# Patient Record
Sex: Female | Born: 1990
Health system: Southern US, Community
[De-identification: ages and names within clinical notes are randomized; demographics above are authoritative.]

## PROBLEM LIST (undated history)

## (undated) ENCOUNTER — Emergency Department (HOSPITAL_COMMUNITY)
Admission: RE | Disposition: A | Discharge status: Home / Self Care | Payer: BC Managed Care – PPO | Source: Ambulatory Visit | Attending: Emergency Medicine | Admitting: Emergency Medicine

## (undated) ENCOUNTER — Emergency Department (HOSPITAL_COMMUNITY): Discharge status: Absent without leave | Payer: No Typology Code available for payment source

## (undated) DIAGNOSIS — F32A Depression, unspecified: Secondary | ICD-10-CM

## (undated) DIAGNOSIS — J301 Allergic rhinitis due to pollen: Secondary | ICD-10-CM

## (undated) DIAGNOSIS — N390 Urinary tract infection, site not specified: Secondary | ICD-10-CM

## (undated) DIAGNOSIS — F329 Major depressive disorder, single episode, unspecified: Secondary | ICD-10-CM

## (undated) DIAGNOSIS — J45909 Unspecified asthma, uncomplicated: Secondary | ICD-10-CM

## (undated) DIAGNOSIS — A749 Chlamydial infection, unspecified: Secondary | ICD-10-CM

## (undated) HISTORY — DX: Allergic rhinitis due to pollen: J30.1

## (undated) HISTORY — PX: TONSILLECTOMY: SUR1361

## (undated) HISTORY — DX: Major depressive disorder, single episode, unspecified: F32.9

## (undated) HISTORY — DX: Chlamydial infection, unspecified: A74.9

## (undated) HISTORY — DX: Depression, unspecified: F32.A

---
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
## 2011-01-26 LAB — URINALYSIS, ROUTINE W REFLEX MICROSCOPIC
Bilirubin Urine: NEGATIVE
Glucose, UA: NEGATIVE
Ketones, ur: NEGATIVE
Leukocytes, UA: NEGATIVE
Nitrite: NEGATIVE
Protein, ur: NEGATIVE
Specific Gravity, Urine: 1.007
Urobilinogen, UA: 0.2
pH: 7.5

## 2011-01-26 LAB — URINE MICROSCOPIC-ADD ON

## 2011-01-26 LAB — BASIC METABOLIC PANEL
BUN: 9
CO2: 29
Calcium: 9.7
Chloride: 107
Creatinine, Ser: 0.55
Glucose, Bld: 92
Potassium: 3.8
Sodium: 144

## 2011-01-26 LAB — PREGNANCY, URINE: Preg Test, Ur: NEGATIVE

## 2011-04-04 ENCOUNTER — Emergency Department (HOSPITAL_COMMUNITY)
Admission: EM | Admit: 2011-04-04 | Discharge: 2011-04-05 | Disposition: A | Discharge status: Home / Self Care | Payer: Self-pay | Attending: Emergency Medicine | Admitting: Emergency Medicine

## 2011-04-04 DIAGNOSIS — N390 Urinary tract infection, site not specified: Secondary | ICD-10-CM | POA: Insufficient documentation

## 2011-04-04 DIAGNOSIS — R10819 Abdominal tenderness, unspecified site: Secondary | ICD-10-CM | POA: Insufficient documentation

## 2011-04-04 HISTORY — DX: Urinary tract infection, site not specified: N39.0

## 2011-04-05 ENCOUNTER — Encounter: Payer: Self-pay | Admitting: *Deleted

## 2011-04-05 LAB — URINALYSIS, ROUTINE W REFLEX MICROSCOPIC
Glucose, UA: NEGATIVE mg/dL
Ketones, ur: NEGATIVE mg/dL
Nitrite: NEGATIVE
Protein, ur: 30 mg/dL — AB
Urobilinogen, UA: 1 mg/dL (ref 0.0–1.0)

## 2011-04-05 LAB — URINE MICROSCOPIC-ADD ON

## 2011-04-05 MED ORDER — SULFAMETHOXAZOLE-TRIMETHOPRIM 800-160 MG PO TABS: 1.0000 | ORAL_TABLET | Freq: Two times a day (BID) | ORAL | Status: AC

## 2011-04-05 NOTE — ED Provider Notes (Signed)
History     CSN: [id number] Arrival date & time: 04/04/2011 11:08 PM   First MD Initiated Contact with Patient 04/05/11 0204      Chief Complaint  Patient presents with  . Urinary Tract Infection    (Consider location/radiation/quality/duration/timing/severity/associated sxs/prior treatment) HPI Comments: 20 year old female with a history of 3 days of dysuria, suprapubic pain. Symptoms are intermittent, persistent over time, not associated with fevers, chills, nausea, vomiting. She has taken no medications prior to arrival. She cites a history of having urinary infections as a child but nothing recently. Symptoms are mild at this time  Patient is a 20 y.o. female presenting with urinary tract infection. The history is provided by the patient and a relative.  Urinary Tract Infection    Past Medical History  Diagnosis Date  . UTI (urinary tract infection)     History reviewed. No pertinent past surgical history.  History reviewed. No pertinent family history.  History  Substance Use Topics  . Smoking status: Not on file  . Smokeless tobacco: Not on file  . Alcohol Use:     OB History    Grav Para Term Preterm Abortions TAB SAB Ect Mult Living                  Review of Systems  All other systems reviewed and are negative.    Allergies  Penicillins  Home Medications   Current Outpatient Rx  Name Route Sig Dispense Refill  . DIPHENHYDRAMINE-APAP (SLEEP) 25-500 MG PO TABS Oral Take 1 tablet by mouth at bedtime as needed.      Marland Kitchen MEDROXYPROGESTERONE ACETATE 150 MG/ML IM SUSP Intramuscular Inject 150 mg into the muscle every 3 (three) months.      . SULFAMETHOXAZOLE-TRIMETHOPRIM 800-160 MG PO TABS Oral Take 1 tablet by mouth 2 (two) times daily. 14 tablet 0    BP 108/45  Pulse 69  Temp(Src) 98 F (36.7 C) (Oral)  Resp 18  Ht 5\' 5"  (1.651 m)  Wt 169 lb (76.658 kg)  BMI 28.12 kg/m2  SpO2 100%  Physical Exam  Nursing note and vitals  reviewed. Constitutional: She appears well-developed and well-nourished. No distress.  HENT:  Head: Normocephalic and atraumatic.  Mouth/Throat: Oropharynx is clear and moist. No oropharyngeal exudate.  Eyes: Conjunctivae and EOM are normal. Pupils are equal, round, and reactive to light. Right eye exhibits no discharge. Left eye exhibits no discharge. No scleral icterus.  Neck: Normal range of motion. Neck supple. No JVD present. No thyromegaly present.  Cardiovascular: Normal rate, regular rhythm, normal heart sounds and intact distal pulses.  Exam reveals no gallop and no friction rub.   No murmur heard. Pulmonary/Chest: Effort normal and breath sounds normal. No respiratory distress. She has no wheezes. She has no rales.  Abdominal: Soft. Bowel sounds are normal. She exhibits no distension and no mass. There is tenderness ( Mild suprapubic tenderness).  Musculoskeletal: Normal range of motion. She exhibits no edema and no tenderness.  Lymphadenopathy:    She has no cervical adenopathy.  Neurological: She is alert. Coordination normal.  Skin: Skin is warm and dry. No rash noted. No erythema.  Psychiatric: She has a normal mood and affect. Her behavior is normal.    ED Course  Procedures (including critical care time)  Labs Reviewed  URINALYSIS, ROUTINE W REFLEX MICROSCOPIC - Abnormal; Notable for the following:    APPearance TURBID (*)    Protein, ur 30 (*)    Leukocytes, UA MODERATE (*)  All other components within normal limits  URINE MICROSCOPIC-ADD ON - Abnormal; Notable for the following:    Bacteria, UA MANY (*)    All other components within normal limits  POCT PREGNANCY, URINE  POCT PREGNANCY, URINE   No results found.   1. UTI (lower urinary tract infection)       MDM  UTI - no CVA ttp, no abn VS, bactrim X 7 days.        Vida Roller, MD 04/05/11 [phone]

## 2011-04-05 NOTE — ED Notes (Signed)
Pt has been experiencing UTI like symptoms x3 days that include burning, urgency, and pain with standing.

## 2011-09-27 ENCOUNTER — Encounter (HOSPITAL_BASED_OUTPATIENT_CLINIC_OR_DEPARTMENT_OTHER): Payer: Self-pay | Admitting: Emergency Medicine

## 2011-09-27 ENCOUNTER — Emergency Department (HOSPITAL_BASED_OUTPATIENT_CLINIC_OR_DEPARTMENT_OTHER)
Admission: EM | Admit: 2011-09-27 | Discharge: 2011-09-27 | Disposition: A | Discharge status: Home / Self Care | Payer: Self-pay | Attending: Emergency Medicine | Admitting: Emergency Medicine

## 2011-09-27 DIAGNOSIS — J069 Acute upper respiratory infection, unspecified: Secondary | ICD-10-CM | POA: Insufficient documentation

## 2011-09-27 DIAGNOSIS — F17200 Nicotine dependence, unspecified, uncomplicated: Secondary | ICD-10-CM | POA: Insufficient documentation

## 2011-09-27 DIAGNOSIS — J45909 Unspecified asthma, uncomplicated: Secondary | ICD-10-CM | POA: Insufficient documentation

## 2011-09-27 HISTORY — DX: Unspecified asthma, uncomplicated: J45.909

## 2011-09-27 LAB — BASIC METABOLIC PANEL
CO2: 23 mEq/L (ref 19–32)
Calcium: 8.9 mg/dL (ref 8.4–10.5)
GFR calc non Af Amer: >90 mL/min (ref >90)
Glucose, Bld: 88 mg/dL (ref 70–99)
Potassium: 3.1 mEq/L — ABNORMAL LOW (ref 3.5–5.1)
Sodium: 137 mEq/L (ref 135–145)

## 2011-09-27 MED ORDER — POTASSIUM CHLORIDE CRYS ER 20 MEQ PO TBCR
40.0000 meq | EXTENDED_RELEASE_TABLET | Freq: Once | ORAL | Status: AC
  Administered 2011-09-27: 40 meq via ORAL
  Filled 2011-09-27: qty 2

## 2011-09-27 MED ORDER — SODIUM CHLORIDE 0.9 % IV BOLUS (SEPSIS)
1000.0000 mL | Freq: Once | INTRAVENOUS | Status: AC
  Administered 2011-09-27: 1000 mL via INTRAVENOUS

## 2011-09-27 MED ORDER — KETOROLAC TROMETHAMINE 30 MG/ML IJ SOLN
30.0000 mg | Freq: Once | INTRAMUSCULAR | Status: AC
  Administered 2011-09-27: 30 mg via INTRAVENOUS
  Filled 2011-09-27: qty 1

## 2011-09-27 NOTE — ED Notes (Signed)
Runny nose, nasal congestion, fever, sore throat, headache, since yesterday.  No coughing.  Generalized fatigue.

## 2011-09-27 NOTE — ED Provider Notes (Signed)
History     CSN: [id number]  Arrival date & time 09/27/11  1043   First MD Initiated Contact with Patient 09/27/11 1110      Chief Complaint  Patient presents with  . Nasal Congestion  . Headache  . Fever    (Consider location/radiation/quality/duration/timing/severity/associated sxs/prior treatment) HPI Patient is a 21 yo female who presents with 24 hours of congestion, sore throat, myalgias, and headache.  She has no known sick contacts and rates her discomfort as a 5/10. Patient took Tylenol last night without much relief. She has had no medications today. She denies fevers. She has had some nausea but denies any currently. She denies vomiting, diarrhea, or significant cough. Patient has history of strep throat and says this does not feel exactly the same. She's having no difficulty with swallowing or breathing though she does have pain with swallowing. There are no other associated or modifying factors. Past Medical History  Diagnosis Date  . UTI (urinary tract infection)   . Asthma     Past Surgical History  Procedure Date  . Tonsillectomy     History reviewed. No pertinent family history.  History  Substance Use Topics  . Smoking status: Current Everyday Smoker  . Smokeless tobacco: Not on file  . Alcohol Use: No    OB History    Grav Para Term Preterm Abortions TAB SAB Ect Mult Living                  Review of Systems  Constitutional: Positive for fatigue.  HENT: Positive for congestion and sore throat.   Eyes: Negative.   Respiratory: Negative.   Cardiovascular: Negative.   Gastrointestinal: Positive for nausea.  Genitourinary: Negative.   Musculoskeletal: Positive for myalgias.  Skin: Negative.   Neurological: Negative.   Hematological: Negative.   Psychiatric/Behavioral: Negative.   All other systems reviewed and are negative.    Allergies  Penicillins  Home Medications   Current Outpatient Rx  Name Route Sig Dispense Refill  .  ACETAMINOPHEN 325 MG PO TABS Oral Take 650 mg by mouth every 6 (six) hours as needed.    . ALBUTEROL SULFATE HFA 108 (90 BASE) MCG/ACT IN AERS Inhalation Inhale 2 puffs into the lungs every 6 (six) hours as needed.    Marland Kitchen DIPHENHYDRAMINE-APAP (SLEEP) 25-500 MG PO TABS Oral Take 1 tablet by mouth at bedtime as needed.      Marland Kitchen MEDROXYPROGESTERONE ACETATE 150 MG/ML IM SUSP Intramuscular Inject 150 mg into the muscle every 3 (three) months.        BP 110/58  Pulse 100  Temp(Src) 99.9 F (37.7 C) (Oral)  Resp 18  SpO2 100%  Physical Exam  Nursing note and vitals reviewed. GEN: Well-developed, well-nourished female in no distress, uncomfortable appearing HEENT: Atraumatic, normocephalic. Oropharynx clear with erythema but no obvious exudates. Nasal congestion noted bilaterally. EYES: PERRLA BL, no scleral icterus. NECK: Trachea midline, no meningismus CV: regular rate and rhythm. No murmurs, rubs, or gallops PULM: No respiratory distress.  No crackles, wheezes, or rales. GI: soft, non-tender. No guarding, rebound, or tenderness. + bowel sounds  GU: deferred Neuro: cranial nerves 2-12 intact, no abnormalities of strength or sensation, A and O x 3 MSK: Patient moves all 4 extremities symmetrically, no deformity, edema, or injury noted Skin: No rashes petechiae, purpura, or jaundice Psych: no abnormality of mood   ED Course  Procedures (including critical care time)  Labs Reviewed  BASIC METABOLIC PANEL - Abnormal; Notable for the following:  Potassium 3.1 (*)    BUN 5 (*)    All other components within normal limits  RAPID STREP SCREEN   No results found.   1. Acute URI       MDM  Patient was evaluated by myself. Based on her evaluation I did perform a strep test given how rapidly her symptoms came on and her sore throat. This was negative. Patient did receive IV fluid as well as Toradol for her symptoms. I confirmed her renal function was satisfactory. Patient had mild  hypokalemia and this was replaced orally. Patient was feeling much better after a liter of fluid. She was discharged in good condition. Patient was told symptoms may last 7-10 days and that she should take over-the-counter ibuprofen and Tylenol for this. She was also encouraged to drink plenty of fluids. She is welcome to return for any other emergent concerns.        Cyndra Numbers, MD 09/27/11 1251

## 2011-09-27 NOTE — Discharge Instructions (Signed)

## 2011-10-03 ENCOUNTER — Encounter (HOSPITAL_BASED_OUTPATIENT_CLINIC_OR_DEPARTMENT_OTHER): Payer: Self-pay

## 2011-10-03 ENCOUNTER — Emergency Department (HOSPITAL_BASED_OUTPATIENT_CLINIC_OR_DEPARTMENT_OTHER)
Admission: EM | Admit: 2011-10-03 | Discharge: 2011-10-03 | Disposition: A | Discharge status: Home / Self Care | Payer: Self-pay | Attending: Emergency Medicine | Admitting: Emergency Medicine

## 2011-10-03 DIAGNOSIS — J45909 Unspecified asthma, uncomplicated: Secondary | ICD-10-CM | POA: Insufficient documentation

## 2011-10-03 DIAGNOSIS — Z880 Allergy status to penicillin: Secondary | ICD-10-CM | POA: Insufficient documentation

## 2011-10-03 DIAGNOSIS — R040 Epistaxis: Secondary | ICD-10-CM | POA: Insufficient documentation

## 2011-10-03 DIAGNOSIS — E876 Hypokalemia: Secondary | ICD-10-CM

## 2011-10-03 DIAGNOSIS — J3489 Other specified disorders of nose and nasal sinuses: Secondary | ICD-10-CM | POA: Insufficient documentation

## 2011-10-03 DIAGNOSIS — F17200 Nicotine dependence, unspecified, uncomplicated: Secondary | ICD-10-CM | POA: Insufficient documentation

## 2011-10-03 LAB — BASIC METABOLIC PANEL
CO2: 26 mEq/L (ref 19–32)
Chloride: 104 mEq/L (ref 96–112)
Glucose, Bld: 73 mg/dL (ref 70–99)
Potassium: 3.1 mEq/L — ABNORMAL LOW (ref 3.5–5.1)
Sodium: 138 mEq/L (ref 135–145)

## 2011-10-03 LAB — RAPID STREP SCREEN (MED CTR MEBANE ONLY): Streptococcus, Group A Screen (Direct): NEGATIVE

## 2011-10-03 LAB — DIFFERENTIAL
Basophils Relative: 0 % (ref 0–1)
Eosinophils Relative: 3 % (ref 0–5)
Monocytes Relative: 10 % (ref 3–12)
Neutrophils Relative %: 49 % (ref 43–77)

## 2011-10-03 LAB — MONONUCLEOSIS SCREEN: Mono Screen: NEGATIVE

## 2011-10-03 LAB — CBC
Hemoglobin: 11.8 g/dL — ABNORMAL LOW (ref 12.0–15.0)
Platelets: 282 10*3/uL (ref 150–400)
RBC: 3.65 MIL/uL — ABNORMAL LOW (ref 3.87–5.11)
WBC: 9.5 10*3/uL (ref 4.0–10.5)

## 2011-10-03 LAB — PREGNANCY, URINE: Preg Test, Ur: NEGATIVE

## 2011-10-03 MED ORDER — POTASSIUM CHLORIDE CRYS ER 20 MEQ PO TBCR
40.0000 meq | EXTENDED_RELEASE_TABLET | Freq: Once | ORAL | Status: AC
  Administered 2011-10-03: 40 meq via ORAL
  Filled 2011-10-03: qty 2

## 2011-10-03 NOTE — Discharge Instructions (Signed)
Hypokalemia Hypokalemia means a low potassium level in the blood.Potassium is an electrolyte that helps regulate the amount of fluid in the body. It also stimulates muscle contraction and maintains a stable acid-base balance.Most of the body's potassium is inside of cells, and only a very small amount is in the blood. Because the amount in the blood is so small, minor changes can have big effects. PREPARATION FOR TEST Testing for potassium requires taking a blood sample taken by needle from a vein in the arm. The skin is cleaned thoroughly before the sample is drawn. There is no other special preparation needed. NORMAL VALUES Potassium levels below 3.5 mEq/L are abnormally low. Levels above 5.1 mEq/L are abnormally high. Ranges for normal findings may vary among different laboratories and hospitals. You should always check with your doctor after having lab work or other tests done to discuss the meaning of your test results and whether your values are considered within normal limits. MEANING OF TEST  Your caregiver will go over the test results with you and discuss the importance and meaning of your results, as well as treatment options and the need for additional tests, if necessary. A potassium level is frequently part of a routine medical exam. It is usually included as part of a whole "panel" of tests for several blood salts (such as Sodium and Chloride). It may be done as part of follow-up when a low potassium level was found in the past or other blood salts are suspected of being out of balance. A low potassium level might be suspected if you have one or more of the following:  Symptoms of weakness.   Abnormal heart rhythms.   High blood pressure and are taking medication to control this, especially water pills (diuretics).   Kidney disease that can affect your potassium level .   Diabetes requiring the use of insulin. The potassium may fall after taking insulin, especially if the  diabetes had been out of control for a while.   A condition requiring the use of cortisone-type medication or certain types of antibiotics.   Vomiting and/or diarrhea for more than a day or two.   A stomach or intestinal condition that may not permit appropriate absorption of potassium.   Fainting episodes.   Mental confusion.  OBTAINING TEST RESULTS It is your responsibility to obtain your test results. Ask the lab or department performing the test when and how you will get your results.  Please contact your caregiver directly if you have not received the results within one week. At that time, ask if there is anything different or new you should be doing in relation to the results. TREATMENT Hypokalemia can be treated with potassium supplements taken by mouth and/or adjustments in your current medications. A diet high in potassium is also helpful. Foods with high potassium content are:  Peas, lentils, lima beans, nuts, and dried fruit.   Whole grain and bran cereals and breads.   Fresh fruit, vegetables (bananas, cantaloupe, grapefruit, oranges, tomatoes, honeydew melons, potatoes).   Orange and tomato juices.   Meats. If potassium supplement has been prescribed for you today or your medications have been adjusted, see your personal caregiver in time02 for a re-check.  SEEK MEDICAL CARE IF:  There is a feeling of worsening weakness.   You experience repeated chest palpitations.   You are diabetic and having difficulty keeping your blood sugars in the normal range.   You are experiencing vomiting and/or diarrhea.   You are having  difficulty with any of your regular medications.  SEEK IMMEDIATE MEDICAL CARE IF:  You experience chest pain, shortness of breath, or episodes of dizziness.   You have been having vomiting or diarrhea for more than 2 days.   You have a fainting episode.  MAKE SURE YOU:   Understand these instructions.   Will watch your condition.   Will  get help right away if you are not doing well or get worse.  Document Released: 04/06/2005 Document Revised: 03/26/2011 Document Reviewed: 03/17/2008 [facility] Patient Information 2012 [place], [place].Nosebleed A nosebleed can be caused by many things, including:  Getting hit hard in the nose.   Infections.   Dry nose.   Colds.   Medicines.  Your doctor may do lab testing if you get nosebleeds a lot and the cause is not known. HOME CARE   If your nose was packed with material, keep it there until your doctor takes it out. Put the pack back in your nose if the pack falls out.   Do not blow your nose for 12 hours after the nosebleed.   Sit up and bend forward if your nose starts bleeding again. Pinch the front half of your nose nonstop for 20 minutes.   Put petroleum jelly inside your nose every morning if you have a dry nose.   Use a humidifier to make the air less dry.   Do not take aspirin.   Try not to strain, lift, or bend at the waist for many days after the nosebleed.  GET HELP RIGHT AWAY IF:   Nosebleeds keep happening and are hard to stop or control.   You have bleeding or bruises that are not normal on other parts of the body.   You have a fever.   The nosebleeds get worse.   You get lightheaded, feel faint, sweaty, or throw up (vomit) blood.  MAKE SURE YOU:   Understand these instructions.   Will watch your condition.   Will get help right away if you are not doing well or get worse.  Document Released: 01/14/2008 Document Revised: 03/26/2011 Document Reviewed: 01/14/2008 [facility] Patient Information 2012 [place], [place].

## 2011-10-03 NOTE — ED Notes (Signed)
Pt sts had loose stool for month was Hx viral infection times one week. Nose bleed for two days in AM

## 2011-10-03 NOTE — ED Provider Notes (Signed)
History     CSN: [id number]  Arrival date & time 10/03/11  0154   First MD Initiated Contact with Patient 10/03/11 0208      Chief Complaint  Patient presents with  . Weakness    (Consider location/radiation/quality/duration/timing/severity/associated sxs/prior treatment) Patient is a 21 y.o. female presenting with nosebleeds. The history is provided by the patient and a parent.  Epistaxis  This is a new problem. The current episode started 12 to 24 hours ago. The problem occurs rarely. The problem has been resolved. The problem is associated with an unknown factor. The bleeding has been from the left nare. She has tried nothing for the symptoms. The treatment provided significant relief. Her past medical history is significant for colds. Her past medical history does not include bleeding disorder, nose-picking or frequent nosebleeds.  Also complains of fatigue and loose stools for greater than one month.  No f/c/r.  No neck pain or stiffness.  No travel.  No cp , no sob no n/v.  No rashes.  No easy bruising.  No lymph gland swelling.  Was seen for similar symptoms a week ago and told she had a virus.  Has ongoing nasal congestion.   Past Medical History  Diagnosis Date  . UTI (urinary tract infection)   . Asthma     Past Surgical History  Procedure Date  . Tonsillectomy     History reviewed. No pertinent family history.  History  Substance Use Topics  . Smoking status: Current Everyday Smoker  . Smokeless tobacco: Not on file  . Alcohol Use: No    OB History    Grav Para Term Preterm Abortions TAB SAB Ect Mult Living                  Review of Systems  Constitutional: Positive for fatigue. Negative for chills, activity change, appetite change and unexpected weight change.  HENT: Positive for nosebleeds and congestion. Negative for mouth sores and neck pain.   Hematological: Negative for adenopathy. Does not bruise/bleed easily.  All other systems reviewed and are  negative.    Allergies  Penicillins  Home Medications   Current Outpatient Rx  Name Route Sig Dispense Refill  . ACETAMINOPHEN 325 MG PO TABS Oral Take 650 mg by mouth every 6 (six) hours as needed.     . ALBUTEROL SULFATE HFA 108 (90 BASE) MCG/ACT IN AERS Inhalation Inhale 2 puffs into the lungs every 6 (six) hours as needed.    Marland Kitchen DIPHENHYDRAMINE-APAP (SLEEP) 25-500 MG PO TABS Oral Take 1 tablet by mouth at bedtime as needed.      Marland Kitchen MEDROXYPROGESTERONE ACETATE 150 MG/ML IM SUSP Intramuscular Inject 150 mg into the muscle every 3 (three) months.        BP 116/67  Pulse 81  Temp 98.2 F (36.8 C) (Oral)  Resp 20  Ht 5\' 5"  (1.651 m)  Wt 167 lb (75.751 kg)  BMI 27.79 kg/m2  SpO2 100%  Physical Exam  Constitutional: She is oriented to person, place, and time. She appears well-developed and well-nourished. No distress.  HENT:  Head: Normocephalic and atraumatic.  Right Ear: No mastoid tenderness. Tympanic membrane is not injected.  Left Ear: No mastoid tenderness. Tympanic membrane is not injected.  Nose: No nose lacerations. No epistaxis.  Mouth/Throat: Oropharynx is clear and moist.       Boggy turbinates  Posterior oropharyngeal cobblestoning  Eyes: Conjunctivae are normal. Pupils are equal, round, and reactive to light.  Neck: Normal range  of motion. Neck supple.  Cardiovascular: Normal rate, regular rhythm and intact distal pulses.   Pulmonary/Chest: Effort normal. She has no wheezes. She has no rales.  Abdominal: Soft. Bowel sounds are normal. There is no tenderness. There is no rebound and no guarding.  Musculoskeletal: Normal range of motion. She exhibits no edema and no tenderness.  Lymphadenopathy:    She has no cervical adenopathy.  Neurological: She is alert and oriented to person, place, and time. She has normal reflexes.  Skin: Skin is warm and dry. No rash noted. No erythema. No pallor.       No bruising no petechia no puprura  Psychiatric: She has a normal  mood and affect.    ED Course  Procedures (including critical care time)   Labs Reviewed  CBC  DIFFERENTIAL  BASIC METABOLIC PANEL  MONONUCLEOSIS SCREEN  RAPID STREP SCREEN  PREGNANCY, URINE   No results found.   No diagnosis found.    MDM  Today's exam and labs are reassuring. No indication for imaging at this time. Espitaxis likely secondary to nasal congestion from viral infection. Today's exam is to rule out life threatening medical conditions.  Other chronic conditions may exist and require close care by a family physician or internist.  Patient and mother informed that due to the chronic nature of her concerns she needs to see a family physician for full physical exam and testing as they deem appropriate.  She had had multiple concerns for several months time all of which require ongoing care.   Add a multi vitamin to your diet.  Patient and mother verbalize understanding and agree to follow up.  Return for intractable nose bleeds       Kalaysia Demonbreun K Aalaiyah Yassin-Rasch, MD 10/03/11 [phone]

## 2011-11-04 ENCOUNTER — Encounter (HOSPITAL_BASED_OUTPATIENT_CLINIC_OR_DEPARTMENT_OTHER): Payer: Self-pay

## 2011-11-04 ENCOUNTER — Emergency Department (HOSPITAL_BASED_OUTPATIENT_CLINIC_OR_DEPARTMENT_OTHER)
Admission: EM | Admit: 2011-11-04 | Discharge: 2011-11-04 | Disposition: A | Discharge status: Home / Self Care | Payer: Self-pay | Attending: Emergency Medicine | Admitting: Emergency Medicine

## 2011-11-04 DIAGNOSIS — J45909 Unspecified asthma, uncomplicated: Secondary | ICD-10-CM | POA: Insufficient documentation

## 2011-11-04 DIAGNOSIS — A64 Unspecified sexually transmitted disease: Secondary | ICD-10-CM | POA: Insufficient documentation

## 2011-11-04 DIAGNOSIS — F17200 Nicotine dependence, unspecified, uncomplicated: Secondary | ICD-10-CM | POA: Insufficient documentation

## 2011-11-04 LAB — URINALYSIS, ROUTINE W REFLEX MICROSCOPIC
Hgb urine dipstick: NEGATIVE
Nitrite: NEGATIVE
Protein, ur: NEGATIVE mg/dL
Urobilinogen, UA: 0.2 mg/dL (ref 0.0–1.0)

## 2011-11-04 LAB — PREGNANCY, URINE: Preg Test, Ur: NEGATIVE

## 2011-11-04 LAB — WET PREP, GENITAL

## 2011-11-04 LAB — URINE MICROSCOPIC-ADD ON

## 2011-11-04 MED ORDER — FLUCONAZOLE 50 MG PO TABS
150.0000 mg | ORAL_TABLET | Freq: Once | ORAL | Status: AC
  Administered 2011-11-04: 150 mg via ORAL
  Filled 2011-11-04: qty 1

## 2011-11-04 MED ORDER — AZITHROMYCIN 250 MG PO TABS
1000.0000 mg | ORAL_TABLET | Freq: Once | ORAL | Status: AC
  Administered 2011-11-04: 1000 mg via ORAL
  Filled 2011-11-04: qty 4

## 2011-11-04 MED ORDER — LIDOCAINE HCL (PF) 1 % IJ SOLN
INTRAMUSCULAR | Status: AC
  Administered 2011-11-04: 2 mL
  Filled 2011-11-04: qty 5

## 2011-11-04 MED ORDER — CEFTRIAXONE SODIUM 250 MG IJ SOLR
250.0000 mg | Freq: Once | INTRAMUSCULAR | Status: AC
  Administered 2011-11-04: 250 mg via INTRAMUSCULAR
  Filled 2011-11-04: qty 250

## 2011-11-04 NOTE — ED Provider Notes (Addendum)
History     CSN: [id number]  Arrival date & time 11/04/11  1730   First MD Initiated Contact with Patient 11/04/11 1820      Chief Complaint  Patient presents with  . Dysuria  . Vaginal Discharge    (Consider location/radiation/quality/duration/timing/severity/associated sxs/prior treatment) Patient is a 21 y.o. female presenting with dysuria and vaginal discharge. The history is provided by the patient.  Dysuria  This is a new problem. Episode onset: about 1 month ago. The problem occurs intermittently. The problem has been gradually worsening. The quality of the pain is described as burning and stabbing. The pain is at a severity of 8/10. The pain is moderate. There has been no fever. She is sexually active (2 new partners.  one unprotected). There is no history of pyelonephritis. Associated symptoms include discharge and frequency. Pertinent negatives include no nausea, no vomiting, no hesitancy and no flank pain. Treatments tried: monistat. Her past medical history does not include recurrent UTIs.  Vaginal Discharge This is a recurrent problem. Episode onset: 68month. The problem occurs constantly. The problem has been gradually worsening. Pertinent negatives include no chest pain, no abdominal pain and no shortness of breath. Exacerbated by: urinating. She has tried nothing for the symptoms. The treatment provided no relief.    Past Medical History  Diagnosis Date  . UTI (urinary tract infection)   . Asthma     Past Surgical History  Procedure Date  . Tonsillectomy     No family history on file.  History  Substance Use Topics  . Smoking status: Current Everyday Smoker  . Smokeless tobacco: Not on file  . Alcohol Use: No    OB History    Grav Para Term Preterm Abortions TAB SAB Ect Mult Living                  Review of Systems  Respiratory: Negative for shortness of breath.   Cardiovascular: Negative for chest pain.  Gastrointestinal: Negative for nausea,  vomiting and abdominal pain.  Genitourinary: Positive for dysuria, frequency and vaginal discharge. Negative for hesitancy and flank pain.  All other systems reviewed and are negative.    Allergies  Penicillins  Home Medications   Current Outpatient Rx  Name Route Sig Dispense Refill  . MEDROXYPROGESTERONE ACETATE 150 MG/ML IM SUSP Intramuscular Inject 150 mg into the muscle every 3 (three) months.        BP 110/60  Pulse 80  Temp 98.3 F (36.8 C) (Oral)  Resp 16  Ht 5\' 5"  (1.651 m)  Wt 167 lb (75.751 kg)  BMI 27.79 kg/m2  SpO2 100%  Physical Exam  Nursing note and vitals reviewed. Constitutional: She is oriented to person, place, and time. She appears well-developed and well-nourished. No distress.  HENT:  Head: Normocephalic and atraumatic.  Mouth/Throat: Oropharynx is clear and moist.  Eyes: Conjunctivae and EOM are normal. Pupils are equal, round, and reactive to light.  Neck: Normal range of motion. Neck supple.  Cardiovascular: Normal rate, regular rhythm and intact distal pulses.   No murmur heard. Pulmonary/Chest: Effort normal and breath sounds normal. No respiratory distress. She has no wheezes. She has no rales.  Abdominal: Soft. Normal appearance. She exhibits no distension. There is no tenderness. There is no rebound, no guarding and no CVA tenderness.  Genitourinary: Vaginal discharge found.       Mild diffuse pelvic tenderness  Musculoskeletal: Normal range of motion. She exhibits no edema and no tenderness.  Neurological: She is alert  and oriented to person, place, and time.  Skin: Skin is warm and dry. No rash noted. No erythema.  Psychiatric: She has a normal mood and affect. Her behavior is normal.    ED Course  Procedures (including critical care time)  Labs Reviewed  URINALYSIS, ROUTINE W REFLEX MICROSCOPIC - Abnormal; Notable for the following:    Ketones, ur 15 (*)     Leukocytes, UA SMALL (*)     All other components within normal limits    WET PREP, GENITAL - Abnormal; Notable for the following:    Clue Cells Wet Prep HPF POC MODERATE (*)     WBC, Wet Prep HPF POC MODERATE (*)  MANY BACTERIA SEEN   All other components within normal limits  URINE MICROSCOPIC-ADD ON - Abnormal; Notable for the following:    Squamous Epithelial / LPF FEW (*)     Bacteria, UA MANY (*)     All other components within normal limits  PREGNANCY, URINE  GC/CHLAMYDIA PROBE AMP, GENITAL   No results found.   1. STD (female)       MDM   Patient with vaginal burning and itching as well as intermittent dysuria for the last 3-4 weeks. She denies any systemic symptoms concerning for pyelonephritis. She has had 2 new sexual partners 1 unprotected in the last month. Patient states UPT is negative. UA showed many bacteria and a small amount of leukocytes. On pelvic exam she has mild diffuse tenderness but no signs of PID.  Wet prep pending.  7:16 PM A large amount of white blood cells and many bacteria seen on wet prep. Patient was treated with Rocephin in his throat. She was also given Diflucan.       Gwyneth Sprout, MD 11/04/11 1916  Gwyneth Sprout, MD 11/04/11 [id number]

## 2011-11-04 NOTE — ED Notes (Signed)
No reaction to ABX. Pt dc'd home.

## 2011-11-04 NOTE — ED Notes (Signed)
Dysuria, vag d/c x 3-4 weeks

## 2012-02-21 ENCOUNTER — Encounter (HOSPITAL_BASED_OUTPATIENT_CLINIC_OR_DEPARTMENT_OTHER): Payer: Self-pay | Admitting: *Deleted

## 2012-02-21 ENCOUNTER — Emergency Department (HOSPITAL_BASED_OUTPATIENT_CLINIC_OR_DEPARTMENT_OTHER)
Admission: EM | Admit: 2012-02-21 | Discharge: 2012-02-21 | Disposition: A | Discharge status: Home / Self Care | Payer: Self-pay | Attending: Emergency Medicine | Admitting: Emergency Medicine

## 2012-02-21 ENCOUNTER — Emergency Department (HOSPITAL_BASED_OUTPATIENT_CLINIC_OR_DEPARTMENT_OTHER): Payer: Self-pay

## 2012-02-21 DIAGNOSIS — IMO0002 Reserved for concepts with insufficient information to code with codable children: Secondary | ICD-10-CM | POA: Insufficient documentation

## 2012-02-21 DIAGNOSIS — J45909 Unspecified asthma, uncomplicated: Secondary | ICD-10-CM | POA: Insufficient documentation

## 2012-02-21 DIAGNOSIS — J40 Bronchitis, not specified as acute or chronic: Secondary | ICD-10-CM | POA: Insufficient documentation

## 2012-02-21 DIAGNOSIS — Z87440 Personal history of urinary (tract) infections: Secondary | ICD-10-CM | POA: Insufficient documentation

## 2012-02-21 DIAGNOSIS — M545 Low back pain, unspecified: Secondary | ICD-10-CM | POA: Insufficient documentation

## 2012-02-21 DIAGNOSIS — R059 Cough, unspecified: Secondary | ICD-10-CM | POA: Insufficient documentation

## 2012-02-21 DIAGNOSIS — Z79899 Other long term (current) drug therapy: Secondary | ICD-10-CM | POA: Insufficient documentation

## 2012-02-21 DIAGNOSIS — R05 Cough: Secondary | ICD-10-CM | POA: Insufficient documentation

## 2012-02-21 DIAGNOSIS — H9209 Otalgia, unspecified ear: Secondary | ICD-10-CM | POA: Insufficient documentation

## 2012-02-21 DIAGNOSIS — F17200 Nicotine dependence, unspecified, uncomplicated: Secondary | ICD-10-CM | POA: Insufficient documentation

## 2012-02-21 MED ORDER — PREDNISONE 10 MG PO TABS: 40.0000 mg | ORAL_TABLET | Freq: Every day | ORAL | Status: DC

## 2012-02-21 MED ORDER — HYDROCODONE-HOMATROPINE 5-1.5 MG/5ML PO SYRP: 5.0000 mL | ORAL_SOLUTION | Freq: Four times a day (QID) | ORAL | Status: DC | PRN

## 2012-02-21 MED ORDER — ALBUTEROL SULFATE HFA 108 (90 BASE) MCG/ACT IN AERS: 1.0000 | INHALATION_SPRAY | Freq: Four times a day (QID) | RESPIRATORY_TRACT | Status: DC | PRN

## 2012-02-21 NOTE — ED Provider Notes (Signed)
History   This chart was scribed for Rolan Bucco, MD by Albertha Ghee Rifaie. This patient was seen in room MHOTF/OTF and the patient's care was started at 9:43 PM.    CSN: [id number]  Arrival date & time 02/21/12  2058   None     Chief Complaint  Patient presents with  . Sore Throat     The history is provided by the patient. No language interpreter was used.   Tammy Knight is a 21 y.o. female who presents to the Emergency Department complaining of gradually worsening, moderate, constant sore throat, and productive cough with yellow mucus that started yesterday. There is associated fever, loss appetite, ear pain, eyes are sensitive to light, chills and generalized weakness. Dry cough noted on exam. Patient also complains of lower back pain. Pt denies fever, nausea, emesis, vomiting, and diarrhea. She has a history of asthma and uses albuterol inhaler. Patient is a current everyday smoker but denies alcohol use.         Past Medical History  Diagnosis Date  . UTI (urinary tract infection)   . Asthma     Past Surgical History  Procedure Date  . Tonsillectomy     No family history on file.  History  Substance Use Topics  . Smoking status: Current Every Day Smoker  . Smokeless tobacco: Not on file  . Alcohol Use: No   No OB history provided  Review of Systems  Constitutional: Negative for fever, chills, diaphoresis and fatigue.  HENT: Positive for ear pain and sore throat. Negative for congestion, rhinorrhea and sneezing.   Eyes: Negative.        Sensitive to light.  Respiratory: Positive for cough (productive). Negative for chest tightness and shortness of breath.   Cardiovascular: Negative for chest pain and leg swelling.  Gastrointestinal: Negative for nausea, vomiting, abdominal pain, diarrhea and blood in stool.  Genitourinary: Negative for frequency, hematuria, flank pain and difficulty urinating.  Musculoskeletal: Negative for back pain and arthralgias.    Skin: Negative for rash.  Neurological: Positive for weakness. Negative for dizziness, speech difficulty, numbness and headaches.    Allergies  Penicillins  Home Medications   Current Outpatient Rx  Name  Route  Sig  Dispense  Refill  . ALBUTEROL SULFATE HFA 108 (90 BASE) MCG/ACT IN AERS   Inhalation   Inhale 1-2 puffs into the lungs every 6 (six) hours as needed for wheezing.   1 Inhaler   0   . HYDROCODONE-HOMATROPINE 5-1.5 MG/5ML PO SYRP   Oral   Take 5 mLs by mouth every 6 (six) hours as needed for cough.   120 mL   0   . MEDROXYPROGESTERONE ACETATE 150 MG/ML IM SUSP   Intramuscular   Inject 150 mg into the muscle every 3 (three) months.           Marland Kitchen PREDNISONE 10 MG PO TABS   Oral   Take 4 tablets (40 mg total) by mouth daily.   20 tablet   0     BP 117/61  Pulse 105  Temp 98.8 F (37.1 C) (Oral)  Resp 20  SpO2 100%  Physical Exam  Constitutional: She is oriented to person, place, and time. She appears well-developed and well-nourished.  HENT:  Head: Normocephalic and atraumatic.  Right Ear: External ear normal.  Left Ear: External ear normal.  Mouth/Throat: Oropharynx is clear and moist.  Eyes: Pupils are equal, round, and reactive to light.  Neck: Normal range of motion. Neck  supple.  Cardiovascular: Normal rate, regular rhythm and normal heart sounds.   Pulmonary/Chest: Effort normal and breath sounds normal. No respiratory distress. She has no wheezes. She has no rales. She exhibits no tenderness.       Mild expiratory wheezes.  Abdominal: Soft. Bowel sounds are normal. There is no tenderness. There is no rebound and no guarding.  Musculoskeletal: Normal range of motion. She exhibits no edema.  Lymphadenopathy:    She has no cervical adenopathy.  Neurological: She is alert and oriented to person, place, and time.  Skin: Skin is warm and dry. No rash noted.  Psychiatric: She has a normal mood and affect.    ED Course  Procedures (including  critical care time)  DIAGNOSTIC STUDIES: Oxygen Saturation is 100% on room air, normal by my interpretation.    COORDINATION OF CARE: 9:45 PM Discussed treatment plan with pt at bedside and pt agreed to plan.   Labs Reviewed - No data to display Dg Chest 2 View  02/21/2012  *RADIOLOGY REPORT*  Clinical Data: Sore throat  CHEST - 2 VIEW  Comparison: March 29, 2010.  Findings:  Lungs clear.  Heart size and pulmonary vascularity are normal.  No adenopathy.  No bone lesions.  IMPRESSION: Lungs clear.   Original Report Authenticated By: Bretta Bang, M.D.      1. Bronchitis       MDM  Pt started on prednisone, cough med.  Will continue her albuterol inhaler.  No evidence of pneumonia.  Pt well appearing with no increased WOB or hypoxia.      I personally performed the services described in this documentation, which was scribed in my presence.  The recorded information has been reviewed and considered.    Rolan Bucco, MD 02/21/12 [phone]

## 2012-02-21 NOTE — ED Notes (Signed)
Pt. States she has had cough (Yellow in color), fever, sorethroat, c/o general weakness. Dry cough noted on exam. Faint  insp wheezing noted to all left lung fields posterior. C/o pain to left lower back area. resp even and unlabored.

## 2012-04-20 HISTORY — PX: ABDOMINOPLASTY: SUR9

## 2012-05-01 ENCOUNTER — Emergency Department (HOSPITAL_COMMUNITY)
Admission: EM | Admit: 2012-05-01 | Discharge: 2012-05-01 | Disposition: A | Discharge status: Home / Self Care | Payer: Self-pay | Attending: Emergency Medicine | Admitting: Emergency Medicine

## 2012-05-01 ENCOUNTER — Emergency Department (HOSPITAL_COMMUNITY): Payer: Self-pay

## 2012-05-01 ENCOUNTER — Encounter (HOSPITAL_COMMUNITY): Payer: Self-pay | Admitting: Emergency Medicine

## 2012-05-01 DIAGNOSIS — J069 Acute upper respiratory infection, unspecified: Secondary | ICD-10-CM | POA: Insufficient documentation

## 2012-05-01 DIAGNOSIS — F17200 Nicotine dependence, unspecified, uncomplicated: Secondary | ICD-10-CM | POA: Insufficient documentation

## 2012-05-01 DIAGNOSIS — Z880 Allergy status to penicillin: Secondary | ICD-10-CM | POA: Insufficient documentation

## 2012-05-01 DIAGNOSIS — J45909 Unspecified asthma, uncomplicated: Secondary | ICD-10-CM | POA: Insufficient documentation

## 2012-05-01 DIAGNOSIS — J029 Acute pharyngitis, unspecified: Secondary | ICD-10-CM | POA: Insufficient documentation

## 2012-05-01 DIAGNOSIS — Z87440 Personal history of urinary (tract) infections: Secondary | ICD-10-CM | POA: Insufficient documentation

## 2012-05-01 NOTE — ED Notes (Signed)
Pt c/o headache, body aches, and sore throat since yesterday.

## 2012-05-01 NOTE — ED Provider Notes (Signed)
Medical screening examination/treatment/procedure(s) were performed by non-physician practitioner and as supervising physician I was immediately available for consultation/collaboration.   Gwyneth Sprout, MD 05/01/12 1521

## 2012-05-01 NOTE — ED Provider Notes (Signed)
History     CSN: [id number]  Arrival date & time 05/01/12  1320   First MD Initiated Contact with Patient 05/01/12 1338      Chief Complaint  Patient presents with  . Headache  . Sore Throat    (Consider location/radiation/quality/duration/timing/severity/associated sxs/prior treatment) Patient is a 22 y.o. female presenting with pharyngitis. The history is provided by the patient.  Sore Throat This is a new problem. The current episode started yesterday. The problem occurs constantly. Associated symptoms include chills, congestion, coughing, a fever, headaches, myalgias, neck pain and a sore throat. Pertinent negatives include no abdominal pain, nausea or rash. Associated symptoms comments: Mild sore throat, cough, night sweats and congestion for 24 hours. No one else at home is ill. She states her appetite is decreased but she continues to drink fluids without difficulty with swallowing. . Nothing aggravates the symptoms.    Past Medical History  Diagnosis Date  . UTI (urinary tract infection)   . Asthma     Past Surgical History  Procedure Date  . Tonsillectomy     History reviewed. No pertinent family history.  History  Substance Use Topics  . Smoking status: Current Every Day Smoker  . Smokeless tobacco: Not on file  . Alcohol Use: No    OB History    Grav Para Term Preterm Abortions TAB SAB Ect Mult Living                  Review of Systems  Constitutional: Positive for fever and chills.  HENT: Positive for congestion, sore throat and neck pain.   Respiratory: Positive for cough.   Cardiovascular: Negative.   Gastrointestinal: Negative.  Negative for nausea and abdominal pain.  Musculoskeletal: Positive for myalgias.  Skin: Negative.  Negative for rash.  Neurological: Positive for headaches.    Allergies  Penicillins  Home Medications   Current Outpatient Rx  Name  Route  Sig  Dispense  Refill  . ACETAMINOPHEN ER 650 MG PO TBCR   Oral   Take  650 mg by mouth every 8 (eight) hours as needed. pain           BP 109/56  Pulse 81  Temp 98.3 F (36.8 C) (Oral)  Resp 18  SpO2 99%  Physical Exam  Constitutional: She is oriented to person, place, and time. She appears well-developed and well-nourished.  HENT:  Head: Normocephalic.  Right Ear: External ear normal.  Left Ear: External ear normal.  Nose: Mucosal edema present. Right sinus exhibits frontal sinus tenderness. Left sinus exhibits frontal sinus tenderness.  Mouth/Throat: Posterior oropharyngeal erythema present. No posterior oropharyngeal edema.  Neck: Normal range of motion. Neck supple.  Cardiovascular: Normal rate and normal heart sounds.   No murmur heard. Pulmonary/Chest: Effort normal and breath sounds normal. She has no wheezes. She has no rales.  Abdominal: Soft. Bowel sounds are normal. She exhibits no distension. There is no tenderness.  Musculoskeletal: Normal range of motion.  Lymphadenopathy:    She has no cervical adenopathy.  Neurological: She is alert and oriented to person, place, and time.  Skin: Skin is warm and dry. No pallor.  Psychiatric: She has a normal mood and affect.    ED Course  Procedures (including critical care time)  Labs Reviewed - No data to display No results found.  Dg Chest 2 View  05/01/2012  *RADIOLOGY REPORT*  Clinical Data: Headache and sore throat.  Chills and fever  CHEST - 2 VIEW  Comparison: 03/19/2012  Findings: The heart size and mediastinal contours are within normal limits.  Both lungs are clear.  The visualized skeletal structures are unremarkable.  IMPRESSION: Negative examination.   Original Report Authenticated By: Signa Kell, M.D.    No diagnosis found. 1. URI    MDM  Symptoms of URI - negative chest x-ray, exam supports viral illness.         Arnoldo Hooker, PA-C 05/01/12 1516

## 2012-07-12 ENCOUNTER — Encounter (HOSPITAL_COMMUNITY): Payer: Self-pay | Admitting: Obstetrics and Gynecology

## 2012-07-12 ENCOUNTER — Inpatient Hospital Stay (HOSPITAL_COMMUNITY)
Admission: AD | Admit: 2012-07-12 | Discharge: 2012-07-12 | Disposition: A | Discharge status: Home / Self Care | Payer: Self-pay | Source: Ambulatory Visit | Attending: Family Medicine | Admitting: Family Medicine

## 2012-07-12 DIAGNOSIS — Z202 Contact with and (suspected) exposure to infections with a predominantly sexual mode of transmission: Secondary | ICD-10-CM | POA: Insufficient documentation

## 2012-07-12 DIAGNOSIS — Z113 Encounter for screening for infections with a predominantly sexual mode of transmission: Secondary | ICD-10-CM

## 2012-07-12 DIAGNOSIS — N909 Noninflammatory disorder of vulva and perineum, unspecified: Secondary | ICD-10-CM | POA: Insufficient documentation

## 2012-07-12 DIAGNOSIS — L293 Anogenital pruritus, unspecified: Secondary | ICD-10-CM | POA: Insufficient documentation

## 2012-07-12 LAB — URINALYSIS, ROUTINE W REFLEX MICROSCOPIC
Glucose, UA: NEGATIVE mg/dL
Ketones, ur: NEGATIVE mg/dL
Leukocytes, UA: NEGATIVE
Protein, ur: NEGATIVE mg/dL

## 2012-07-12 LAB — WET PREP, GENITAL: Yeast Wet Prep HPF POC: NONE SEEN

## 2012-07-12 NOTE — MAU Note (Signed)
Tammy Knight is here for an STI check. She slept with a new partner on Sunday and noticed a "Bump" on her labia following intercourse. The bump itches, but does not hurt. Its irritating.

## 2012-07-12 NOTE — MAU Provider Note (Signed)
  History     CSN: [id number]  Arrival date and time: 07/12/12 1625   First Provider Initiated Contact with Patient 07/12/12 1817      Chief Complaint  Patient presents with  . SEXUALLY TRANSMITTED DISEASE   Patient is a 22 y.o. female presenting with STD exposure. The history is provided by the patient.  Exposure to STD This is a new problem. The current episode started in the past 7 days. The problem has been unchanged. Pertinent negatives include no abdominal pain, fever, headaches or rash. Associated symptoms comments: Vaginal itching . Nothing aggravates the symptoms.   Lakshmi Sundeen is a 22 y.o. female who presents to MAU for STI testing. She states that she had unprotected sex with a new partner 2 days ago and is having vaginal itching and some discharge. She is afraid that she may have genital warts or another STI. She is on no birth control.   OB History   Grav Para Term Preterm Abortions TAB SAB Ect Mult Living   0 0 0 0 0 0 0 0 0 0       Past Medical History  Diagnosis Date  . UTI (urinary tract infection)   . Asthma     Past Surgical History  Procedure Laterality Date  . Tonsillectomy      No family history on file.  History  Substance Use Topics  . Smoking status: Current Every Day Smoker  . Smokeless tobacco: Not on file  . Alcohol Use: No    Allergies:  Allergies  Allergen Reactions  . Penicillins Anaphylaxis    Prescriptions prior to admission  Medication Sig Dispense Refill  . albuterol (PROVENTIL HFA;VENTOLIN HFA) 108 (90 BASE) MCG/ACT inhaler Inhale 2 puffs into the lungs every 6 (six) hours as needed for wheezing.        Review of Systems  Constitutional: Negative for fever.  Gastrointestinal: Negative for abdominal pain.  Genitourinary: Negative for dysuria and frequency.       Vaginal itching  Musculoskeletal: Negative for back pain.  Skin: Negative for rash.  Neurological: Negative for dizziness and headaches.   Psychiatric/Behavioral: Negative for depression. The patient is not nervous/anxious.    Physical Exam   Blood pressure 131/64, pulse 84, resp. rate 18.  Physical Exam  Nursing note and vitals reviewed. Constitutional: She is oriented to person, place, and time. She appears well-developed and well-nourished. No distress.  HENT:  Head: Normocephalic and atraumatic.  Eyes: EOM are normal.  Neck: Neck supple.  Cardiovascular: Normal rate.   Respiratory: Effort normal.  GI: Soft. There is no tenderness.  Genitourinary:  External genitalia without lesions. Tiny cystic like area right labia major that is non tender with palpation. No CMT, no adnexal tenderness, uterus without palpable enlargement.  Musculoskeletal: Normal range of motion.  Neurological: She is alert and oriented to person, place, and time.  Skin: Skin is warm and dry.  Psychiatric: She has a normal mood and affect. Her behavior is normal. Judgment and thought content normal.   Procedures  Assessment: 22 y.o. female with possible STI exposure  Plan:  GC, Chlamydia cultures sent    Wet prep  MDM Patient scheduled to follow up with the [facility] tomorrow she will request screening for HIV, Syphilis and Hepatitis at that time.  Discussed with the patient and all questioned fully answered. She will return if any problems arise.   Addaline Peplinski, RN, FNP, [facility] 07/12/2012, 6:24 PM

## 2012-07-12 NOTE — MAU Provider Note (Signed)
Chart reviewed and agree with management and plan.  

## 2012-07-14 ENCOUNTER — Encounter (HOSPITAL_COMMUNITY): Payer: Self-pay | Admitting: *Deleted

## 2012-07-14 ENCOUNTER — Encounter: Payer: Self-pay | Admitting: *Deleted

## 2012-07-14 LAB — GC/CHLAMYDIA PROBE AMP: CT Probe RNA: POSITIVE — AB

## 2012-11-03 ENCOUNTER — Encounter (HOSPITAL_COMMUNITY): Payer: Self-pay | Admitting: Nurse Practitioner

## 2012-11-03 ENCOUNTER — Emergency Department (HOSPITAL_COMMUNITY)
Admission: EM | Admit: 2012-11-03 | Discharge: 2012-11-03 | Disposition: A | Discharge status: Home / Self Care | Payer: No Typology Code available for payment source | Attending: Emergency Medicine | Admitting: Emergency Medicine

## 2012-11-03 DIAGNOSIS — Z9089 Acquired absence of other organs: Secondary | ICD-10-CM | POA: Insufficient documentation

## 2012-11-03 DIAGNOSIS — Z880 Allergy status to penicillin: Secondary | ICD-10-CM | POA: Insufficient documentation

## 2012-11-03 DIAGNOSIS — J45909 Unspecified asthma, uncomplicated: Secondary | ICD-10-CM | POA: Insufficient documentation

## 2012-11-03 DIAGNOSIS — F17200 Nicotine dependence, unspecified, uncomplicated: Secondary | ICD-10-CM | POA: Insufficient documentation

## 2012-11-03 DIAGNOSIS — R599 Enlarged lymph nodes, unspecified: Secondary | ICD-10-CM | POA: Insufficient documentation

## 2012-11-03 DIAGNOSIS — Z87440 Personal history of urinary (tract) infections: Secondary | ICD-10-CM | POA: Insufficient documentation

## 2012-11-03 DIAGNOSIS — J029 Acute pharyngitis, unspecified: Secondary | ICD-10-CM | POA: Insufficient documentation

## 2012-11-03 NOTE — ED Provider Notes (Signed)
   History    CSN: [id number] Arrival date & time 11/03/12  0709  First MD Initiated Contact with Patient 11/03/12 0719     Chief Complaint  Patient presents with  . Sore Throat   (Consider location/radiation/quality/duration/timing/severity/associated sxs/prior Treatment) HPI.... sore throat for 3-4 days.   Minimal cervical adenopathy.    No fever, sweats, chills, stiff neck. Patient normally healthy   severity is mild Past Medical History  Diagnosis Date  . UTI (urinary tract infection)   . Asthma    Past Surgical History  Procedure Laterality Date  . Tonsillectomy     Family History  Problem Relation Age of Onset  . Heart attack Father    History  Substance Use Topics  . Smoking status: Current Every Day Smoker  . Smokeless tobacco: Not on file  . Alcohol Use: No   OB History   Grav Para Term Preterm Abortions TAB SAB Ect Mult Living   0 0 0 0 0 0 0 0 0 0      Review of Systems  All other systems reviewed and are negative.    Allergies  Penicillins  Home Medications   Current Outpatient Rx  Name  Route  Sig  Dispense  Refill  . ibuprofen (ADVIL,MOTRIN) 200 MG tablet   Oral   Take 200 mg by mouth every 6 (six) hours as needed for pain (pain).          BP 118/68  Pulse 79  Temp(Src) 98 F (36.7 C) (Oral)  Resp 20  Ht 5\' 5"  (1.651 m)  Wt 176 lb (79.833 kg)  BMI 29.29 kg/m2  SpO2 99%  LMP 10/13/2012 Physical Exam  Nursing note and vitals reviewed. Constitutional: She is oriented to person, place, and time. She appears well-developed and well-nourished.  HENT:  Head: Normocephalic and atraumatic.  Oropharynx:  Slightly erythematous with 2 mm white patch on right tonsil  Eyes: Conjunctivae and EOM are normal. Pupils are equal, round, and reactive to light.  Neck: Normal range of motion. Neck supple.  Cardiovascular: Normal rate, regular rhythm and normal heart sounds.   Pulmonary/Chest: Effort normal and breath sounds normal.  Abdominal: Soft.  Bowel sounds are normal.  Musculoskeletal: Normal range of motion.  Neurological: She is alert and oriented to person, place, and time.  Skin: Skin is warm and dry.  Psychiatric: She has a normal mood and affect.    ED Course  Procedures (including critical care time) Labs Reviewed  RAPID STREP SCREEN  CULTURE, GROUP A STREP   No results found. 1. Pharyngitis     MDM  Patient is nontoxic. Strep test negative. No antibiotics. Supportive care only  Donnetta Hutching, MD 11/03/12 [phone]

## 2012-11-03 NOTE — ED Notes (Addendum)
Per pt:  Sore throat began on Monday, July 13th associated with headache, earache, and malaise.  Pt came in today due to sore throat which is affecting pt's ability to swallow fluids and solids.  Pt has been taking ibuprofen- last taken yesterday at 5pm.  Pt is not on a antihistamine due to no allergens.  Pt denies being around sick contacts. Pt denies hx of mononucleosis.  No fever at this time.

## 2012-11-03 NOTE — Progress Notes (Signed)
[facility] has seen patient and provided her with a list of primary care resources. °

## 2012-11-05 LAB — CULTURE, GROUP A STREP

## 2013-08-21 ENCOUNTER — Encounter: Payer: Self-pay | Admitting: Obstetrics and Gynecology

## 2013-08-21 ENCOUNTER — Ambulatory Visit (INDEPENDENT_AMBULATORY_CARE_PROVIDER_SITE_OTHER): Payer: No Typology Code available for payment source | Admitting: Obstetrics and Gynecology

## 2013-08-21 VITALS — BP 120/62 | HR 60 | Resp 16 | Ht 65.0 in | Wt 178.0 lb

## 2013-08-21 DIAGNOSIS — Z0000 Encounter for general adult medical examination without abnormal findings: Secondary | ICD-10-CM

## 2013-08-21 DIAGNOSIS — Z01419 Encounter for gynecological examination (general) (routine) without abnormal findings: Secondary | ICD-10-CM

## 2013-08-21 DIAGNOSIS — Z309 Encounter for contraceptive management, unspecified: Secondary | ICD-10-CM

## 2013-08-21 LAB — POCT URINALYSIS DIPSTICK
Bilirubin, UA: NEGATIVE
Glucose, UA: NEGATIVE
Ketones, UA: NEGATIVE
Leukocytes, UA: NEGATIVE
Nitrite, UA: NEGATIVE
PROTEIN UA: NEGATIVE
RBC UA: NEGATIVE
UROBILINOGEN UA: NEGATIVE
pH, UA: 7

## 2013-08-21 NOTE — Progress Notes (Signed)
GYNECOLOGY VISIT  PCP: N/A  Referring provider:   HPI: 23 y.o.   Single  Caucasian  female   G0P0000 with Patient's last menstrual period was 08/15/2013.   here for   NEW PATIENT AEX Needs birth control. Went to health Dept in the past.  Took Depo Provera.  Had weight gain and mom thought she became depressed.  Cannot remember a pill every day. Interested in [place].  Friend got pregnant using the NuvaRing.   Regular menses.  No painful or heavy flow.   Declines STD testing.  Worried about the cost of this.   Hgb: 13.1  Urine:  Clear  GYNECOLOGIC HISTORY: Patient's last menstrual period was 08/15/2013. Sexually active:  Yes Partner preference: Female Contraception: Condoms  Menopausal hormone therapy: No DES exposure: No Blood transfusions: No  Sexually transmitted diseases: Chlamydia - 2014 GYN procedures and prior surgeries:  No Last mammogram:   No              Last pap and high risk HPV testing:  2013 - WNL   History of abnormal pap smear:  No   OB History   Grav Para Term Preterm Abortions TAB SAB Ect Mult Living   0 0 0 0 0 0 0 0 0 0        LIFESTYLE: Exercise:  Yes, Cardio             Tobacco: Former Alcohol: No Drug use: No   OTHER HEALTH MAINTENANCE: Tetanus/TDap: 07/2013 Gardisil: Yes - completed all 3. Influenza:  No Zostavax: No  Bone density: No Colonoscopy: No  Cholesterol check: ?  Family History  Problem Relation Age of Onset  . Heart attack Father     2014  . COPD Mother     There are no active problems to display for this patient.  Past Medical History  Diagnosis Date  . UTI (urinary tract infection)   . Asthma   . Chlamydia     Past Surgical History  Procedure Laterality Date  . Tonsillectomy    . Abdominoplasty  2014    ALLERGIES: Penicillins and Bee venom  Current Outpatient Prescriptions  Medication Sig Dispense Refill  . Multiple Vitamin (MULTIVITAMIN) tablet Take 1 tablet by mouth daily.      Marland Kitchen ibuprofen  (ADVIL,MOTRIN) 200 MG tablet Take 200 mg by mouth every 6 (six) hours as needed for pain (pain).       No current facility-administered medications for this visit.     ROS:  Pertinent items are noted in HPI.  SOCIAL HISTORY:  Waitress.   PHYSICAL EXAMINATION:    BP 120/62  Pulse 60  Resp 16  Ht 5\' 5"  (1.651 m)  Wt 178 lb (80.74 kg)  BMI 29.62 kg/m2  LMP 08/15/2013   Wt Readings from Last 3 Encounters:  08/21/13 178 lb (80.74 kg)  11/03/12 176 lb (79.833 kg)  11/04/11 167 lb (75.751 kg)     Ht Readings from Last 3 Encounters:  08/21/13 5\' 5"  (1.651 m)  11/03/12 5\' 5"  (1.651 m)  11/04/11 5\' 5"  (1.651 m)    General appearance: alert, cooperative and appears stated age Head: Normocephalic, without obvious abnormality, atraumatic Neck: no adenopathy, supple, symmetrical, trachea midline and thyroid not enlarged, symmetric, no tenderness/mass/nodules Lungs: clear to auscultation bilaterally Breasts: Inspection negative, No nipple retraction or dimpling, No nipple discharge or bleeding, No axillary or supraclavicular adenopathy, Normal to palpation without dominant masses Heart: regular rate and rhythm Abdomen: abdominoplasty incisions, soft, non-tender; no  masses,  no organomegaly Extremities: extremities normal, atraumatic, no cyanosis or edema Skin: Skin color, texture, turgor normal. No rashes or lesions Lymph nodes: Cervical, supraclavicular, and axillary nodes normal. No abnormal inguinal nodes palpated Neurologic: Grossly normal  Pelvic: External genitalia:  no lesions              Urethra:  normal appearing urethra with no masses, tenderness or lesions              Bartholins and Skenes: normal                 Vagina: normal appearing vagina with normal color and discharge, no lesions              Cervix: normal appearance              Pap and reflex high risk HPV testing done: yes.            Bimanual Exam:  Uterus:  uterus is normal size, shape, consistency and  nontender                                      Adnexa: normal adnexa in size, nontender and no masses                                      Rectovaginal: Confirms                                      Anus:  normal sphincter tone, no lesions  ASSESSMENT  Normal gynecologic exam. Status post abdominoplasty.   PLAN  Will precert for Nexplanon.  Pap smear and reflex. high risk HPV testing Counseled on self breast exam, Calcium and vitamin D intake. Declines STD testing.  We will check her insurance to see if this is covered.  She can do the testing at the visit for the [place].  Return annually or prn   An After Visit Summary was printed and given to the patient.

## 2013-08-21 NOTE — Progress Notes (Deleted)
GYNECOLOGY VISIT  PCP: N/A  Referring provider:   HPI: 23 y.o.   Single  Caucasian  female   G0P0000 with Patient's last menstrual period was 08/15/2013.   here for   NEW PATIENT AEX  Hgb: 13.1  Urine:  Clear  GYNECOLOGIC HISTORY: Patient's last menstrual period was 08/15/2013. Sexually active:  Yes Partner preference: Female Contraception: Condoms  Menopausal hormone therapy: No DES exposure: No Blood transfusions: No  Sexually transmitted diseases: Chlamydia  GYN procedures and prior surgeries:   Last mammogram:                 Last pap and high risk HPV testing:    History of abnormal pap smear:     OB History   Grav Para Term Preterm Abortions TAB SAB Ect Mult Living   0 0 0 0 0 0 0 0 0 0        LIFESTYLE: Exercise:               Tobacco:  Alcohol: Drug use:    OTHER HEALTH MAINTENANCE: Tetanus/TDap: Gardisil: Influenza:   Zostavax:  Bone density: Colonoscopy:  Cholesterol check:   Family History  Problem Relation Age of Onset  . Heart attack Father     2014  . COPD Mother     There are no active problems to display for this patient.  Past Medical History  Diagnosis Date  . UTI (urinary tract infection)   . Asthma     Past Surgical History  Procedure Laterality Date  . Tonsillectomy      ALLERGIES: Penicillins and Bee venom  Current Outpatient Prescriptions  Medication Sig Dispense Refill  . Multiple Vitamin (MULTIVITAMIN) tablet Take 1 tablet by mouth daily.      Marland Kitchen ibuprofen (ADVIL,MOTRIN) 200 MG tablet Take 200 mg by mouth every 6 (six) hours as needed for pain (pain).       No current facility-administered medications for this visit.     ROS:  Pertinent items are noted in HPI.  SOCIAL HISTORY:    PHYSICAL EXAMINATION:    BP 120/62  Pulse 60  Resp 16  Ht 5\' 5"  (1.651 m)  Wt 178 lb (80.74 kg)  BMI 29.62 kg/m2  LMP 08/15/2013   Wt Readings from Last 3 Encounters:  08/21/13 178 lb (80.74 kg)  11/03/12 176 lb (79.833 kg)   11/04/11 167 lb (75.751 kg)     Ht Readings from Last 3 Encounters:  08/21/13 5\' 5"  (1.651 m)  11/03/12 5\' 5"  (1.651 m)  11/04/11 5\' 5"  (1.651 m)    General appearance: alert, cooperative and appears stated age Head: Normocephalic, without obvious abnormality, atraumatic Neck: no adenopathy, supple, symmetrical, trachea midline and thyroid not enlarged, symmetric, no tenderness/mass/nodules Lungs: clear to auscultation bilaterally Breasts: Inspection negative, No nipple retraction or dimpling, No nipple discharge or bleeding, No axillary or supraclavicular adenopathy, Normal to palpation without dominant masses Heart: regular rate and rhythm Abdomen: soft, non-tender; no masses,  no organomegaly Extremities: extremities normal, atraumatic, no cyanosis or edema Skin: Skin color, texture, turgor normal. No rashes or lesions Lymph nodes: Cervical, supraclavicular, and axillary nodes normal. No abnormal inguinal nodes palpated Neurologic: Grossly normal  Pelvic: External genitalia:  no lesions              Urethra:  normal appearing urethra with no masses, tenderness or lesions              Bartholins and Skenes: normal  Vagina: normal appearing vagina with normal color and discharge, no lesions              Cervix: normal appearance              Pap and high risk HPV testing done: {yes no:314532}.            Bimanual Exam:  Uterus:  uterus is normal size, shape, consistency and nontender                                      Adnexa: normal adnexa in size, nontender and no masses                                      Rectovaginal: Confirms                                      Anus:  normal sphincter tone, no lesions  ASSESSMENT  Normal gynecologic exam.  PLAN  Mammogram recommended yearly.  Pap smear and high risk HPV testing Counseled on self breast exam, Calcium and vitamin D intake, exercise. Return annually or prn   An After Visit Summary was printed and  given to the patient.

## 2013-08-21 NOTE — Patient Instructions (Signed)

## 2013-08-22 LAB — HEMOGLOBIN, FINGERSTICK: Hemoglobin, fingerstick: 13.1 g/dL (ref 12.0–16.0)

## 2013-08-23 LAB — IPS PAP TEST WITH REFLEX TO HPV

## 2013-08-24 ENCOUNTER — Telehealth: Payer: Self-pay | Admitting: Obstetrics and Gynecology

## 2013-08-24 NOTE — Telephone Encounter (Signed)
Message copied by Michele Mcalpine on Thu Aug 24, 2013  4:42 PM ------      Message from: [place], [place]: Thu Aug 24, 2013  8:01 AM       Olivia Mackie,            Please contact patient regarding her pap showing LGSIL.      She is 22, so the standard of care is to repeat the pap in 12 months.      No colposcopy is indicated.            We are in the process of precerting her Nexplanon.             Please place in recall - 08.            CC - Marisa Sprinkles ------

## 2013-08-24 NOTE — Telephone Encounter (Signed)
Patient is calling someone back doesn't know who

## 2013-08-24 NOTE — Telephone Encounter (Signed)
Pt's mom is returning a call she does not have voice mail so a message could not be left. There is no authorization on file to speak with the mom.

## 2013-08-24 NOTE — Telephone Encounter (Signed)
Spoke with patient, confirmed name and date of birth.   Advised of message from Dr. Quincy Simmonds. 08 Recall entered.  Discussed results. Very important to follow up in one year for repeat pap smear. She verbalized understanding. Advised if further questions can discuss with Dr. Quincy Simmonds at [facility] appointment.  Advised patient of $81.82 pre certification cost for nexplanon. Instructed pt to call back on the first day of her next cycle to be scheduled for insertion within first 5 days of her cycle. If period starts on weekend, just call Monday morning first thing. Pt agreeable.   Routing to provider for final review. Patient agreeable to disposition. Will close encounter

## 2013-08-25 ENCOUNTER — Telehealth: Payer: Self-pay | Admitting: Obstetrics and Gynecology

## 2013-08-25 NOTE — Telephone Encounter (Signed)
Patient has some questions about recent pap results.

## 2013-08-25 NOTE — Telephone Encounter (Signed)
Tried to call patient to make her aware that [facility] is her preferred lab. Dr. Quincy Simmonds asked me to find out if particular group of tests are covered under the patient's plan. I contacted the carrier and was told that for maximum benefits, the patient should use [facility]. I was unable to reach the patient at either number provider because no voice mail has been set up. I can share this information with her if she calls back,seeing our telephone number on her call log.

## 2013-08-25 NOTE — Telephone Encounter (Signed)
Spoke with patient. She has concerns regarding her pap smear results that we discussed yesterday. Patient states "I have been worried, I am afraid this is cancer".  Reminded patient that when we discussed this yesterday, we talked about how this abnormal pap smear did not mean that she has cervical cancer. Discussed again that any cell changes on the cervix occur over many years and that one year before another pap smear is done is to give her body time to allow the cells to go back to normal on their own, and that after one year can redo pap smear to monitor for any changes.   Patient requests that another pap smear be done sooner than one year.  I advised patient that the recommended guidelines are in one year but if patient continues to have concerns that she should come in and discuss with Dr. Quincy Simmonds. Patient declines at this time, but will plan coming in for nexplanon placement. Patient states she feels much better our call and will follow up prn.    Routing to provider for final review. Patient agreeable to disposition. Will close encounter

## 2013-08-25 NOTE — Telephone Encounter (Signed)
Message left to return call to [place] at [phone] on mobile that confirmed named, Tammy Knight.    Patient will also need to be transferred to [name] to discuss lab benefits per telephone message from [place].

## 2013-09-18 ENCOUNTER — Telehealth: Payer: Self-pay | Admitting: Obstetrics and Gynecology

## 2013-09-18 NOTE — Telephone Encounter (Signed)
Patient is calling to let us know that her cycle started Saturday morning. She wanting to schedule IUD insertion

## 2013-09-18 NOTE — Telephone Encounter (Signed)
Spoke with patient. Patient started menses on Saturday and would like to schedule nexplanon insertion. Offered 8 or 10 o clock appointment time on Wednesday with Dr.Silva. Patient declines stating that she will be in class at that time. Patient states that she has to work today at 3:45pm, has class tomorrow and Wednesday from 9-2:50pm and can come in after 3pm. Advised would have to check scheduling and with provider for best appointment availability and give patient a call back. Patient agreeable.

## 2013-09-18 NOTE — Telephone Encounter (Signed)
Spoke with patient. Advised that I spoke with Dr.Silva and we can see patient on 6/3 at 7:00am for nexplanon insertion. Patient agreeable to date and time. Appointment scheduled. Patient aware of benefits and OOP cost for procedure.  Routing to provider for final review. Patient agreeable to disposition. Will close encounter

## 2013-09-20 ENCOUNTER — Encounter: Payer: Self-pay | Admitting: Obstetrics and Gynecology

## 2013-09-20 ENCOUNTER — Ambulatory Visit (INDEPENDENT_AMBULATORY_CARE_PROVIDER_SITE_OTHER): Payer: No Typology Code available for payment source | Admitting: Obstetrics and Gynecology

## 2013-09-20 DIAGNOSIS — R87612 Low grade squamous intraepithelial lesion on cytologic smear of cervix (LGSIL): Secondary | ICD-10-CM

## 2013-09-20 DIAGNOSIS — Z309 Encounter for contraceptive management, unspecified: Secondary | ICD-10-CM

## 2013-09-20 DIAGNOSIS — Z30017 Encounter for initial prescription of implantable subdermal contraceptive: Secondary | ICD-10-CM

## 2013-09-20 NOTE — Patient Instructions (Signed)
Etonogestrel implant What is this medicine? ETONOGESTREL (et oh noe JES trel) is a contraceptive (birth control) device. It is used to prevent pregnancy. It can be used for up to 3 years. This medicine may be used for other purposes; ask your health care provider or pharmacist if you have questions. COMMON BRAND NAME(S): Implanon, Nexplanon  What should I tell my health care provider before I take this medicine? They need to know if you have any of these conditions: -abnormal vaginal bleeding -blood vessel disease or blood clots -cancer of the breast, cervix, or liver -depression -diabetes -gallbladder disease -headaches -heart disease or recent heart attack -high blood pressure -high cholesterol -kidney disease -liver disease -renal disease -seizures -tobacco smoker -an unusual or allergic reaction to etonogestrel, other hormones, anesthetics or antiseptics, medicines, foods, dyes, or preservatives -pregnant or trying to get pregnant -breast-feeding How should I use this medicine? This device is inserted just under the skin on the inner side of your upper arm by a health care professional. Talk to your pediatrician regarding the use of this medicine in children. Special care may be needed. Overdosage: If you think you've taken too much of this medicine contact a poison control center or emergency room at once. Overdosage: If you think you have taken too much of this medicine contact a poison control center or emergency room at once. NOTE: This medicine is only for you. Do not share this medicine with others. What if I miss a dose? This does not apply. What may interact with this medicine? Do not take this medicine with any of the following medications: -amprenavir -bosentan -fosamprenavir This medicine may also interact with the following medications: -barbiturate medicines for inducing sleep or treating seizures -certain medicines for fungal infections like ketoconazole and  itraconazole -griseofulvin -medicines to treat seizures like carbamazepine, felbamate, oxcarbazepine, phenytoin, topiramate -modafinil -phenylbutazone -rifampin -some medicines to treat HIV infection like atazanavir, indinavir, lopinavir, nelfinavir, tipranavir, ritonavir -St. John's wort This list may not describe all possible interactions. Give your health care provider a list of all the medicines, herbs, non-prescription drugs, or dietary supplements you use. Also tell them if you smoke, drink alcohol, or use illegal drugs. Some items may interact with your medicine. What should I watch for while using this medicine? This product does not protect you against HIV infection (AIDS) or other sexually transmitted diseases. You should be able to feel the implant by pressing your fingertips over the skin where it was inserted. Tell your doctor if you cannot feel the implant. What side effects may I notice from receiving this medicine? Side effects that you should report to your doctor or health care professional as soon as possible: -allergic reactions like skin rash, itching or hives, swelling of the face, lips, or tongue -breast lumps -changes in vision -confusion, trouble speaking or understanding -dark urine -depressed mood -general ill feeling or flu-like symptoms -light-colored stools -loss of appetite, nausea -right upper belly pain -severe headaches -severe pain, swelling, or tenderness in the abdomen -shortness of breath, chest pain, swelling in a leg -signs of pregnancy -sudden numbness or weakness of the face, arm or leg -trouble walking, dizziness, loss of balance or coordination -unusual vaginal bleeding, discharge -unusually weak or tired -yellowing of the eyes or skin Side effects that usually do not require medical attention (Report these to your doctor or health care professional if they continue or are bothersome.): -acne -breast pain -changes in  weight -cough -fever or chills -headache -irregular menstrual bleeding -itching, burning,   and vaginal discharge -pain or difficulty passing urine -sore throat This list may not describe all possible side effects. Call your doctor for medical advice about side effects. You may report side effects to FDA at 1-800-FDA-1088. Where should I keep my medicine? This drug is given in a hospital or clinic and will not be stored at home. NOTE: This sheet is a summary. It may not cover all possible information. If you have questions about this medicine, talk to your doctor, pharmacist, or health care provider.  2014, Elsevier/Gold Standard. (2011-10-12 15:37:45)  

## 2013-09-20 NOTE — Progress Notes (Signed)
GYNECOLOGY  VISIT   HPI: 23 y.o.   Single  Caucasian  female   G0P0000    here for Nexplanon insertion LMP was 09/16/13.    Asking about pap which showed LGSIL.   GYNECOLOGIC HISTORY: Patient's last menstrual period was 08/15/2013. Contraception:   Condoms.         OB History   Grav Para Term Preterm Abortions TAB SAB Ect Mult Living   0 0 0 0 0 0 0 0 0 0          There are no active problems to display for this patient.   Past Medical History  Diagnosis Date  . UTI (urinary tract infection)   . Asthma   . Chlamydia     Past Surgical History  Procedure Laterality Date  . Tonsillectomy    . Abdominoplasty  2014    Current Outpatient Prescriptions  Medication Sig Dispense Refill  . ibuprofen (ADVIL,MOTRIN) 200 MG tablet Take 200 mg by mouth every 6 (six) hours as needed for pain (pain).      . Multiple Vitamin (MULTIVITAMIN) tablet Take 1 tablet by mouth daily.       No current facility-administered medications for this visit.     ALLERGIES: Penicillins and Bee venom  Family History  Problem Relation Age of Onset  . Heart attack Father     2014  . COPD Mother     History   Social History  . Marital Status: Single    Spouse Name: N/A    Number of Children: N/A  . Years of Education: N/A   Occupational History  . Not on file.   Social History Main Topics  . Smoking status: Former Research scientist (life sciences)  . Smokeless tobacco: Never Used  . Alcohol Use: No  . Drug Use: No  . Sexual Activity: Yes    Partners: Male    Birth Control/ Protection: Condom   Other Topics Concern  . Not on file   Social History Narrative  . No narrative on file    ROS:  Pertinent items are noted in HPI.  PHYSICAL EXAMINATION:    LMP 08/15/2013     General appearance: alert, cooperative and appears stated age   30 Insertion - Lot number [id number]. Expiration 02/2016  Consent for procedure. Sterile prep of left arm with betadine and then alcohol swab. Local 1%  lidocaine 2.5 cc. Nexplanon placed without difficulty. Betadine removed. Bandaid and sterile gauze wrap placed.  No complications.  Minimal EBL.   ASSESSMENT  Nexplanon insertion.  LGSIL.  PLAN  Instructions and precautions give.  Discussion regarding HPV and LGSIL, life history of HPV and plan for repeat pap in one year.  Follow up in 5 weeks for recheck of Nexplanon.     An After Visit Summary was printed and given to the patient.  __10____ additional minutes face to face time of which over 50% was spent in counseling.

## 2013-09-21 ENCOUNTER — Telehealth: Payer: Self-pay

## 2013-09-21 ENCOUNTER — Ambulatory Visit: Payer: No Typology Code available for payment source | Admitting: Obstetrics and Gynecology

## 2013-09-21 ENCOUNTER — Encounter: Payer: Self-pay | Admitting: Obstetrics and Gynecology

## 2013-09-21 ENCOUNTER — Telehealth: Payer: Self-pay | Admitting: Obstetrics and Gynecology

## 2013-09-21 NOTE — Telephone Encounter (Signed)
Spoke with patient. Patient is crying. Nexplanon was inserted yesterday with Dr.Silva. Patient states that her arm was not hurting at all last night or this morning but when she picked up her bookbag for school the pain started. Patient states she is in 6/10 pain with "A lot of pressure in my arm. It hurts to move it at all." Patient denies redness at insertion site, swelling, or hot to the touch. Patient states her arm feels numb and she feels like she is going to pass out. Advised patient that she will need to be seen in office today for evaluation. Patient states that she is at school right now and cant miss class. Advised patient with the amount of pain she is having with side effects we would need to see her in office to check nexplanon insertion site. Advised would speak with Dr.Silva about symptoms and scheduling and give patient a call back. Patient agreeable.

## 2013-09-21 NOTE — Telephone Encounter (Signed)
May have gone to urgent care.

## 2013-09-21 NOTE — Telephone Encounter (Signed)
LMOVM asking pt. To call stating concerned about her because she missed 4:00 appt. Today.

## 2013-09-21 NOTE — Telephone Encounter (Signed)
Left message to call [place] at [phone]. Detailed message left at number provided. Okay per ROI. Advised patient that spoke with Dr.Silva and she recommends that she come now to be seen in office. Advised we need her to come now to be seen before 11am. Advised to call office back at [phone].

## 2013-09-21 NOTE — Telephone Encounter (Signed)
Patient is complaining of nexplanon hurting in her arm. Tammy Knight it feels like her arm is numb and feels like a lot of pressure. Said she picked up her book bag for school and it started hurting. Has some questions about it. (she is upset and crying)

## 2013-09-21 NOTE — Telephone Encounter (Signed)
Patient [facility] appt for pain in left arm with nexplanon. Called but no answer left message for her to call back.

## 2013-09-21 NOTE — Telephone Encounter (Signed)
Tammy Knight has contacted patient who has agreed to come in at 4 pm today. If unable to come for any reason, will go to urgent care.   [place]

## 2013-09-22 ENCOUNTER — Telehealth: Payer: Self-pay | Admitting: *Deleted

## 2013-09-22 ENCOUNTER — Ambulatory Visit (INDEPENDENT_AMBULATORY_CARE_PROVIDER_SITE_OTHER): Payer: No Typology Code available for payment source | Admitting: Obstetrics & Gynecology

## 2013-09-22 ENCOUNTER — Other Ambulatory Visit: Payer: Self-pay | Admitting: Obstetrics and Gynecology

## 2013-09-22 ENCOUNTER — Encounter: Payer: Self-pay | Admitting: Obstetrics & Gynecology

## 2013-09-22 VITALS — BP 110/60 | HR 64 | Resp 16 | Ht 65.0 in | Wt 180.0 lb

## 2013-09-22 DIAGNOSIS — Z3046 Encounter for surveillance of implantable subdermal contraceptive: Secondary | ICD-10-CM

## 2013-09-22 NOTE — Telephone Encounter (Signed)
Spoke with patient. Advised patient can come in today to be seen for removal of nexplanon at 3:00pm with Dr.Silva. Patient states that she needs to check with her manager at work to see if she can leave early to come in. Advised patient that she can speak with patient and call back as soon as possible so that we can get her in to be seen. Advised patient she will be responsible for paying no show fee from yesterday's appointment and for removal of nexplanon up front at appointment today. Patient states "What? I did not have an appointment yesterday." Advised patient that she was on the schedule for yesterday at 4pm with Dr.Silva. Patient states "I am not going to pay the no show fee when I did not have an appointment. I told the nurse not to worry about it when I found out I was going to have to pay a copay. I will pay to have it removed but that is all and then I am never coming back to your practice." Advised patient to speak with manager about appointment and I will have to speak with someone in the office who is above me regarding fees due at today's appointment. Advised would let patient know about fees when she calls back to verify appointment. Patient agreeable.

## 2013-09-22 NOTE — Telephone Encounter (Signed)
There is another open note closing this one

## 2013-09-22 NOTE — Telephone Encounter (Signed)
Patients mom called during lunch asking if daughter could have nexplanon removed today.

## 2013-09-22 NOTE — Telephone Encounter (Signed)
Pt says she spoke with nurse this am about her nexplanon. She is calling back because she would like to have it removed.

## 2013-09-22 NOTE — Telephone Encounter (Signed)
Left message to call Ely at [phone].  Per Dr.Silva patient can be seen today for removal at 3:00pm. Patient will need to pay for removal and no show fee from yesterday's appointment.

## 2013-09-22 NOTE — Telephone Encounter (Signed)
Called pt and told her the amount she would owe for the removal ($60.04) and if OV charged,she would also be responsible for a $40 copay. She asked if she could come in today to have it done. I am routing this to triage for direction.

## 2013-09-22 NOTE — Telephone Encounter (Signed)
Routing to [employer] as Gabriel Cirri is out of the office this week.  [name], could you precert nexplanon removal for this patient? Thank you!

## 2013-09-22 NOTE — Telephone Encounter (Signed)
Please precert the removal and make an appointment for next week.   I will place an order for removal.

## 2013-09-22 NOTE — Telephone Encounter (Signed)
2:50 pmPatient has called multiple times today regarding concern over Nexplanon since inserted two days ago. Patient states she had color change in her arm yesterday, felt like she had a blood pressure cuff on her arm.  Resolved today. "Just doesn't like it" and "dont care for it." Worried about weight gain. 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 Worried about side effects. Advised we can see her to let Dr Quincy Simmonds evaluate and determine if she really wants removal. Confirmed there is a charge that is due prior to appointment. Frustrated that she is charged cancel fee for appointment yesterday when she never "agreed" to appointment because of having a copay. "Dont have money coming out my butt." Explained that we were trying to work with her schedule and Dr Quincy Simmonds had extended her own schedule to accommodate patients school schedule. Patient states she will just wait till next week because she will have to arrange with mother to work out payment. Again, offered to see her. She states she is ok and can wait till Monday. 16 minutes on phone with patient and Myles Lipps present for call.         Patient called back at 320 and states that she has now decided she wants to come in and is 20 minutes away. Advised she could start driving but asked for cell number, i need to see if Dr Quincy Simmonds is even still in office.  Called patient back X3, call straight to VM, Left 2 messages that Dr Quincy Simmonds not available. 4Th call to patient at 1540, she states she did not receive my calls (VM has name confirmation)advised it is too late to be seen and scheduled appointment with Dr Quincy Simmonds for Monday at 345.  1550. Patient arrives in office requesting to speak to someone in billing.  Jessica Harris/billing and I spoke to patient in billing office. She is expressing frustration regarding no show fee from yesterday for an appointment she did not agree to. States she knows this medication is making her a big B.   Patient verbalizes frustration and even shows text message that she responded to saying "never mind"  Estill Bamberg texted patient yesterday since could not reach her). Advised will waive this fee and apply remaining credit to removal procedure scheduled for Monday. Patient appreciative and then asked if there was anyone who could remove Nexplanon today. Advised since it was so late that there was no open appointments today. She asked if she could wait and see if anyone "no shows" and be worked in. Advised this would be acceptable.  410p, advised patient that Dr Sabra Heck agrees to see her after she sees her remaining 2 patients.  Patient agreeable.  See OV note from Dr Sabra Heck.   Routing to provider for final review. Patient agreeable to disposition. Will close encounter

## 2013-09-22 NOTE — Telephone Encounter (Signed)
Spoke with patient at time of incoming call. Patient states that she would like to come in to have her nexplanon removed. Patient states that nexplanon was making her "Emotional and tired. My arm was changing color and was really hurting yesterday when I was moving it or lifting. It is just stinging today." Patient denies color change in arm today, swelling, hot to the touch, and being red. "When I move I feel like it is going to fall out." Patient was scheduled for appointment yesterday for evaluation but states "I got really emotional about it all and did not want to come in for you guys to tell me these are normal side effects." Advised patient it is important for [place] to evaluate insertion and side effects as it may call for further action. Patient would like to now if her side effects are "normal." Advised patient different birth controls can effect people in different ways but we would not want her to continue with this method if it is impacting her health. Advised soreness and bruising is common with insertion for a couple of days to a week. Advised patient change in color in entire arm and pain is something that will need to be evaluated and addressed. Patient would like to know how much removal will cost before she schedules appointment. Advised would place an order for the billing department for precert and she will be contacted with OOP cost requirements for removal. Patient agreeable.

## 2013-09-25 ENCOUNTER — Telehealth: Payer: Self-pay | Admitting: Emergency Medicine

## 2013-09-25 ENCOUNTER — Ambulatory Visit: Payer: No Typology Code available for payment source | Admitting: Obstetrics and Gynecology

## 2013-09-25 MED ORDER — NORETHIN-ETH ESTRAD-FE BIPHAS 1 MG-10 MCG / 10 MCG PO TABS: 1.0000 | ORAL_TABLET | Freq: Every day | ORAL | Status: DC

## 2013-09-25 NOTE — Telephone Encounter (Signed)
Message copied by Michele Mcalpine on Mon Sep 25, 2013  9:35 AM ------      Message from: Megan Salon      Created: Mon Sep 25, 2013  6:06 AM      Regarding: check on pt please       PT that I took out the implanon on Friday.  Can you call and check on her and see how mood has been.  I sent rx to pharmacy for [facility] but i do NOT want her to start this until she has gone through the rest of the month without any hormone.  Use condoms if SA.  I didn't give them a loloestrin savings card but I think we have one so can you send one to them.            I'd like to recheck with her three months about the new pills to make sure she is doing ok with them.  Please schedule appt when you talk with her if possible.  Thanks.            MSM ------

## 2013-09-25 NOTE — Telephone Encounter (Signed)
Message left to return call to Tammy Knight at [phone].    

## 2013-09-25 NOTE — Progress Notes (Signed)
81 yrs Caucasian Single female [id number] presents for Implanon/Nexplanon removal.  Pt just had this placed two days ago.  Reports she is feeling very emotional and "bitchy".  She knows she was rude to several staff members.  Her mother accompanies the patient today.  While they were both discussing her issues and options for birth control, her mother answered a call from her sister (the mother reports this to me) and I could hear her voice very loudly through the phone asking if this was getting taken care of.  Mother asks for me to excuse the loudness and tone of daughter and apologizes that her daughters are acting so "out of character".  She does feel her daughter is much more emotional these past two days.  Wants to discuss why this occurred and why the implanon was placed, in the first place.  Advised pt and mother that pt had used DepoProvera prior without any issues that were mood related.  She discontinued DepoProvera due to bleeding and weight gain.  Discussed typical minimal weight gain for Implanon.  Also, discussed DUB as most common reason for dicontinuation of this method.  However, I cannot say that her mood changes are NOT related to this method, so I will be happy to remove the device.  Discussed other birth control options.  I feel she should let this completely get out of her system and then start OCPs after her next cycle.  Mother in agreement.  Instructions for use given.  D/W pt risks of DVT/PE, stroke, hypertension, DUB.  Aware needs to take one pill daily around the same time each day.    HPI neg.  Exam:  WNWD, WF, NAD  Procedure: Patient placed supine on exam table with her left arm flexed at the elbow and externally rotated.  There is expected bruising from the prior placement.  Decision made to remove Nexplanon above area of bruising.  The device was easily palpated. Incision site for removal was marked. The removal site was cleansed with betadine solution and 1 ml of 1% Lidocaine  was injected at the incision site. A small 1 mm incision was made at the distal end of the implant.The Nexaplon implant was grasped with curved forceps and removed gently intact.The implant was shown to the patient and discarded. The incision was closed with a steri strip.  No active bleeding was noted.  A pressure bandage was place over the site.  Pt tolerated procedure well.  Assessment:  Nexaplon Removal Start OCPs after next cycle.  Plan :Instructions and warning signs and symptoms given.  Remove bandage in 3 days. Questions addressed Loloestrin rx to pharmacy   Return Visit 3 months

## 2013-09-25 NOTE — Telephone Encounter (Signed)
Patient seen 6/5 for nexplanon removal by Dr.Miller.  Routing to provider for final review. Patient agreeable to disposition. Will close encounter

## 2013-09-27 NOTE — Telephone Encounter (Signed)
Pt's mom called to cancel appointment for 10/02/13. This appointment should of been canceled since  implanon was removed on 09/22/13. Tammy Knight stated she does not want to reschedule her appointment. She states she does not want to come back here for her GYN care anymore.

## 2013-09-30 ENCOUNTER — Encounter: Payer: Self-pay | Admitting: Obstetrics & Gynecology

## 2013-09-30 NOTE — Telephone Encounter (Signed)
Discharge letter is written.  Please print for me to sign.  This needs to be sent certified mail.

## 2013-10-02 ENCOUNTER — Ambulatory Visit: Payer: No Typology Code available for payment source | Admitting: Obstetrics and Gynecology

## 2013-10-16 NOTE — Telephone Encounter (Signed)
Discharge letter was mailed 10-04-13.  Encounter closed.

## 2014-08-24 ENCOUNTER — Ambulatory Visit: Payer: Self-pay | Admitting: Obstetrics and Gynecology

## 2015-04-18 ENCOUNTER — Other Ambulatory Visit (HOSPITAL_COMMUNITY)
Admission: RE | Admit: 2015-04-18 | Discharge: 2015-04-18 | Disposition: A | Discharge status: Home / Self Care | Payer: BLUE CROSS/BLUE SHIELD | Source: Ambulatory Visit | Attending: Nurse Practitioner | Admitting: Nurse Practitioner

## 2015-04-18 ENCOUNTER — Other Ambulatory Visit: Payer: Self-pay | Admitting: Nurse Practitioner

## 2015-04-18 DIAGNOSIS — Z01419 Encounter for gynecological examination (general) (routine) without abnormal findings: Secondary | ICD-10-CM | POA: Diagnosis not present

## 2015-04-18 DIAGNOSIS — Z113 Encounter for screening for infections with a predominantly sexual mode of transmission: Secondary | ICD-10-CM | POA: Diagnosis present

## 2015-04-23 LAB — CYTOLOGY - PAP

## 2016-05-21 LAB — CBC AND DIFFERENTIAL
HCT: 38 (ref 36–46)
HEMOGLOBIN: 12.8 (ref 12.0–16.0)
Platelets: 230 (ref 150–399)
WBC: 7.7

## 2016-05-21 LAB — VITAMIN D 25 HYDROXY (VIT D DEFICIENCY, FRACTURES): Vit D, 25-Hydroxy: 36.6

## 2016-05-21 LAB — HEPATIC FUNCTION PANEL
ALK PHOS: 43 (ref 25–125)
ALT: 12 (ref 7–35)
AST: 17 (ref 13–35)
Bilirubin, Total: 0.6

## 2016-05-21 LAB — LIPID PANEL
Cholesterol: 152 (ref 0–200)
HDL: 54 (ref 35–70)
LDL CALC: 89
Triglycerides: 41 (ref 40–160)

## 2016-05-21 LAB — BASIC METABOLIC PANEL
BUN: 7 (ref 4–21)
Creatinine: 0.7 (ref 0.5–1.1)
GLUCOSE: 79
Potassium: 3.8 (ref 3.4–5.3)
SODIUM: 139 (ref 137–147)

## 2016-05-21 LAB — VITAMIN B12: Vitamin B-12: 346

## 2016-07-22 ENCOUNTER — Emergency Department (HOSPITAL_COMMUNITY): Payer: No Typology Code available for payment source

## 2016-07-22 ENCOUNTER — Emergency Department (HOSPITAL_COMMUNITY)
Admission: EM | Admit: 2016-07-22 | Discharge: 2016-07-23 | Disposition: A | Discharge status: Home / Self Care | Payer: No Typology Code available for payment source | Attending: Emergency Medicine | Admitting: Emergency Medicine

## 2016-07-22 ENCOUNTER — Encounter (HOSPITAL_COMMUNITY): Payer: Self-pay

## 2016-07-22 DIAGNOSIS — R1030 Lower abdominal pain, unspecified: Secondary | ICD-10-CM | POA: Diagnosis present

## 2016-07-22 DIAGNOSIS — Z79899 Other long term (current) drug therapy: Secondary | ICD-10-CM | POA: Insufficient documentation

## 2016-07-22 DIAGNOSIS — E860 Dehydration: Secondary | ICD-10-CM | POA: Diagnosis not present

## 2016-07-22 DIAGNOSIS — J45909 Unspecified asthma, uncomplicated: Secondary | ICD-10-CM | POA: Insufficient documentation

## 2016-07-22 DIAGNOSIS — K5900 Constipation, unspecified: Secondary | ICD-10-CM | POA: Diagnosis not present

## 2016-07-22 DIAGNOSIS — Z87891 Personal history of nicotine dependence: Secondary | ICD-10-CM | POA: Diagnosis not present

## 2016-07-22 LAB — COMPREHENSIVE METABOLIC PANEL
ALBUMIN: 4.5 g/dL (ref 3.5–5.0)
ALK PHOS: 45 U/L (ref 38–126)
ALT: 26 U/L (ref 14–54)
AST: 27 U/L (ref 15–41)
Anion gap: 11 (ref 5–15)
BUN: 15 mg/dL (ref 6–20)
CALCIUM: 9.1 mg/dL (ref 8.9–10.3)
CO2: 23 mmol/L (ref 22–32)
CREATININE: 0.57 mg/dL (ref 0.44–1.00)
Chloride: 102 mmol/L (ref 101–111)
GFR calc Af Amer: >60 mL/min (ref >60)
GFR calc non Af Amer: >60 mL/min (ref >60)
GLUCOSE: 109 mg/dL — AB (ref 65–99)
Potassium: 3.1 mmol/L — ABNORMAL LOW (ref 3.5–5.1)
SODIUM: 136 mmol/L (ref 135–145)
Total Bilirubin: 0.5 mg/dL (ref 0.3–1.2)
Total Protein: 7.2 g/dL (ref 6.5–8.1)

## 2016-07-22 LAB — URINALYSIS, ROUTINE W REFLEX MICROSCOPIC
BILIRUBIN URINE: NEGATIVE
GLUCOSE, UA: NEGATIVE mg/dL
Ketones, ur: 20 mg/dL — AB
Leukocytes, UA: NEGATIVE
Nitrite: NEGATIVE
PH: 7 (ref 5.0–8.0)
Protein, ur: NEGATIVE mg/dL
SPECIFIC GRAVITY, URINE: 1.021 (ref 1.005–1.030)
Squamous Epithelial / LPF: NONE SEEN
WBC, UA: NONE SEEN WBC/hpf (ref 0–5)

## 2016-07-22 LAB — WET PREP, GENITAL
CLUE CELLS WET PREP: NONE SEEN
SPERM: NONE SEEN
TRICH WET PREP: NONE SEEN
Yeast Wet Prep HPF POC: NONE SEEN

## 2016-07-22 LAB — LIPASE, BLOOD: Lipase: 24 U/L (ref 11–51)

## 2016-07-22 LAB — CBC
HCT: 36.4 % (ref 36.0–46.0)
Hemoglobin: 12.6 g/dL (ref 12.0–15.0)
MCH: 31.5 pg (ref 26.0–34.0)
MCHC: 34.6 g/dL (ref 30.0–36.0)
MCV: 91 fL (ref 78.0–100.0)
PLATELETS: 302 10*3/uL (ref 150–400)
RBC: 4 MIL/uL (ref 3.87–5.11)
RDW: 12.5 % (ref 11.5–15.5)
WBC: 12 10*3/uL — ABNORMAL HIGH (ref 4.0–10.5)

## 2016-07-22 LAB — I-STAT BETA HCG BLOOD, ED (MC, WL, AP ONLY): I-stat hCG, quantitative: <5 m[IU]/mL (ref <5)

## 2016-07-22 MED ORDER — OXYCODONE-ACETAMINOPHEN 5-325 MG PO TABS
1.0000 | ORAL_TABLET | ORAL | Status: DC | PRN
  Administered 2016-07-22: 1 via ORAL
  Filled 2016-07-22: qty 1

## 2016-07-22 MED ORDER — POTASSIUM CHLORIDE CRYS ER 20 MEQ PO TBCR
40.0000 meq | EXTENDED_RELEASE_TABLET | Freq: Once | ORAL | Status: AC
Start: 2016-07-22 — End: 2016-07-23
  Administered 2016-07-23: 40 meq via ORAL
  Filled 2016-07-22: qty 2

## 2016-07-22 NOTE — ED Provider Notes (Signed)
[facility] DEPT Provider Note   CSN: [id number] Arrival date & time: 07/22/16  1921  By signing my name below, I, Evelene Croon, attest that this documentation has been prepared under the direction and in the presence of [employer], PA-C. Electronically Signed: Evelene Croon, Scribe. 07/22/2016. 11:10 PM.  History   Chief Complaint Chief Complaint  Patient presents with  . Abdominal Pain     The history is provided by the patient. No language interpreter was used.     HPI Comments:  Tammy Knight is a 26 y.o. female with a history of UTI, and chlamydia,  who presents to the Emergency Department complaining of worsening cramping lower abdominal pain x today. She describes sharp "intense" pain that waxes and wanes in severity.  She reports associated lower back pain since this afternoon. She notes her menstrual period began 5 days ago. She was concerned her pain may have been due to constipation so she gave herself an enema with relief of her constipation but no relief of her abdominal pain. Her last BM before the enema was yesterday but states they have been small balls and that her BM recently have been like that. She denies fever and dysuria. No recent travel outside the country or recent antibiotic use. Pt has a PSHx of an abdominoplasty. Pt states she currently takes Paxil and in the past has had constipation due to this medication. She drinks ~ 3 bottles of water a day and also drinks caffeine.   Past Medical History:  Diagnosis Date  . Asthma   . Chlamydia   . UTI (urinary tract infection)     There are no active problems to display for this patient.   Past Surgical History:  Procedure Laterality Date  . ABDOMINOPLASTY  2014  . TONSILLECTOMY      OB History    Gravida Para Term Preterm AB Living   0 0 0 0 0 0   SAB TAB Ectopic Multiple Live Births   0 0 0 0         Home Medications    Prior to Admission medications   Medication Sig Start Date End  Date Taking? Authorizing Provider  fexofenadine (ALLEGRA) 180 MG tablet Take 180 mg by mouth daily.   Yes Historical Provider, MD  ibuprofen (ADVIL,MOTRIN) 200 MG tablet Take 200 mg by mouth every 6 (six) hours as needed for pain (pain).   Yes Historical Provider, MD  PARoxetine (PAXIL) 20 MG tablet Take 20 mg by mouth daily.   Yes Historical Provider, MD  Prenatal Vit-Fe Fumarate-FA (PRENATAL MULTIVITAMIN) TABS tablet Take 1 tablet by mouth daily at 12 noon.   Yes Historical Provider, MD  Norethindrone-Ethinyl Estradiol-Fe Biphas (LO LOESTRIN FE) 1 MG-10 MCG / 10 MCG tablet Take 1 tablet by mouth daily. Patient not taking: Reported on 07/22/2016 09/25/13   Megan Salon, MD    Family History Family History  Problem Relation Age of Onset  . Heart attack Father     2014  . COPD Mother     Social History Social History  Substance Use Topics  . Smoking status: Former Research scientist (life sciences)  . Smokeless tobacco: Never Used  . Alcohol use No     Allergies   Penicillins and Bee venom   Review of Systems Review of Systems  Constitutional: Negative for fever.  Gastrointestinal: Positive for abdominal pain and constipation. Negative for vomiting.  Genitourinary: Negative for dysuria.  Musculoskeletal: Positive for back pain.  All other systems reviewed and  are negative.   Physical Exam Updated Vital Signs BP 116/66 (BP Location: Right Arm)   Pulse (!) 56   Temp 98.5 F (36.9 C) (Oral)   Resp 16   Ht 5\' 5"  (1.651 m)   Wt 179 lb (81.2 kg)   LMP 07/18/2016 Comment: neg preg test  SpO2 99%   BMI 29.79 kg/m   Physical Exam  Constitutional: She appears well-developed and well-nourished. No distress.  HENT:  Head: Normocephalic and atraumatic.  Eyes: Conjunctivae are normal.  Neck: Normal range of motion.  Cardiovascular: Normal rate, regular rhythm, normal heart sounds and intact distal pulses.   No murmur heard. Pulmonary/Chest: Effort normal and breath sounds normal. No respiratory  distress. She has no wheezes.  Abdominal: Soft. Bowel sounds are normal. There is no tenderness. There is no rebound, no guarding and no CVA tenderness. Hernia confirmed negative in the right inguinal area and confirmed negative in the left inguinal area.  Genitourinary: Uterus normal. No labial fusion. There is no rash, tenderness or lesion on the right labia. There is no rash, tenderness or lesion on the left labia. Uterus is not deviated, not enlarged, not fixed and not tender. Cervix exhibits no motion tenderness, no discharge and no friability. Right adnexum displays no mass, no tenderness and no fullness. Left adnexum displays no mass, no tenderness and no fullness. No erythema, tenderness or bleeding in the vagina. No foreign body in the vagina. No signs of injury around the vagina. Vaginal discharge (scant white non-odorous ) found.  Genitourinary Comments: Chaperone was present for exam which was performed with no discomfort or complications.   Musculoskeletal: Normal range of motion. She exhibits no edema.  Lymphadenopathy:       Right: No inguinal adenopathy present.       Left: No inguinal adenopathy present.  Neurological: She is alert.  Skin: Skin is warm and dry. She is not diaphoretic. No erythema.  Psychiatric: She has a normal mood and affect.  Nursing note and vitals reviewed.    ED Treatments / Results  DIAGNOSTIC STUDIES:  Oxygen Saturation is 99% on RA, normal by my interpretation.    COORDINATION OF CARE:  11:08 PM Discussed treatment plan with pt at bedside and pt agreed to plan.  Labs (all labs ordered are listed, but only abnormal results are displayed) Labs Reviewed  WET PREP, GENITAL - Abnormal; Notable for the following:       Result Value   WBC, Wet Prep HPF POC MODERATE (*)    All other components within normal limits  COMPREHENSIVE METABOLIC PANEL - Abnormal; Notable for the following:    Potassium 3.1 (*)    Glucose, Bld 109 (*)    All other  components within normal limits  CBC - Abnormal; Notable for the following:    WBC 12.0 (*)    All other components within normal limits  URINALYSIS, ROUTINE W REFLEX MICROSCOPIC - Abnormal; Notable for the following:    APPearance CLOUDY (*)    Hgb urine dipstick MODERATE (*)    Ketones, ur 20 (*)    Bacteria, UA RARE (*)    All other components within normal limits  LIPASE, BLOOD  I-STAT BETA HCG BLOOD, ED ([facility], [facility], [facility] ONLY)  GC/CHLAMYDIA PROBE AMP ([facility]) NOT AT [facility]    Radiology Dg Abd Acute W/chest  Result Date: 07/22/2016 CLINICAL DATA:  Low back pain and abdominal pain EXAM: DG ABDOMEN ACUTE W/ 1V CHEST COMPARISON:  05/01/2012 FINDINGS: Single-view chest demonstrates  no acute infiltrate or effusion. Normal heart size. No pneumothorax. Supine and upright views of the abdomen demonstrate no free air beneath the diaphragm. Nonobstructed bowel gas pattern. Faint calcifications in the right upper quadrant IMPRESSION: 1. No radiographic evidence for acute cardiopulmonary abnormality 2. Nonobstructed bowel-gas pattern 3. Faint calcifications in the right upper quadrant could reflect dystrophic liver calcification, costochondral calcification, less likely stones in the gallbladder Electronically Signed   By: Donavan Foil M.D.   On: 07/22/2016 22:47    Procedures Procedures (including critical care time)  Medications Ordered in ED Medications  oxyCODONE-acetaminophen (PERCOCET/ROXICET) 5-325 MG per tablet 1 tablet (1 tablet Oral Given 07/22/16 2003)  potassium chloride SA (K-DUR,KLOR-CON) CR tablet 40 mEq (not administered)     Initial Impression / Assessment and Plan / ED Course  I have reviewed the triage vital signs and the nursing notes.  Pertinent labs & imaging results that were available during my care of the patient were reviewed by me and considered in my medical decision making (see chart for details).      Patient presents with generalized abdominal pain.  Abdomen is  soft and nontender on my exam. Mild hypokalemia noted. Mild leukocytosis noted. Abdominal plain films are without evidence of obstruction or perforation. Large stool burden is noted. Patient reports constipation. Urinalysis without evidence of urinary tract infection; however, it does have ketones.  Suspect dehydration as part of the patient's cause for constipation. Highly doubt appendicitis, diverticulitis, colitis.  No cervical motion tenderness or adnexal tenderness on pelvic exam. Doubt PID.  Patient with white blood cells on wet prep however she denies being high risk for STDs and does not wish to be treated at this time. Patient is afebrile without tachycardia or hypotension. She is well appearing. Discussed conservative therapies for constipation and oral rehydration. She is tolerating by mouth in the emergency department.  H&H family state understanding and are in agreement with the plan.  Final Clinical Impressions(s) / ED Diagnoses   Final diagnoses:  Lower abdominal pain    New Prescriptions New Prescriptions   No medications on file   I personally performed the services described in this documentation, which was scribed in my presence. The recorded information has been reviewed and is accurate.     Jarrett Soho Allysa Governale, PA-C 07/23/16 [id number]    Quintella Reichert, MD 07/23/16 [phone]

## 2016-07-22 NOTE — ED Triage Notes (Signed)
PT C/O LOWER BACK PAIN RADIATING TO LOWER ABDOMEN SINCE THIS AFTERNOON. PT STS THOUGHT DHE WAS CONSTIPATED, BUT NOTHING HAPPENED WHEN SHE TOOK A LAXATIVE. PT SCREAMING IN TRIAGE. DENIES N/V/D.

## 2016-07-23 LAB — GC/CHLAMYDIA PROBE AMP (~~LOC~~) NOT AT ARMC
CHLAMYDIA, DNA PROBE: NEGATIVE
NEISSERIA GONORRHEA: NEGATIVE

## 2016-07-23 MED ORDER — POLYETHYLENE GLYCOL 3350 17 GM/SCOOP PO POWD: 17.0000 g | Freq: Two times a day (BID) | ORAL | 0 refills | Status: DC

## 2016-07-23 NOTE — Discharge Instructions (Signed)
1. Medications: MiraLAX, usual home medications 2. Treatment: rest, drink plenty of fluids, advance diet slowly 3. Follow Up: Please followup with your primary doctor in 3-5 days for discussion of your diagnoses and further evaluation after today's visit; if you do not have a primary care doctor use the resource guide provided to find one; Please return to the ER for persistent vomiting, high fevers or worsening symptoms

## 2017-07-15 ENCOUNTER — Encounter: Payer: Self-pay | Admitting: Family Medicine

## 2017-07-15 ENCOUNTER — Ambulatory Visit (INDEPENDENT_AMBULATORY_CARE_PROVIDER_SITE_OTHER): Payer: 59 | Admitting: Family Medicine

## 2017-07-15 VITALS — BP 122/78 | HR 72 | Temp 98.6°F | Ht 65.0 in | Wt 191.8 lb

## 2017-07-15 DIAGNOSIS — M25531 Pain in right wrist: Secondary | ICD-10-CM

## 2017-07-15 DIAGNOSIS — G8929 Other chronic pain: Secondary | ICD-10-CM | POA: Diagnosis not present

## 2017-07-15 DIAGNOSIS — M79604 Pain in right leg: Secondary | ICD-10-CM

## 2017-07-15 DIAGNOSIS — M25571 Pain in right ankle and joints of right foot: Secondary | ICD-10-CM | POA: Diagnosis not present

## 2017-07-15 DIAGNOSIS — M25561 Pain in right knee: Secondary | ICD-10-CM | POA: Diagnosis not present

## 2017-07-15 MED ORDER — DICLOFENAC SODIUM 75 MG PO TBEC: 75.0000 mg | DELAYED_RELEASE_TABLET | Freq: Two times a day (BID) | ORAL | 0 refills | Status: DC

## 2017-07-15 NOTE — Progress Notes (Signed)
Subjective:  Tammy Knight is a 27 y.o. female who presents today with a chief complaint of right wrist pain and to establish care  HPI:  Right Wrist Pain, New problem Started about a year ago.  Located throughout her right wrist.  No history of trauma or other obvious precipitating events.  Symptoms have been stable for the past several months.  She was diagnosed with carpal tunnel syndrome and tried using a wrist splint for a period of time.  This helped a little bit, however symptoms have returned.  She occasionally has some numbness in her first 3 digits.  No other treaments tried.   Right Knee Pain, New Problem  Patient suffered severe knee sprain about a year ago.  She was seen at [facility] orth[facility]ics.  Had an MRI done which was negative.  Since then has had intermittent pain all over her knee.  Pain described as an achy sensation.  No locking, popping, or catching.  Occasionally has a small amount of swelling to her right leg.  Right Ankle Pain, New Problem Also started about a year ago.  Pain mostly located to the outer aspect of her right ankle.  Achy in nature.  Intermittent in nature.  No obvious history of trauma or other precipitating events.  ROS: Per HPI, otherwise a complete review of systems was negative.   PMH:  The following were reviewed and entered/updated in epic: Past Medical History:  Diagnosis Date  . Asthma   . Chlamydia   . UTI (urinary tract infection)    Patient Active Problem List   Diagnosis Date Noted  . Right wrist pain 07/15/2017  . Right leg pain 07/15/2017   Past Surgical History:  Procedure Laterality Date  . ABDOMINOPLASTY  2014  . TONSILLECTOMY      Family History  Problem Relation Age of Onset  . Heart attack Father        2014  . COPD Mother   . Glaucoma Mother     Medications- reviewed and updated Current Outpatient Medications  Medication Sig Dispense Refill  . diclofenac (VOLTAREN) 75 MG EC tablet Take 1 tablet  (75 mg total) by mouth 2 (two) times daily. 30 tablet 0   No current facility-administered medications for this visit.     Allergies-reviewed and updated Allergies  Allergen Reactions  . Penicillins Anaphylaxis  . Bee Venom     Social History   Socioeconomic History  . Marital status: Single    Spouse name: Not on file  . Number of children: Not on file  . Years of education: Not on file  . Highest education level: Not on file  Occupational History  . Not on file  Social Needs  . Financial resource strain: Not on file  . Food insecurity:    Worry: Not on file    Inability: Not on file  . Transportation needs:    Medical: Not on file    Non-medical: Not on file  Tobacco Use  . Smoking status: Former Research scientist (life sciences)  . Smokeless tobacco: Never Used  Substance and Sexual Activity  . Alcohol use: No    Comment: Occasional  . Drug use: No  . Sexual activity: Yes    Partners: Male    Birth control/protection: Condom  Lifestyle  . Physical activity:    Days per week: Not on file    Minutes per session: Not on file  . Stress: Not on file  Relationships  . Social connections:  Talks on phone: Not on file    Gets together: Not on file    Attends religious service: Not on file    Active member of club or organization: Not on file    Attends meetings of clubs or organizations: Not on file    Relationship status: Not on file  Other Topics Concern  . Not on file  Social History Narrative  . Not on file     Objective:  Physical Exam: BP 122/78 (BP Location: Left Arm, Patient Position: Sitting, Cuff Size: Normal)   Pulse 72   Temp 98.6 F (37 C) (Oral)   Ht 5\' 5"  (1.651 m)   Wt 191 lb 12.8 oz (87 kg)   SpO2 100%   BMI 31.92 kg/m   Gen: NAD, resting comfortably CV: RRR with no murmurs appreciated Pulm: NWOB, CTAB with no crackles, wheezes, or rhonchi GI: Normal bowel sounds present. Soft, Nontender, Nondistended. MSK:  -Neck:  No deformities.  Full range of motion.   Spurling negative bilaterally. -Right wrist: No deformities.  Nontender to palpation.  Full range of motion.  Strength 5 out of 5 in all directions.  Tinel sign positive.  Phalen negative.  Neurovascularly intact distally. -Right hip: No deformities.  Full range of motion.  Hip abductors 4+ out of 5, otherwise 5 out of 5 strength throughout.  Pain elicited with resisted hip flexion. -Right knee: No deformities.  Full range of motion.  No crepitus.  Strength 5 out of 5 in all directions.  Posterior and anterior drawer signs negative.  Lockman negative.  McMurray negative. -Right ankle: No deformities.  Full range of motion.  Strength 5 out of 5 in all directions.  Tender to palpation along lateral malleolus.  Mild tenderness with talar tilt.  Anterior drawer sign negative.  Neurovascularly intact distally. Skin: Warm, dry Neuro: Grossly normal, moves all extremities Psych: Normal affect and thought content  Assessment/Plan:  Right wrist pain Consistent with carpal tunnel syndrome.  No red flag signs or symptoms.  Start 1-2-week course of diclofenac.  Placed patient in cockup wrist splint.  Consider follow-up with sports medicine if not improving with conservative measures.  Right leg pain Likely multifactorial and has pain at multiple sites including knee and ankle.  She has a history of a severe knee sprain about a year ago which is likely contributing.  She has quite a bit of weakness with her hip abductor and pain with resisted hip flexion.  Discussed home exercise program focusing on strengthening hip and knee musculature.  As noted above, we will also start diclofenac for the next 1-2 weeks.  If not improving with conservative measures, will need referral to sports medicine.  May need imaging if symptoms do not improve with conservative issues.   Preventative Healthcare Obtain records from previous PCP.  Will follow-up soon for CPE.  Algis Greenhouse. [name], MD 07/15/2017 2:33 PM

## 2017-07-15 NOTE — Patient Instructions (Addendum)
Start the voltaren.   Work on the exercises.  Let me know if not improving or worsening over the next few weeks.  Please come back soon your your annual physical with pap test.  Take care, Dr [name]

## 2017-07-15 NOTE — Assessment & Plan Note (Signed)
Consistent with carpal tunnel syndrome.  No red flag signs or symptoms.  Start 1-2-week course of diclofenac.  Placed patient in cockup wrist splint.  Consider follow-up with sports medicine if not improving with conservative measures.

## 2017-07-15 NOTE — Assessment & Plan Note (Signed)
Likely multifactorial and has pain at multiple sites including knee and ankle.  She has a history of a severe knee sprain about a year ago which is likely contributing.  She has quite a bit of weakness with her hip abductor and pain with resisted hip flexion.  Discussed home exercise program focusing on strengthening hip and knee musculature.  As noted above, we will also start diclofenac for the next 1-2 weeks.  If not improving with conservative measures, will need referral to sports medicine.  May need imaging if symptoms do not improve with conservative issues.

## 2017-08-13 ENCOUNTER — Ambulatory Visit (INDEPENDENT_AMBULATORY_CARE_PROVIDER_SITE_OTHER): Payer: 59 | Admitting: Family Medicine

## 2017-08-13 ENCOUNTER — Encounter: Payer: Self-pay | Admitting: Family Medicine

## 2017-08-13 VITALS — BP 104/68 | HR 50 | Temp 98.2°F | Ht 65.0 in | Wt 187.0 lb

## 2017-08-13 DIAGNOSIS — Z13220 Encounter for screening for lipoid disorders: Secondary | ICD-10-CM

## 2017-08-13 DIAGNOSIS — Z0001 Encounter for general adult medical examination with abnormal findings: Secondary | ICD-10-CM | POA: Diagnosis not present

## 2017-08-13 DIAGNOSIS — F419 Anxiety disorder, unspecified: Secondary | ICD-10-CM | POA: Diagnosis not present

## 2017-08-13 DIAGNOSIS — Z23 Encounter for immunization: Secondary | ICD-10-CM | POA: Diagnosis not present

## 2017-08-13 DIAGNOSIS — K5900 Constipation, unspecified: Secondary | ICD-10-CM | POA: Insufficient documentation

## 2017-08-13 DIAGNOSIS — Z114 Encounter for screening for human immunodeficiency virus [HIV]: Secondary | ICD-10-CM

## 2017-08-13 LAB — CBC
HCT: 39.6 % (ref 36.0–46.0)
Hemoglobin: 13.5 g/dL (ref 12.0–15.0)
MCHC: 34.1 g/dL (ref 30.0–36.0)
MCV: 94.3 fl (ref 78.0–100.0)
Platelets: 255 10*3/uL (ref 150.0–400.0)
RBC: 4.2 Mil/uL (ref 3.87–5.11)
RDW: 12.7 % (ref 11.5–15.5)
WBC: 7.6 10*3/uL (ref 4.0–10.5)

## 2017-08-13 LAB — COMPREHENSIVE METABOLIC PANEL
ALT: 13 U/L (ref 0–35)
AST: 16 U/L (ref 0–37)
Albumin: 4.5 g/dL (ref 3.5–5.2)
Alkaline Phosphatase: 45 U/L (ref 39–117)
BUN: 7 mg/dL (ref 6–23)
CHLORIDE: 105 meq/L (ref 96–112)
CO2: 29 meq/L (ref 19–32)
Calcium: 9.9 mg/dL (ref 8.4–10.5)
Creatinine, Ser: 0.56 mg/dL (ref 0.40–1.20)
GFR: 139.16 mL/min (ref >60.00)
GLUCOSE: 88 mg/dL (ref 70–99)
POTASSIUM: 4 meq/L (ref 3.5–5.1)
SODIUM: 141 meq/L (ref 135–145)
TOTAL PROTEIN: 7.2 g/dL (ref 6.0–8.3)
Total Bilirubin: 0.5 mg/dL (ref 0.2–1.2)

## 2017-08-13 LAB — LIPID PANEL
CHOL/HDL RATIO: 3
Cholesterol: 176 mg/dL (ref 0–200)
HDL: 58.1 mg/dL (ref >39.00)
LDL CALC: 110 mg/dL — AB (ref 0–99)
NONHDL: 117.79
Triglycerides: 39 mg/dL (ref 0.0–149.0)
VLDL: 7.8 mg/dL (ref 0.0–40.0)

## 2017-08-13 LAB — TSH: TSH: 1.69 u[IU]/mL (ref 0.35–4.50)

## 2017-08-13 MED ORDER — CLONAZEPAM 0.5 MG PO TABS
0.5000 mg | ORAL_TABLET | Freq: Two times a day (BID) | ORAL | 1 refills | Status: DC | PRN
Start: 2017-08-13 — End: 2017-12-13

## 2017-08-13 NOTE — Progress Notes (Signed)
Subjective:  Tammy Knight is a 27 y.o. female who presents today for her annual comprehensive physical exam.    HPI:  She has no acute complaints today.   She has 2 chronic problem outlined below 1.  Depression/anxiety.  Several year history.  Has been on several medications in the past including Paxil, Effexor, and sertraline.  Work has been particularly stressful for her and she occasionally has panic attacks at work. 2.  Constipation.  Several year history.  Occasionally has abdominal pains and cramps.  Depression screen PHQ 2/9 08/13/2017  Decreased Interest 1  Down, Depressed, Hopeless 1  PHQ - 2 Score 2  Altered sleeping 1  Tired, decreased energy 1  Change in appetite 1  Feeling bad or failure about yourself  1  Trouble concentrating 1  Moving slowly or fidgety/restless 0  Suicidal thoughts 0  PHQ-9 Score 7  Difficult doing work/chores Very difficult    GAD 7 : Generalized Anxiety Score 08/13/2017  Nervous, Anxious, on Edge 3  Control/stop worrying 2  Worry too much - different things 2  Trouble relaxing 2  Restless 0  Easily annoyed or irritable 3  Afraid - awful might happen 2  Total GAD 7 Score 14  Anxiety Difficulty Very difficult    ROS: No SI or HI.   Lifestyle Diet: Trying intermittent fasting.  Exercise: Works out 3 times per week.   ROS: Per HPI, otherwise a complete review of systems was negative.   PMH:  The following were reviewed and entered/updated in epic: Past Medical History:  Diagnosis Date  . Asthma   . Chlamydia   . Depression   . GERD (gastroesophageal reflux disease)   . Hay fever   . UTI (urinary tract infection)    Patient Active Problem List   Diagnosis Date Noted  . Anxiety disorder 08/13/2017  . Constipation 08/13/2017  . Right wrist pain 07/15/2017  . Right leg pain 07/15/2017   Past Surgical History:  Procedure Laterality Date  . ABDOMINOPLASTY  2014  . TONSILLECTOMY     Family History  Problem  Relation Age of Onset  . Heart attack Father        2014  . Alcohol abuse Father   . Depression Father   . Hyperlipidemia Father   . Hypertension Father   . COPD Mother   . Glaucoma Mother   . Arthritis Mother   . Depression Mother   . Depression Sister   . Hearing loss Sister   . Diabetes Paternal Grandfather     Medications- reviewed and updated Current Outpatient Medications  Medication Sig Dispense Refill  . clonazePAM (KLONOPIN) 0.5 MG tablet Take 1 tablet (0.5 mg total) by mouth 2 (two) times daily as needed for anxiety. 20 tablet 1  . diclofenac (VOLTAREN) 75 MG EC tablet Take 1 tablet (75 mg total) by mouth 2 (two) times daily. 30 tablet 0   No current facility-administered medications for this visit.     Allergies-reviewed and updated Allergies  Allergen Reactions  . Penicillins Anaphylaxis  . Bee Venom     Social History   Socioeconomic History  . Marital status: Single    Spouse name: Not on file  . Number of children: Not on file  . Years of education: Not on file  . Highest education level: Not on file  Occupational History  . Not on file  Social Needs  . Financial resource strain: Not on file  . Food insecurity:  Worry: Not on file    Inability: Not on file  . Transportation needs:    Medical: Not on file    Non-medical: Not on file  Tobacco Use  . Smoking status: Former Research scientist (life sciences)  . Smokeless tobacco: Never Used  Substance and Sexual Activity  . Alcohol use: No    Comment: Occasional  . Drug use: No  . Sexual activity: Yes    Partners: Male    Birth control/protection: Condom  Lifestyle  . Physical activity:    Days per week: Not on file    Minutes per session: Not on file  . Stress: Not on file  Relationships  . Social connections:    Talks on phone: Not on file    Gets together: Not on file    Attends religious service: Not on file    Active member of club or organization: Not on file    Attends meetings of clubs or organizations:  Not on file    Relationship status: Not on file  Other Topics Concern  . Not on file  Social History Narrative  . Not on file    Objective:  Physical Exam: BP 104/68 (BP Location: Left Arm)   Pulse (!) 50   Temp 98.2 F (36.8 C) (Oral)   Ht 5\' 5"  (1.651 m)   Wt 187 lb (84.8 kg)   SpO2 99%   BMI 31.12 kg/m   Body mass index is 31.12 kg/m. Wt Readings from Last 3 Encounters:  08/13/17 187 lb (84.8 kg)  07/15/17 191 lb 12.8 oz (87 kg)  07/22/16 179 lb (81.2 kg)   Gen: NAD, resting comfortably HEENT: TMs normal bilaterally. OP clear. No thyromegaly noted.  CV: RRR with no murmurs appreciated Pulm: NWOB, CTAB with no crackles, wheezes, or rhonchi GI: Normal bowel sounds present. Soft, Nontender, Nondistended. MSK: no edema, cyanosis, or clubbing noted Skin: warm, dry Neuro: CN2-12 grossly intact. Strength 5/5 in upper and lower extremities. Reflexes symmetric and intact bilaterally.  Psych: Normal affect and thought content  Assessment/Plan:  Anxiety disorder Discussed treatment options with patient including SSRI, BuSpar, hydroxyzine.  She declined starting daily medication today.  We will start Klonopin 0.5 mg twice daily as needed.  She will follow-up with me in about 4 to 6 weeks.  Advised patient possible side effects associated with his medication and that this ideally would not be a long-term medication.  Patient voiced understanding.  Constipation Associated with underlying anxiety disorder.  We will proceed with conservative management including good oral hydration, fiber supplementation, and stool softener.  Also advised her to use MiraLAX as needed.  Follow-up in 4 to 6 weeks.  Preventative Healthcare: Check lipid panel and HIV antibody.  Up-to-date on Pap smear.  Tdap given today.  Patient Counseling(The following topics were reviewed and/or handout was given):  -Nutrition: Stressed importance of moderation in sodium/caffeine intake, saturated fat and  cholesterol, caloric balance, sufficient intake of fresh fruits, vegetables, and fiber.  -Stressed the importance of regular exercise.   -Substance Abuse: Discussed cessation/primary prevention of tobacco, alcohol, or other drug use; driving or other dangerous activities under the influence; availability of treatment for abuse.   -Injury prevention: Discussed safety belts, safety helmets, smoke detector, smoking near bedding or upholstery.   -Sexuality: Discussed sexually transmitted diseases, partner selection, use of condoms, avoidance of unintended pregnancy and contraceptive alternatives.   -Dental health: Discussed importance of regular tooth brushing, flossing, and dental visits.  -Health maintenance and immunizations reviewed. Please refer to  Health maintenance section.  Return to care in 1 year for next preventative visit.   Algis Greenhouse. [name], MD 08/13/2017 5:44 PM

## 2017-08-13 NOTE — Progress Notes (Signed)
.  J

## 2017-08-13 NOTE — Assessment & Plan Note (Signed)
Associated with underlying anxiety disorder.  We will proceed with conservative management including good oral hydration, fiber supplementation, and stool softener.  Also advised her to use MiraLAX as needed.  Follow-up in 4 to 6 weeks.

## 2017-08-13 NOTE — Patient Instructions (Addendum)
Please try to stay well-hydrated.  You can also try starting a fiber supplement, and a stool softener to help you with your bowel movements.  Please use MiraLAX as needed.  Please keep a diary of your abdominal symptoms.  We will start Klonopin today.  Please only use this as needed for severe anxiety or panic attacks.  We will also like you to see Lattie Haw soon.  Please come back to see me in 1 to 2 months for follow-up, or sooner as needed.   Preventive Care 18-39 Years, Female Preventive care refers to lifestyle choices and visits with your health care provider that can promote health and wellness. What does preventive care include?  A yearly physical exam. This is also called an annual well check.  Dental exams once or twice a year.  Routine eye exams. Ask your health care provider how often you should have your eyes checked.  Personal lifestyle choices, including: ? Daily care of your teeth and gums. ? Regular physical activity. ? Eating a healthy diet. ? Avoiding tobacco and drug use. ? Limiting alcohol use. ? Practicing safe sex. ? Taking vitamin and mineral supplements as recommended by your health care provider. What happens during an annual well check? The services and screenings done by your health care provider during your annual well check will depend on your age, overall health, lifestyle risk factors, and family history of disease. Counseling Your health care provider may ask you questions about your:  Alcohol use.  Tobacco use.  Drug use.  Emotional well-being.  Home and relationship well-being.  Sexual activity.  Eating habits.  Work and work Statistician.  Method of birth control.  Menstrual cycle.  Pregnancy history.  Screening You may have the following tests or measurements:  Height, weight, and BMI.  Diabetes screening. This is done by checking your blood sugar (glucose) after you have not eaten for a while (fasting).  Blood  pressure.  Lipid and cholesterol levels. These may be checked every 5 years starting at age 20.  Skin check.  Hepatitis C blood test.  Hepatitis B blood test.  Sexually transmitted disease (STD) testing.  BRCA-related cancer screening. This may be done if you have a family history of breast, ovarian, tubal, or peritoneal cancers.  Pelvic exam and Pap test. This may be done every 3 years starting at age 18. Starting at age 29, this may be done every 5 years if you have a Pap test in combination with an HPV test.  Discuss your test results, treatment options, and if necessary, the need for more tests with your health care provider. Vaccines Your health care provider may recommend certain vaccines, such as:  Influenza vaccine. This is recommended every year.  Tetanus, diphtheria, and acellular pertussis (Tdap, Td) vaccine. You may need a Td booster every 10 years.  Varicella vaccine. You may need this if you have not been vaccinated.  HPV vaccine. If you are 77 or younger, you may need three doses over 6 months.  Measles, mumps, and rubella (MMR) vaccine. You may need at least one dose of MMR. You may also need a second dose.  Pneumococcal 13-valent conjugate (PCV13) vaccine. You may need this if you have certain conditions and were not previously vaccinated.  Pneumococcal polysaccharide (PPSV23) vaccine. You may need one or two doses if you smoke cigarettes or if you have certain conditions.  Meningococcal vaccine. One dose is recommended if you are age 54-21 years and a Market researcher living in  a residence hall, or if you have one of several medical conditions. You may also need additional booster doses.  Hepatitis A vaccine. You may need this if you have certain conditions or if you travel or work in places where you may be exposed to hepatitis A.  Hepatitis B vaccine. You may need this if you have certain conditions or if you travel or work in places where you may  be exposed to hepatitis B.  Haemophilus influenzae type b (Hib) vaccine. You may need this if you have certain risk factors.  Talk to your health care provider about which screenings and vaccines you need and how often you need them. This information is not intended to replace advice given to you by your health care provider. Make sure you discuss any questions you have with your health care provider. Document Released: 06/02/2001 Document Revised: 12/25/2015 Document Reviewed: 02/05/2015 Elsevier Interactive Patient Education  [employer].

## 2017-08-13 NOTE — Assessment & Plan Note (Signed)
Discussed treatment options with patient including SSRI, BuSpar, hydroxyzine.  She declined starting daily medication today.  We will start Klonopin 0.5 mg twice daily as needed.  She will follow-up with me in about 4 to 6 weeks.  Advised patient possible side effects associated with his medication and that this ideally would not be a long-term medication.  Patient voiced understanding.

## 2017-08-14 LAB — HIV ANTIBODY (ROUTINE TESTING W REFLEX): HIV 1&2 Ab, 4th Generation: NONREACTIVE

## 2017-08-16 ENCOUNTER — Encounter: Payer: Self-pay | Admitting: Family Medicine

## 2017-08-16 NOTE — Progress Notes (Signed)
Dr Marigene Ehlers interpretation of your lab work:  HIV test is negative.  Thyroid levels normal. Kidney function, liver function, blood sugar, electrolytes are all normal. Blood counts are normal. Your "bad" cholesterol is a little elevated.  The rest of your cholesterol levels are normal.  We do not need to start any medications, but you should continue working on diet and exercise and we can recheck in 1 year.  If you have any additional questions, please give [place] a call or send [place] a message through [place].  Take care, Dr [name]

## 2017-08-17 NOTE — Telephone Encounter (Signed)
Spoke with patient and advised that medication was sent in last week to [facility].  Patient wanted medication sent to [facility].  I offered to have Dr. [name] re-send to [facility], but patient stated she would pick the medication up at [facility].    Relayed Dr. Marigene Ehlers message regarding patient's arm.  Patient verbalized understanding.  States her arm is feeling better today and she will continue to monitor it.  Asked her to let us know if she does not feel she is continuing to improve and we will bring her in for an office visit.  Patient verbalized understanding.

## 2017-10-18 ENCOUNTER — Encounter: Payer: Self-pay | Admitting: Family Medicine

## 2017-10-18 ENCOUNTER — Ambulatory Visit (INDEPENDENT_AMBULATORY_CARE_PROVIDER_SITE_OTHER): Payer: 59 | Admitting: Family Medicine

## 2017-10-18 VITALS — BP 116/72 | HR 73 | Temp 98.6°F | Ht 65.0 in | Wt 190.8 lb

## 2017-10-18 DIAGNOSIS — H5789 Other specified disorders of eye and adnexa: Secondary | ICD-10-CM | POA: Diagnosis not present

## 2017-10-18 MED ORDER — OLOPATADINE HCL 0.2 % OP SOLN: OPHTHALMIC | 0 refills | Status: DC

## 2017-10-18 NOTE — Patient Instructions (Signed)
It was very nice to see you today!  I did not see any scratches, abrasions, or other foreign bodies on your exam today.  Please start the Pataday.  You can also use cool compresses to the area.  Let me know if your symptoms worsen or do not improve over the next 5 to 7 days.  Take care, Dr [name]

## 2017-10-18 NOTE — Progress Notes (Signed)
   Subjective:  Tammy Knight is a 27 y.o. female who presents today for same-day appointment with a chief complaint of eye pain.   HPI:  Eye Pain, acute problem Started 5 days ago.  Located to her left eye.  No obvious precipitating events.  No trauma.  She has had redness and watery discharge.  Left eye is also very pruritic.  Feels like a grain of sand in her eye.  Patient is concerned that she has a scratch the area or have a foreign body stuck in there.  No other obvious alleviating or aggravating factors.  No changes in vision.  ROS: Per HPI  PMH: She reports that she has quit smoking. She has never used smokeless tobacco. She reports that she does not drink alcohol or use drugs.  Objective:  Physical Exam: BP 116/72 (BP Location: Left Arm, Patient Position: Sitting, Cuff Size: Normal)   Pulse 73   Temp 98.6 F (37 C) (Oral)   Ht 5\' 5"  (1.651 m)   Wt 190 lb 12.8 oz (86.5 kg)   SpO2 98%   BMI 31.75 kg/m   Gen: NAD, resting comfortably HEENT: Left eye with faint conjunctival erythema.  Extraocular eye movements intact.  Pupils equally round and reactive to light bilaterally.  No photophobia.  Fluoroscein exam was performed without any obvious abrasions, lacerations, or foreign bodies.  Assessment/Plan:  Conjunctivitis No red flag signs or symptoms.  Symptoms likely secondary to conjunctival irritation, likely allergic versus viral.  No red flag signs or symptoms.  She has mild erythema on exam, but her exam is otherwise normal.  Start Pataday drops.  Recommended cool compresses to the area.  Discussed reasons to return to care.  Follow-up as needed.  Algis Greenhouse. [name], MD 10/18/2017 4:39 PM

## 2017-10-19 ENCOUNTER — Other Ambulatory Visit: Payer: Self-pay

## 2017-10-19 ENCOUNTER — Encounter: Payer: Self-pay | Admitting: Family Medicine

## 2017-10-19 DIAGNOSIS — H5712 Ocular pain, left eye: Secondary | ICD-10-CM

## 2017-10-20 DIAGNOSIS — B309 Viral conjunctivitis, unspecified: Secondary | ICD-10-CM | POA: Diagnosis not present

## 2017-10-20 DIAGNOSIS — H579 Unspecified disorder of eye and adnexa: Secondary | ICD-10-CM | POA: Diagnosis not present

## 2017-10-20 DIAGNOSIS — Z973 Presence of spectacles and contact lenses: Secondary | ICD-10-CM | POA: Diagnosis not present

## 2017-12-13 ENCOUNTER — Encounter: Payer: Self-pay | Admitting: Family Medicine

## 2017-12-13 ENCOUNTER — Ambulatory Visit (INDEPENDENT_AMBULATORY_CARE_PROVIDER_SITE_OTHER): Payer: 59 | Admitting: Family Medicine

## 2017-12-13 VITALS — BP 122/70 | HR 65 | Temp 98.0°F | Ht 65.0 in | Wt 192.4 lb

## 2017-12-13 DIAGNOSIS — N943 Premenstrual tension syndrome: Secondary | ICD-10-CM

## 2017-12-13 DIAGNOSIS — M25511 Pain in right shoulder: Secondary | ICD-10-CM | POA: Diagnosis not present

## 2017-12-13 DIAGNOSIS — F3281 Premenstrual dysphoric disorder: Secondary | ICD-10-CM | POA: Insufficient documentation

## 2017-12-13 DIAGNOSIS — F419 Anxiety disorder, unspecified: Secondary | ICD-10-CM

## 2017-12-13 MED ORDER — PREDNISONE 20 MG PO TABS: 20.0000 mg | ORAL_TABLET | Freq: Every day | ORAL | 0 refills | Status: DC

## 2017-12-13 MED ORDER — CLONAZEPAM 0.5 MG PO TABS: 0.5000 mg | ORAL_TABLET | Freq: Two times a day (BID) | ORAL | 1 refills | Status: DC | PRN

## 2017-12-13 MED ORDER — ESCITALOPRAM OXALATE 10 MG PO TABS: 10.0000 mg | ORAL_TABLET | Freq: Every day | ORAL | 1 refills | Status: DC

## 2017-12-13 NOTE — Assessment & Plan Note (Signed)
Start Lexapro 10 mg daily.  Discussed possible side effects medication.

## 2017-12-13 NOTE — Progress Notes (Signed)
   Subjective:  Tammy Knight is a 27 y.o. female who presents today with a chief complaint of shoulder pain.   HPI:  Shoulder Pain, acute problem Started 5 days ago. No clear precipitating events. Getting worse over that time. Took aleve which help modestly.  She is also taken diclofenac without significant improvement.  She occasionally lifts weights.  She has no history of clotting overhead sports.  Symptoms make it very difficult for her to sleep.  Pain is worse when laying on her side.  Pain will sometimes radiate into her neck.  No weakness or numbness.    PMS, new problem to provider Patient with several year history of mood swings related to her menstrual cycle.  Symptoms typically resolve once her cycle starts.  She is interested in starting medication.  She has tried antidepressants in the past but did not like the side effects.   ROS: Per HPI  PMH: She reports that she has quit smoking. She has never used smokeless tobacco. She reports that she does not drink alcohol or use drugs.  Objective:  Physical Exam: BP 122/70 (BP Location: Left Arm, Patient Position: Sitting, Cuff Size: Normal)   Pulse 65   Temp 98 F (36.7 C) (Oral)   Ht 5\' 5"  (1.651 m)   Wt 192 lb 6.4 oz (87.3 kg)   SpO2 99%   BMI 32.02 kg/m   Gen: NAD, resting comfortably CV: RRR with no murmurs appreciated Pulm: NWOB, CTAB with no crackles, wheezes, or rhonchi MSK: -Right shoulder: No deformities.  Tender to palpation along anterior shoulder.  5 out of 5 strength with supraspinatus, internal rotation, and external rotation.  Mild tenderness elicited with resisted supraspinatus testing.  Spurling test negative. Skin: Warm, dry Neuro: Grossly normal, moves all extremities Psych: Normal affect and thought content  Assessment/Plan:  PMS (premenstrual syndrome) Start Lexapro 10 mg daily.  Discussed possible side effects medication.  Anxiety disorder Symptoms are stable.  We will refill her  Klonopin today.  We will also be starting Lexapro for her PMS which should also help with her anxiety symptoms.  Right shoulder pain Likely mild rotator cuff strain.  Start home exercise program.  Also start low-dose prednisone given modest improvement with NSAIDs to this point.  Discussed other conservative measures and activities to avoid.  She will follow-up with me as needed.  Consider PT referral or steroid injection if no improvement.  Algis Greenhouse. [name], MD 12/13/2017 4:59 PM

## 2017-12-13 NOTE — Assessment & Plan Note (Signed)
Symptoms are stable.  We will refill her Klonopin today.  We will also be starting Lexapro for her PMS which should also help with her anxiety symptoms.

## 2017-12-13 NOTE — Patient Instructions (Signed)
It was very nice to see you today!  Please start the prednisone and lexapro.  Work on the exercises.  Let me know if your symptoms have not improved over the next 7-10 days.   Take care, Dr [name]

## 2018-02-02 ENCOUNTER — Encounter: Payer: Self-pay | Admitting: Family Medicine

## 2018-02-02 ENCOUNTER — Ambulatory Visit (INDEPENDENT_AMBULATORY_CARE_PROVIDER_SITE_OTHER): Payer: 59

## 2018-02-02 ENCOUNTER — Other Ambulatory Visit: Payer: Self-pay

## 2018-02-02 DIAGNOSIS — M25531 Pain in right wrist: Secondary | ICD-10-CM

## 2018-02-02 DIAGNOSIS — M25511 Pain in right shoulder: Secondary | ICD-10-CM | POA: Diagnosis not present

## 2018-02-03 ENCOUNTER — Other Ambulatory Visit: Payer: Self-pay

## 2018-02-03 DIAGNOSIS — M25531 Pain in right wrist: Secondary | ICD-10-CM

## 2018-02-03 DIAGNOSIS — M25511 Pain in right shoulder: Secondary | ICD-10-CM

## 2018-02-03 NOTE — Progress Notes (Signed)
Please inform patient of the following:  Her xrays were both negative for arthritis or any other problems with her bones. I think her pain is most likely soft tissue related - muscles and tendons. Would recommend that she see Dr Paulla Fore for further management.  Tammy Knight. [name], MD 02/03/2018 8:05 AM

## 2018-03-03 ENCOUNTER — Encounter: Payer: Self-pay | Admitting: Family Medicine

## 2018-03-03 ENCOUNTER — Ambulatory Visit: Payer: Self-pay

## 2018-03-03 ENCOUNTER — Ambulatory Visit (INDEPENDENT_AMBULATORY_CARE_PROVIDER_SITE_OTHER): Payer: 59 | Admitting: Sports Medicine

## 2018-03-03 ENCOUNTER — Encounter: Payer: Self-pay | Admitting: Sports Medicine

## 2018-03-03 VITALS — BP 114/76 | HR 60 | Ht 65.0 in | Wt 193.2 lb

## 2018-03-03 DIAGNOSIS — M25511 Pain in right shoulder: Secondary | ICD-10-CM | POA: Diagnosis not present

## 2018-03-03 DIAGNOSIS — M9901 Segmental and somatic dysfunction of cervical region: Secondary | ICD-10-CM | POA: Diagnosis not present

## 2018-03-03 DIAGNOSIS — M9908 Segmental and somatic dysfunction of rib cage: Secondary | ICD-10-CM | POA: Diagnosis not present

## 2018-03-03 DIAGNOSIS — M9902 Segmental and somatic dysfunction of thoracic region: Secondary | ICD-10-CM | POA: Diagnosis not present

## 2018-03-03 DIAGNOSIS — M25531 Pain in right wrist: Secondary | ICD-10-CM | POA: Diagnosis not present

## 2018-03-03 NOTE — Progress Notes (Signed)
  Juanda Bond. [place], [facility] - 27 y.o. female MRN [id number]  Date of birth: 1990-08-23  Visit Date: 03/03/2018  PCP: Vivi Barrack, MD   Referred by: Vivi Barrack, MD   Scribe(s) for today's visit: Wendy Poet, LAT, ATC  SUBJECTIVE:  Tammy Knight is here for New Patient (Initial Visit) (R shoulder and R wrist pain)  Referred by: Dr. [name]  HPI: Her R shoulder and R wrist symptoms INITIALLY: Began about 8 months ago for her R shoulder and 3 years for her R wrist w/ no known MOI Described as moderate, achy and sharp pain in her R shoulder and mild achy pain in her R wrist, radiating to R neck and R upper arm for the shoulder and into R thumb and hand from her wrist Worsened with laying on her R side and overhead ROM (R shoulder); repetitive movement (wrist) Improved with nothing noted Additional associated symptoms include: some N/T noted in the R hand; mechanical symptoms noted in her R wrist but not shoulder; no swelling noted in R shoulder or R wrist    At this time symptoms are worsening compared to onset  She was prescribed prednisone but is no longer taking it.  She was given a HEP by Dr. [name] but states that the exercises seemed to bother her symptoms more so she stopped doing them.  R wrist and R shoulder XR - 02/02/18  REVIEW OF SYSTEMS: Reports night time disturbances. Denies fevers, chills, or night sweats. Denies unexplained weight loss. Denies personal history of cancer. Denies changes in bowel or bladder habits. Denies recent unreported falls. Denies new or worsening dyspnea or wheezing. Denies headaches or dizziness.  Reports numbness, tingling or weakness  In the extremities.  Denies dizziness or presyncopal episodes Denies lower extremity edema      CMA/ATC served as scribe during this visit. History, Physical, and Plan performed by  medical provider. Documentation and orders reviewed and attested to.      Gerda Diss, [facility] Physician

## 2018-03-03 NOTE — Patient Instructions (Addendum)
You had an injection today.  Things to be aware of after injection are listed below: . You may experience no significant improvement or even a slight worsening in your symptoms during the first 24 to 48 hours.  After that we expect your symptoms to improve gradually over the next 2 weeks for the medicine to have its maximal effect.  You should continue to have improvement out to 6 weeks after your injection. . Dr. Paulla Fore recommends icing the site of the injection for 20 minutes  1-2 times the day of your injection . You may shower but no swimming, tub bath or Jacuzzi for 24 hours. . If your bandage falls off this does not need to be replaced.  It is appropriate to remove the bandage after 4 hours. . You may resume light activities as tolerated unless otherwise directed per Dr. Paulla Fore during your visit  POSSIBLE STEROID SIDE EFFECTS:  Side effects from injectable steroids tend to be less than when taken orally however you may experience some of the symptoms listed below.  If experienced these should only last for a short period of time. Change in menstrual flow  Edema (swelling)  Increased appetite Skin flushing (redness)  Skin rash/acne  Thrush (oral) Yeast vaginitis    Increased sweating  Depression Increased blood glucose levels Cramping and leg/calf  Euphoria (feeling happy)  POSSIBLE PROCEDURE SIDE EFFECTS: The side effects of the injection are usually fairly minimal however if you may experience some of the following side effects that are usually self-limited and will is off on their own.  If you are concerned please feel free to call the office with questions:  Increased numbness or tingling  Nausea or vomiting  Swelling or bruising at the injection site   Please call our office if if you experience any of the following symptoms over the next week as these can be signs of infection:   Fever greater than 100.29F  Significant swelling at the injection site  Significant redness or drainage  from the injection site  If after 2 weeks you are continuing to have worsening symptoms please call our office to discuss what the next appropriate actions should be including the potential for a return office visit or other diagnostic testing.    Please perform the exercise program that we have prepared for you and gone over in detail on a daily basis.  In addition to the handout you were provided you can access your program through: [email or web address]   Your unique program code is:  [email or web address]    Also check out [employer]" which is a program developed by Dr. Minerva Ends.   There are links to a couple of his YouTube Videos below and I would like to see performing one of his videos 5-6 days per week.    A good intro video is: "Independence from Pain 7-minute Video" - [email or web address]   Exercises that focus more on the neck are as below: Dr. Archie Balboa with [employer] teaching neck and shoulder details Part 1 - https://youtu.be/[email or web address] Part 2 Dr. Archie Balboa with [facility] quick routine to practice daily - https://youtu.be/[email or web address]  Do not try to attempt the entire video when first beginning.    Try breaking of each exercise that he goes into shorter segments.  Otherwise if they perform an exercise for 45 seconds, start with 15 seconds and rest and then resume when they begin the new activity.  If you work your way up to being able to do these  videos without having to stop, I expect you will see significant improvements in your pain.  If you enjoy his videos and would like to find out more you can look on his website: [email or web address]  He has a workout streaming option as well as a DVD set available for purchase.  Amazon has the best price for his DVDs.

## 2018-03-03 NOTE — Procedures (Signed)
PROCEDURE NOTE:  Ultrasound Guided: Injection: Right carpal tunnel hydrodissection Images were obtained and interpreted by myself, Teresa Coombs, DO  Images have been saved and stored to PACS system. Images obtained on: GE S7 Ultrasound machine    ULTRASOUND FINDINGS:  Median nerve is 0.12 cm  DESCRIPTION OF PROCEDURE:  The patient's clinical condition is marked by substantial pain and/or significant functional disability. Other conservative therapy has not provided relief, is contraindicated, or not appropriate. There is a reasonable likelihood that injection will significantly improve the patient's pain and/or functional impairment.   After discussing the risks, benefits and expected outcomes of the injection and all questions were reviewed and answered, the patient wished to undergo the above named procedure.  Verbal consent was obtained.  The ultrasound was used to identify the target structure and adjacent neurovascular structures. The skin was then prepped in sterile fashion and the target structure was injected under direct visualization using sterile technique as below:  Single injection performed as below: PREP: Alcohol and Ethel Chloride APPROACH:radial sided, single injection, 25g 1.5 in. INJECTATE: 0.5 cc 1% lidocaine, 0.5 cc 0.5% Marcaine and 0.5 cc 40mg /mL DepoMedrol ASPIRATE: None DRESSING: Band-Aid  Post procedural instructions including recommending icing and warning signs for infection were reviewed.    This procedure was well tolerated and there were no complications.   IMPRESSION: Succesful Ultrasound Guided: Injection

## 2018-03-03 NOTE — Progress Notes (Signed)
PROCEDURE NOTE : OSTEOPATHIC MANIPULATION The decision today to treat with Osteopathic Manipulative Therapy (OMT) was based on physical exam findings. Verbal consent was obtained following a discussion with the patient regarding the of risks, benefits and potential side effects, including an acute pain flare,post manipulation soreness and need for repeat treatments. Additionally, we specifically discussed the minimal risk of  injury to neurovascular structures associated with Cervical manipulation.   Contraindications to OMT: NONE  Manipulation was performed as below: Regions Treated OMT Techniques Used  Cervical spine Thoracic spine Ribs HVLA muscle energy myofascial release   The patient tolerated the treatment well and reported Improved symptoms following treatment today. Patient was given medications, exercises, stretches and lifestyle modifications per AVS and verbally.   OSTEOPATHIC/STRUCTURAL EXAM:   OA - rotated right C3 -4 Neutral, Rotated RIGHT, Sidebent LEFT T4 -8 Neutral, Rotated LEFT, Sidebent RIGHT Rib 6 Right  Posterior

## 2018-03-03 NOTE — Progress Notes (Signed)
HISTORY:  Prior history reviewed and updated per electronic medical record.  Social History   Occupational History  . Not on file  Tobacco Use  . Smoking status: Former Research scientist (life sciences)  . Smokeless tobacco: Never Used  Substance and Sexual Activity  . Alcohol use: No    Comment: Occasional  . Drug use: No  . Sexual activity: Yes    Partners: Male    Birth control/protection: Condom   Social History   Social History Narrative  . Not on file     Past Medical History:  Diagnosis Date  . Asthma   . Chlamydia   . Depression   . GERD (gastroesophageal reflux disease)   . Hay fever   . UTI (urinary tract infection)      Past Surgical History:  Procedure Laterality Date  . ABDOMINOPLASTY  2014  . TONSILLECTOMY      family history includes Alcohol abuse in her father; Arthritis in her mother; COPD in her mother; Depression in her father, mother, and sister; Diabetes in her paternal grandfather; Glaucoma in her mother; Hearing loss in her sister; Heart attack in her father; Hyperlipidemia in her father; Hypertension in her father.  DATA OBTAINED & REVIEWED:  No results for input(s): HGBA1C, LABURIC, CREATINE in the last 8760 hours. No problems updated. . 02/02/2018: X-ray of the wrist and shoulder are normal without evidence of acute findings.  OBJECTIVE:  VS:  HT:5\' 5"  (165.1 cm)   WT:193 lb 3.2 oz (87.6 kg)  BMI:32.15    BP:114/76  HR:60bpm  TEMP: ( )  RESP:98 %   PHYSICAL EXAM: CONSTITUTIONAL: Well-developed, Well-nourished and In no acute distress PSYCHIATRIC : Alert & appropriately interactive. and Not depressed or anxious appearing. RESPIRATORY : No increased work of breathing and Trachea Midline EYES : Pupils are equal., EOM intact without nystagmus. and No scleral icterus.  VASCULAR EXAM : Warm and well perfused NEURO: unremarkable  MSK Exam: Right shoulder  Well aligned, no significant deformity. No overlying skin changes. Pain with carpal tunnel  compression test, positive Phalen's minimally.   RANGE OF MOTION & STRENGTH  Upper extremity strength is otherwise intact in all myotomes.   SPECIALITY TESTING:  Slightly painful Spurling's compression test on the right this is minimal.  Negative arm squeeze test and brachial plexus squeeze.  Limited cervical range of motion per osteopathic exam.  Upper extremity sensation and strength intact slight median nerve dysesthesia on the right.     ASSESSMENT   1. Right wrist pain   2. Right shoulder pain, unspecified chronicity   3. Somatic dysfunction of cervical region   4. Somatic dysfunction of thoracic region   5. Somatic dysfunction of rib cage region     PLAN:  Pertinent additional documentation may be included in corresponding procedure notes, imaging studies, problem based documentation and patient instructions.  Procedures:  . None  Medications:  No orders of the defined types were placed in this encounter.  Discussion/Instructions: No problem-specific Assessment & Plan notes found for this encounter.  Some component of shoulder hand syndrome possibly with likely tight anterior chain contributing to some of her dysfunction.  She does have mild findings of carpal tunnel on MSK ultrasound and diagnostic/therapeutic carpal tunnel injection performed today per Discussed red flag symptoms that warrant earlier emergent evaluation and patient voices understanding. . Activity modifications and the importance of avoiding exacerbating activities (limiting pain to no more than a 4 / 10 during or following activity) recommended and discussed.  Follow-up:  .  Return in about 6 weeks (around 04/14/2018) for repeat clinical exam, repeat diagnostic ultrasound.  . If any lack of improvement: consider further diagnostic evaluation with Nerve conduction studies and/or further advanced structural imaging. . At follow up will plan: to consider repeat osteopathic manipulation     CMA/ATC served  as scribe during this visit. History, Physical, and Plan performed by medical provider. Documentation and orders reviewed and attested to.      Gerda Diss, [facility] Physician

## 2018-03-04 ENCOUNTER — Ambulatory Visit: Payer: 59 | Admitting: Family Medicine

## 2018-03-04 ENCOUNTER — Encounter: Payer: Self-pay | Admitting: Family Medicine

## 2018-03-04 ENCOUNTER — Ambulatory Visit (INDEPENDENT_AMBULATORY_CARE_PROVIDER_SITE_OTHER): Payer: 59 | Admitting: Family Medicine

## 2018-03-04 ENCOUNTER — Telehealth: Payer: Self-pay | Admitting: Family Medicine

## 2018-03-04 VITALS — BP 118/64 | HR 61 | Temp 98.6°F | Ht 65.0 in | Wt 188.8 lb

## 2018-03-04 DIAGNOSIS — Z6831 Body mass index (BMI) 31.0-31.9, adult: Secondary | ICD-10-CM | POA: Diagnosis not present

## 2018-03-04 DIAGNOSIS — F419 Anxiety disorder, unspecified: Secondary | ICD-10-CM | POA: Diagnosis not present

## 2018-03-04 DIAGNOSIS — E669 Obesity, unspecified: Secondary | ICD-10-CM

## 2018-03-04 DIAGNOSIS — N943 Premenstrual tension syndrome: Secondary | ICD-10-CM | POA: Diagnosis not present

## 2018-03-04 MED ORDER — SEMAGLUTIDE(0.25 OR 0.5MG/DOS) 2 MG/1.5ML ~~LOC~~ SOPN: 0.5000 mg | PEN_INJECTOR | SUBCUTANEOUS | 2 refills | Status: DC

## 2018-03-04 MED ORDER — DULAGLUTIDE 1.5 MG/0.5ML ~~LOC~~ SOAJ: SUBCUTANEOUS | 3 refills | Status: DC

## 2018-03-04 MED ORDER — ESCITALOPRAM OXALATE 10 MG PO TABS: 10.0000 mg | ORAL_TABLET | Freq: Every day | ORAL | 3 refills | Status: DC

## 2018-03-04 NOTE — Progress Notes (Signed)
   Subjective:  Tammy Knight is a 27 y.o. female who presents today with a chief complaint of obesity.   HPI:  Obesity, chronic problem, new to provider Patient is struggling with her weight for several years.  Worsened within the last couple of years.  She has previously been on phentermine and Contrave in the past which did not work for her.  She is prone to stress eating.  Tries to exercise routinely.  She has been on Paxil in the past which is also made her gain weight.  No other obvious alleviating or aggravating factors.  Anxiety/PMS She was started on Lexapro about 2 months ago.  She is doing well with this.  ROS: Per HPI  PMH: She reports that she has quit smoking. She has never used smokeless tobacco. She reports that she does not drink alcohol or use drugs.  Objective:  Physical Exam: BP 118/64 (BP Location: Left Arm, Patient Position: Sitting, Cuff Size: Normal)   Pulse 61   Temp 98.6 F (37 C) (Oral)   Ht 5\' 5"  (1.651 m)   Wt 188 lb 12.8 oz (85.6 kg)   LMP 02/28/2018   SpO2 99%   BMI 31.42 kg/m   Wt Readings from Last 3 Encounters:  03/04/18 188 lb 12.8 oz (85.6 kg)  03/03/18 193 lb 3.2 oz (87.6 kg)  12/13/17 192 lb 6.4 oz (87.3 kg)  Gen: NAD, resting comfortably CV: RRR with no murmurs appreciated Pulm: NWOB, CTAB with no crackles, wheezes, or rhonchi GI: Normal bowel sounds present. Soft, Nontender, Nondistended. MSK: No edema, cyanosis, or clubbing noted Skin: Warm, dry Neuro: Grossly normal, moves all extremities Psych: Normal affect and thought content  Assessment/Plan:  PMS (premenstrual syndrome) Stable.  Continue Lexapro.  Class 1 obesity without serious comorbidity with body mass index (BMI) of 31.0 to 31.9 in adult Discussed treatment options with patient today.  We will start Trulicity 8.18 mg once weekly for the next 4 weeks, then increase to 1.5 mg weekly.  Discussed potential side effects of medication.  She will follow-up with me  in 3 months.  Encouraged good lifestyle modifications including healthy, balanced diet and regular exercise.  Recommended against starting stimulant such as phentermine or Vyvanse today.  Anxiety disorder Stable.  Continue Lexapro 10 mg daily and Klonopin 0.5 mg twice daily as needed.   Tammy Knight. Tammy Pain, MD 03/04/2018 4:59 PM

## 2018-03-04 NOTE — Assessment & Plan Note (Signed)
Stable.  Continue Lexapro 10 mg daily and Klonopin 0.5 mg twice daily as needed.

## 2018-03-04 NOTE — Assessment & Plan Note (Signed)
Discussed treatment options with patient today.  We will start Trulicity 2.86 mg once weekly for the next 4 weeks, then increase to 1.5 mg weekly.  Discussed potential side effects of medication.  She will follow-up with me in 3 months.  Encouraged good lifestyle modifications including healthy, balanced diet and regular exercise.  Recommended against starting stimulant such as phentermine or Vyvanse today.

## 2018-03-04 NOTE — Patient Instructions (Addendum)
It was very nice to see you today!  We will start trulicity today.  Please inject 0.75mg  weekly for the first 4 weeks, then increase to 1mg  weekly.  Come back to see me in 3 months, or sooner if needed.   Take care, Dr [name]

## 2018-03-04 NOTE — Assessment & Plan Note (Signed)
Stable.  Continue Lexapro. 

## 2018-03-04 NOTE — Telephone Encounter (Signed)
Copied from [place] [id number]. Topic: Quick Communication - Appointment Cancellation >> Mar 04, 2018  3:16 PM Yvette Rack wrote: Patient called to cancel appointment scheduled for 03-04-18. Patient has not rescheduled their appointment.  Route to department's PEC pool. >> Mar 04, 2018  3:30 PM Leward Quan A wrote: Patient called back to be put back on the schedule. Stated that she got her car back earlier than she expected

## 2018-03-05 ENCOUNTER — Encounter: Payer: Self-pay | Admitting: Sports Medicine

## 2018-03-07 NOTE — Telephone Encounter (Signed)
Noted.  Patient was seen in office on Friday.

## 2018-05-11 ENCOUNTER — Encounter: Payer: Self-pay | Admitting: Family Medicine

## 2018-05-14 ENCOUNTER — Other Ambulatory Visit: Payer: Self-pay

## 2018-05-14 ENCOUNTER — Emergency Department (HOSPITAL_COMMUNITY)
Admission: EM | Admit: 2018-05-14 | Discharge: 2018-05-14 | Disposition: A | Discharge status: Home / Self Care | Payer: 59 | Attending: Emergency Medicine | Admitting: Emergency Medicine

## 2018-05-14 ENCOUNTER — Encounter (HOSPITAL_COMMUNITY): Payer: Self-pay | Admitting: Emergency Medicine

## 2018-05-14 ENCOUNTER — Emergency Department (HOSPITAL_COMMUNITY): Payer: 59

## 2018-05-14 DIAGNOSIS — Z79899 Other long term (current) drug therapy: Secondary | ICD-10-CM | POA: Diagnosis not present

## 2018-05-14 DIAGNOSIS — R1030 Lower abdominal pain, unspecified: Secondary | ICD-10-CM | POA: Insufficient documentation

## 2018-05-14 DIAGNOSIS — R1031 Right lower quadrant pain: Secondary | ICD-10-CM | POA: Diagnosis not present

## 2018-05-14 DIAGNOSIS — R1909 Other intra-abdominal and pelvic swelling, mass and lump: Secondary | ICD-10-CM | POA: Diagnosis not present

## 2018-05-14 DIAGNOSIS — Z87891 Personal history of nicotine dependence: Secondary | ICD-10-CM | POA: Insufficient documentation

## 2018-05-14 DIAGNOSIS — J45909 Unspecified asthma, uncomplicated: Secondary | ICD-10-CM | POA: Insufficient documentation

## 2018-05-14 LAB — COMPREHENSIVE METABOLIC PANEL
ALT: 19 U/L (ref 0–44)
ANION GAP: 6 (ref 5–15)
AST: 21 U/L (ref 15–41)
Albumin: 3.7 g/dL (ref 3.5–5.0)
Alkaline Phosphatase: 34 U/L — ABNORMAL LOW (ref 38–126)
BILIRUBIN TOTAL: 0.8 mg/dL (ref 0.3–1.2)
BUN: 11 mg/dL (ref 6–20)
CO2: 23 mmol/L (ref 22–32)
Calcium: 9 mg/dL (ref 8.9–10.3)
Chloride: 109 mmol/L (ref 98–111)
Creatinine, Ser: 0.5 mg/dL (ref 0.44–1.00)
GFR calc Af Amer: >60 mL/min (ref >60)
GFR calc non Af Amer: >60 mL/min (ref >60)
Glucose, Bld: 91 mg/dL (ref 70–99)
Potassium: 3.6 mmol/L (ref 3.5–5.1)
Sodium: 138 mmol/L (ref 135–145)
TOTAL PROTEIN: 6.3 g/dL — AB (ref 6.5–8.1)

## 2018-05-14 LAB — CBC WITH DIFFERENTIAL/PLATELET
Abs Immature Granulocytes: 0 10*3/uL (ref 0.00–0.07)
Basophils Absolute: 0 10*3/uL (ref 0.0–0.1)
Basophils Relative: 1 %
EOS ABS: 0.2 10*3/uL (ref 0.0–0.5)
EOS PCT: 3 %
HCT: 36.1 % (ref 36.0–46.0)
Hemoglobin: 12 g/dL (ref 12.0–15.0)
Immature Granulocytes: 0 %
Lymphocytes Relative: 26 %
Lymphs Abs: 1.5 10*3/uL (ref 0.7–4.0)
MCH: 32.1 pg (ref 26.0–34.0)
MCHC: 33.2 g/dL (ref 30.0–36.0)
MCV: 96.5 fL (ref 80.0–100.0)
Monocytes Absolute: 0.8 10*3/uL (ref 0.1–1.0)
Monocytes Relative: 14 %
NRBC: 0 % (ref 0.0–0.2)
Neutro Abs: 3.2 10*3/uL (ref 1.7–7.7)
Neutrophils Relative %: 56 %
Platelets: 250 10*3/uL (ref 150–400)
RBC: 3.74 MIL/uL — ABNORMAL LOW (ref 3.87–5.11)
RDW: 12.2 % (ref 11.5–15.5)
WBC: 5.6 10*3/uL (ref 4.0–10.5)

## 2018-05-14 LAB — URINALYSIS, ROUTINE W REFLEX MICROSCOPIC
Bilirubin Urine: NEGATIVE
Glucose, UA: NEGATIVE mg/dL
Hgb urine dipstick: NEGATIVE
Ketones, ur: NEGATIVE mg/dL
Leukocytes, UA: NEGATIVE
Nitrite: NEGATIVE
PROTEIN: NEGATIVE mg/dL
Specific Gravity, Urine: 1.005 (ref 1.005–1.030)
pH: 7 (ref 5.0–8.0)

## 2018-05-14 LAB — LIPASE, BLOOD: Lipase: 25 U/L (ref 11–51)

## 2018-05-14 LAB — PREGNANCY, URINE: Preg Test, Ur: NEGATIVE

## 2018-05-14 MED ORDER — HYOSCYAMINE SULFATE 0.125 MG PO TABS
0.2500 mg | ORAL_TABLET | Freq: Once | ORAL | Status: DC
  Filled 2018-05-14: qty 2

## 2018-05-14 MED ORDER — IOPAMIDOL (ISOVUE-300) INJECTION 61%
100.0000 mL | Freq: Once | INTRAVENOUS | Status: AC | PRN
  Administered 2018-05-14: 100 mL via INTRAVENOUS

## 2018-05-14 MED ORDER — MORPHINE SULFATE (PF) 4 MG/ML IV SOLN
4.0000 mg | Freq: Once | INTRAVENOUS | Status: AC
  Administered 2018-05-14: 4 mg via INTRAVENOUS
  Filled 2018-05-14: qty 1

## 2018-05-14 MED ORDER — ONDANSETRON HCL 4 MG/2ML IJ SOLN
4.0000 mg | Freq: Once | INTRAMUSCULAR | Status: AC
  Administered 2018-05-14: 4 mg via INTRAVENOUS
  Filled 2018-05-14: qty 2

## 2018-05-14 MED ORDER — SODIUM CHLORIDE 0.9 % IV BOLUS
1000.0000 mL | Freq: Once | INTRAVENOUS | Status: AC
  Administered 2018-05-14: 1000 mL via INTRAVENOUS

## 2018-05-14 NOTE — ED Triage Notes (Signed)
Pt c/o abdominal pain since last night. Pt states she has problems with constipation and took a colon cleanser Friday morning and has been having pain since.

## 2018-05-14 NOTE — Discharge Instructions (Addendum)
[facility] in 2 days to schedule the next available appointment.  The CT scan of your abdomen shows that you have an abnormality near your right ovary.  You need to be checked for cancer

## 2018-05-14 NOTE — ED Provider Notes (Signed)
8:05 AM patient is asymptomatic presently.  She complains of lower abdominal pain which started last night that she reports that she treated self with an enema with some relief.  However she did have crampy lower abdominal pain throughout the night.  Last normal menstrual period April 20, 2018.  No urinary symptoms.  Admits to slight amount of vaginal discharge which is normal for her.  No other associated symptoms.  Exam alert nontoxic abdomen nondistended normal active bowel sounds nontender  10:40 AM patient remains asymptomatic.  Results for orders placed or performed during the hospital encounter of 05/14/18  CBC with Differential/Platelet  Result Value Ref Range   WBC 5.6 4.0 - 10.5 K/uL   RBC 3.74 (L) 3.87 - 5.11 MIL/uL   Hemoglobin 12.0 12.0 - 15.0 g/dL   HCT 36.1 36.0 - 46.0 %   MCV 96.5 80.0 - 100.0 fL   MCH 32.1 26.0 - 34.0 pg   MCHC 33.2 30.0 - 36.0 g/dL   RDW 12.2 11.5 - 15.5 %   Platelets 250 150 - 400 K/uL   nRBC 0.0 0.0 - 0.2 %   Neutrophils Relative % 56 %   Neutro Abs 3.2 1.7 - 7.7 K/uL   Lymphocytes Relative 26 %   Lymphs Abs 1.5 0.7 - 4.0 K/uL   Monocytes Relative 14 %   Monocytes Absolute 0.8 0.1 - 1.0 K/uL   Eosinophils Relative 3 %   Eosinophils Absolute 0.2 0.0 - 0.5 K/uL   Basophils Relative 1 %   Basophils Absolute 0.0 0.0 - 0.1 K/uL   Immature Granulocytes 0 %   Abs Immature Granulocytes 0.00 0.00 - 0.07 K/uL  Comprehensive metabolic panel  Result Value Ref Range   Sodium 138 135 - 145 mmol/L   Potassium 3.6 3.5 - 5.1 mmol/L   Chloride 109 98 - 111 mmol/L   CO2 23 22 - 32 mmol/L   Glucose, Bld 91 70 - 99 mg/dL   BUN 11 6 - 20 mg/dL   Creatinine, Ser 0.50 0.44 - 1.00 mg/dL   Calcium 9.0 8.9 - 10.3 mg/dL   Total Protein 6.3 (L) 6.5 - 8.1 g/dL   Albumin 3.7 3.5 - 5.0 g/dL   AST 21 15 - 41 U/L   ALT 19 0 - 44 U/L   Alkaline Phosphatase 34 (L) 38 - 126 U/L   Total Bilirubin 0.8 0.3 - 1.2 mg/dL   GFR calc non Af Amer >60 >60 mL/min   GFR calc Af  Amer >60 >60 mL/min   Anion gap 6 5 - 15  Urinalysis, Routine w reflex microscopic  Result Value Ref Range   Color, Urine STRAW (A) YELLOW   APPearance CLEAR CLEAR   Specific Gravity, Urine 1.005 1.005 - 1.030   pH 7.0 5.0 - 8.0   Glucose, UA NEGATIVE NEGATIVE mg/dL   Hgb urine dipstick NEGATIVE NEGATIVE   Bilirubin Urine NEGATIVE NEGATIVE   Ketones, ur NEGATIVE NEGATIVE mg/dL   Protein, ur NEGATIVE NEGATIVE mg/dL   Nitrite NEGATIVE NEGATIVE   Leukocytes, UA NEGATIVE NEGATIVE  Pregnancy, urine  Result Value Ref Range   Preg Test, Ur NEGATIVE NEGATIVE  Lipase, blood  Result Value Ref Range   Lipase 25 11 - 51 U/L   Ct Abdomen Pelvis W Contrast  Result Date: 05/14/2018 CLINICAL DATA:  Abdominal pain since last night. Constipation. EXAM: CT ABDOMEN AND PELVIS WITH CONTRAST TECHNIQUE: Multidetector CT imaging of the abdomen and pelvis was performed using the standard protocol following bolus administration  of intravenous contrast. CONTRAST:  181mL ISOVUE-300 IOPAMIDOL (ISOVUE-300) INJECTION 61% COMPARISON:  None. FINDINGS: Lower chest: No acute abnormality. Hepatobiliary: No focal liver abnormality is seen. No gallstones, gallbladder wall thickening, or biliary dilatation. Pancreas: Unremarkable. No pancreatic ductal dilatation or surrounding inflammatory changes. Spleen: Normal in size without focal abnormality. Adrenals/Urinary Tract: Adrenal glands appear normal. Kidneys appear normal without mass, stone or hydronephrosis. No perinephric fluid. No ureteral or bladder calculi identified. Bladder appears normal. Stomach/Bowel: No dilated large or small bowel loops. Moderate amount of stool throughout the nondistended colon. No evidence of bowel wall inflammation. Appendix is normal. Vascular/Lymphatic: No significant vascular findings are present. No enlarged abdominal or pelvic lymph nodes. Reproductive: Mixed density mass within the RIGHT adnexal region, with associated calcification, most  likely ovarian dermoid, measuring 11.5 x 8.3 x 8.5 cm LEFT adnexal region is unremarkable. Other: Trace free fluid in the lower RIGHT pericolic gutter, versus extension component of the presumed dermoid. Musculoskeletal: No acute or suspicious osseous finding. IMPRESSION: 1. Mixed density mass within the RIGHT adnexal region, with associated calcification, measuring 11.5 x 8.3 x 8.5 cm, most likely ovarian dermoid, other neoplastic process considered much less likely. Trace free fluid in the lower RIGHT paracolic gutter, versus extension component of the presumed dermoid. Recommend GYN consult for further workup considerations and/or possible surgical removal. If any localizable RIGHT lower quadrant pain, would consider pelvic ultrasound to exclude associated ovarian torsion. 2. Remainder of the abdomen and pelvis CT is unremarkable, as detailed above. No bowel obstruction or evidence of bowel wall inflammation seen. Moderate amount of stool throughout the nondistended colon. Appendix is normal. Electronically Signed   By: Franki Cabot M.D.   On: 05/14/2018 09:56   I discussed case with Dr. Roselie Awkward, gynecologist.  [facility]  Strongly doubt ovarian torsion.  Patient had bilateral lower abdominal pain and was asymptomatic on both occasions that I interviewed her.  Diagnosis #1 lower abdominal pain #2 right adnexal mass   Orlie Dakin, MD 05/14/18 1049

## 2018-05-14 NOTE — ED Provider Notes (Signed)
[facility] EMERGENCY DEPARTMENT Provider Note   CSN: [id number] Arrival date & time: 05/14/18  0631     History   Chief Complaint Chief Complaint  Patient presents with  . Abdominal Pain    HPI Tammy Knight is a 28 y.o. female.  Patient presents to the emergency department for evaluation of abdominal pain.  Patient reports that she has been experiencing severe low abdominal pain and cramping.  She started feeling bloated several days ago.  She has a history of constipation, has taken some laxatives and had a small bowel movement yesterday.  She tried an enema as well without results.  Overnight she started having severe cramping.  No nausea, vomiting.  No fever.  No vaginal discharge, bleeding, history of ovarian cysts.     Past Medical History:  Diagnosis Date  . Asthma   . Chlamydia   . Depression   . GERD (gastroesophageal reflux disease)   . Hay fever   . UTI (urinary tract infection)     Patient Active Problem List   Diagnosis Date Noted  . Class 1 obesity without serious comorbidity with body mass index (BMI) of 31.0 to 31.9 in adult 03/04/2018  . PMS (premenstrual syndrome) 12/13/2017  . Anxiety disorder 08/13/2017  . Constipation 08/13/2017  . Right wrist pain 07/15/2017  . Right leg pain 07/15/2017    Past Surgical History:  Procedure Laterality Date  . ABDOMINOPLASTY  2014  . TONSILLECTOMY       OB History    Gravida  0   Para  0   Term  0   Preterm  0   AB  0   Living  0     SAB  0   TAB  0   Ectopic  0   Multiple  0   Live Births               Home Medications    Prior to Admission medications   Medication Sig Start Date End Date Taking? Authorizing Provider  clonazePAM (KLONOPIN) 0.5 MG tablet Take 1 tablet (0.5 mg total) by mouth 2 (two) times daily as needed for anxiety. 12/13/17   Vivi Barrack, MD  Dulaglutide (TRULICITY) 1.5 OA/4.1YS SOPN Inject 1.5mg  weekly. 03/04/18   Vivi Barrack, MD    escitalopram (LEXAPRO) 10 MG tablet Take 1 tablet (10 mg total) by mouth daily. 03/04/18   Vivi Barrack, MD    Family History Family History  Problem Relation Age of Onset  . Heart attack Father        2014  . Alcohol abuse Father   . Depression Father   . Hyperlipidemia Father   . Hypertension Father   . COPD Mother   . Glaucoma Mother   . Arthritis Mother   . Depression Mother   . Depression Sister   . Hearing loss Sister   . Diabetes Paternal Grandfather     Social History Social History   Tobacco Use  . Smoking status: Former Research scientist (life sciences)  . Smokeless tobacco: Never Used  Substance Use Topics  . Alcohol use: Yes    Alcohol/week: 5.0 standard drinks    Types: 5 Glasses of wine per week    Comment: Occasional  . Drug use: No     Allergies   Penicillins and Bee venom   Review of Systems Review of Systems  Gastrointestinal: Positive for abdominal pain.  All other systems reviewed and are negative.    Physical Exam Updated  Vital Signs BP 109/64 (BP Location: Right Arm)   Pulse 75   Temp 98.4 F (36.9 C) (Oral)   Resp 19   Ht 5\' 5"  (1.651 m)   Wt 84.8 kg   LMP 04/21/2018   SpO2 98%   BMI 31.12 kg/m   Physical Exam Vitals signs and nursing note reviewed.  Constitutional:      General: She is not in acute distress.    Appearance: Normal appearance. She is well-developed.  HENT:     Head: Normocephalic and atraumatic.     Right Ear: Hearing normal.     Left Ear: Hearing normal.     Nose: Nose normal.  Eyes:     Conjunctiva/sclera: Conjunctivae normal.     Pupils: Pupils are equal, round, and reactive to light.  Neck:     Musculoskeletal: Normal range of motion and neck supple.  Cardiovascular:     Rate and Rhythm: Regular rhythm.     Heart sounds: S1 normal and S2 normal. No murmur. No friction rub. No gallop.   Pulmonary:     Effort: Pulmonary effort is normal. No respiratory distress.     Breath sounds: Normal breath sounds.  Chest:      Chest wall: No tenderness.  Abdominal:     General: Bowel sounds are normal.     Palpations: Abdomen is soft.     Tenderness: There is abdominal tenderness in the right lower quadrant, suprapubic area and left lower quadrant. There is no guarding or rebound. Negative signs include Murphy's sign and McBurney's sign.     Hernia: No hernia is present.  Musculoskeletal: Normal range of motion.  Skin:    General: Skin is warm and dry.     Findings: No rash.  Neurological:     Mental Status: She is alert and oriented to person, place, and time.     GCS: GCS eye subscore is 4. GCS verbal subscore is 5. GCS motor subscore is 6.     Cranial Nerves: No cranial nerve deficit.     Sensory: No sensory deficit.     Coordination: Coordination normal.  Psychiatric:        Speech: Speech normal.        Behavior: Behavior normal.        Thought Content: Thought content normal.      ED Treatments / Results  Labs (all labs ordered are listed, but only abnormal results are displayed) Labs Reviewed  CBC WITH DIFFERENTIAL/PLATELET  COMPREHENSIVE METABOLIC PANEL  URINALYSIS, ROUTINE W REFLEX MICROSCOPIC  PREGNANCY, URINE  LIPASE, BLOOD    EKG None  Radiology No results found.  Procedures Procedures (including critical care time)  Medications Ordered in ED Medications  morphine 4 MG/ML injection 4 mg (has no administration in time range)  ondansetron (ZOFRAN) injection 4 mg (has no administration in time range)  sodium chloride 0.9 % bolus 1,000 mL (has no administration in time range)  hyoscyamine (LEVSIN, ANASPAZ) tablet 0.25 mg (has no administration in time range)     Initial Impression / Assessment and Plan / ED Course  I have reviewed the triage vital signs and the nursing notes.  Pertinent labs & imaging results that were available during my care of the patient were reviewed by me and considered in my medical decision making (see chart for details).     Patient presents with  recurrent lower abdominal pain.  She was seen in the ER previously for this and told she had constipation.  Her primary care doctor put her on MiraLAX and ultimately she improved.  She has had recurrence of symptoms now, not relieved by an over-the-counter "colon cleanse" and enema.  Will have lab work performed and CAT scan to further evaluate.  Likely will require gastroenterology follow-up.  She does not have any specific pelvic complaints or gynecologic symptoms at this time.  Will sign out to oncoming ER physician to follow-up results.  Final Clinical Impressions(s) / ED Diagnoses   Final diagnoses:  Lower abdominal pain    ED Discharge Orders    None       Caydee Talkington, Gwenyth Allegra, MD 05/14/18 0710

## 2018-05-16 ENCOUNTER — Other Ambulatory Visit: Payer: Self-pay | Admitting: Family Medicine

## 2018-05-16 ENCOUNTER — Telehealth: Payer: Self-pay | Admitting: *Deleted

## 2018-05-16 ENCOUNTER — Encounter: Payer: Self-pay | Admitting: Family Medicine

## 2018-05-16 DIAGNOSIS — N83209 Unspecified ovarian cyst, unspecified side: Secondary | ICD-10-CM

## 2018-05-16 NOTE — Telephone Encounter (Signed)
This is not an acute ovarian mass, it is a dermoid   It will require surgical removal, so 05/26/2018 is fine  It is not urgent

## 2018-05-16 NOTE — Telephone Encounter (Signed)
Patient informed Dr Elonda Husky reviewed chart and this is not an acute ovarian mass, it is a dermoid.  It will require surgical removal but is not urgent. Patient verbalized understanding. Appt scheduled.

## 2018-05-17 ENCOUNTER — Encounter: Payer: Self-pay | Admitting: Family Medicine

## 2018-05-20 ENCOUNTER — Ambulatory Visit (INDEPENDENT_AMBULATORY_CARE_PROVIDER_SITE_OTHER): Payer: 59 | Admitting: Obstetrics and Gynecology

## 2018-05-20 ENCOUNTER — Encounter: Payer: Self-pay | Admitting: Obstetrics and Gynecology

## 2018-05-20 ENCOUNTER — Telehealth: Payer: Self-pay | Admitting: Surgical

## 2018-05-20 VITALS — BP 117/74 | HR 65 | Ht 65.0 in | Wt 196.8 lb

## 2018-05-20 DIAGNOSIS — R102 Pelvic and perineal pain: Secondary | ICD-10-CM | POA: Diagnosis not present

## 2018-05-20 DIAGNOSIS — D270 Benign neoplasm of right ovary: Secondary | ICD-10-CM | POA: Diagnosis not present

## 2018-05-20 NOTE — Telephone Encounter (Signed)
After speaking with Dr. Amalia Hailey I notified the patient that he would not be able to write her out of work at this time. He did say that after the surgery he would be able to give her a note to be out. Patient verbalized understanding.

## 2018-05-20 NOTE — Progress Notes (Signed)
Patient comes in today as new GYN patient.. She was referred by PCP for ovarian cyst. Patient states that she is not having any pain at the present time.

## 2018-05-20 NOTE — Progress Notes (Signed)
HPI:      [name] is a 28 y.o. G0P0000 who LMP was Patient's last menstrual period was 04/21/2018.  Subjective:   She presents today with a history of pelvic pain which is intermittent.  She describes it as severe when it occurs.  It seems to occur infrequently but is somewhat disabling when it does occur. She reports normal regular menstrual cycles. An abdominal CT scan reveals a right ovarian teratoma with associated calcium deposits.    Hx: The following portions of the patient's history were reviewed and updated as appropriate:             She  has a past medical history of Asthma, Chlamydia, Depression, GERD (gastroesophageal reflux disease), Hay fever, and UTI (urinary tract infection). She does not have any pertinent problems on file. She  has a past surgical history that includes Tonsillectomy and Abdominoplasty (2014). Her family history includes Alcohol abuse in her father; Arthritis in her mother; COPD in her mother; Depression in her father, mother, and sister; Diabetes in her paternal grandfather; Glaucoma in her mother; Hearing loss in her sister; Heart attack in her father; Hyperlipidemia in her father; Hypertension in her father. She  reports that she has quit smoking. She has never used smokeless tobacco. She reports current alcohol use of about 5.0 standard drinks of alcohol per week. She reports that she does not use drugs. She has a current medication list which includes the following prescription(s): clonazepam, escitalopram, dulaglutide, and colon cleanse. She is allergic to penicillins and bee venom.       Review of Systems:  Review of Systems  Constitutional: Denied constitutional symptoms, night sweats, recent illness, fatigue, fever, insomnia and weight loss.  Eyes: Denied eye symptoms, eye pain, photophobia, vision change and visual disturbance.  Ears/Nose/Throat/Neck: Denied ear, nose, throat or neck symptoms, hearing loss, nasal discharge,  sinus congestion and sore throat.  Cardiovascular: Denied cardiovascular symptoms, arrhythmia, chest pain/pressure, edema, exercise intolerance, orthopnea and palpitations.  Respiratory: Denied pulmonary symptoms, asthma, pleuritic pain, productive sputum, cough, dyspnea and wheezing.  Gastrointestinal: Denied, gastro-esophageal reflux, melena, nausea and vomiting.  Genitourinary: See HPI for additional information.  Musculoskeletal: Denied musculoskeletal symptoms, stiffness, swelling, muscle weakness and myalgia.  Dermatologic: Denied dermatology symptoms, rash and scar.  Neurologic: Denied neurology symptoms, dizziness, headache, neck pain and syncope.  Psychiatric: Denied psychiatric symptoms, anxiety and depression.  Endocrine: Denied endocrine symptoms including hot flashes and night sweats.   Meds:   Current Outpatient Medications on File Prior to Visit  Medication Sig Dispense Refill  . clonazePAM (KLONOPIN) 0.5 MG tablet Take 1 tablet (0.5 mg total) by mouth 2 (two) times daily as needed for anxiety. 20 tablet 1  . escitalopram (LEXAPRO) 10 MG tablet Take 1 tablet (10 mg total) by mouth daily. 90 tablet 3  . Dulaglutide (TRULICITY) 1.5 PP/5.0DT SOPN Inject 1.5mg  weekly. (Patient not taking: Reported on 05/20/2018) 4 pen 3  . Misc Natural Products (COLON CLEANSE) CAPS Take 2 capsules by mouth daily.     No current facility-administered medications on file prior to visit.     Objective:     Vitals:   05/20/18 1103  BP: 117/74  Pulse: 65              CT scan reviewed in detail with the patient.  Assessment:    G0P0000 Patient Active Problem List   Diagnosis Date Noted  . Class 1 obesity without serious comorbidity with body mass index (BMI) of 31.0  to 31.9 in adult 03/04/2018  . PMS (premenstrual syndrome) 12/13/2017  . Anxiety disorder 08/13/2017  . Constipation 08/13/2017  . Right wrist pain 07/15/2017  . Right leg pain 07/15/2017     1. Teratoma of right ovary    2. Pelvic pain in female     Patient desires surgical removal.   Plan:            1.  Discussed ovarian teratoma in detail.  Surgery for removal discussed.  Questions answered.  We will schedule preop appointment.  Patient is indicating she desires surgery on February 24 if possible. Orders No orders of the defined types were placed in this encounter.   No orders of the defined types were placed in this encounter.     F/U  Return in about 1 week (around 05/27/2018). I spent 32 minutes involved in the care of this patient of which greater than 50% was spent discussing ovarian teratoma versus other diagnoses, associated pelvic pain, previous surgery, definitive management, bilaterality of teratomas, type of surgery, time off work etc.  All questions answered.  Finis Bud, M.D. 05/20/2018 12:35 PM

## 2018-05-25 ENCOUNTER — Telehealth: Payer: Self-pay | Admitting: Obstetrics and Gynecology

## 2018-05-25 NOTE — Telephone Encounter (Signed)
The patient called and stated that she would like to speak with her nurse or provider in regards to her FMLA paperwork. Please advise.

## 2018-05-27 ENCOUNTER — Ambulatory Visit (INDEPENDENT_AMBULATORY_CARE_PROVIDER_SITE_OTHER): Payer: 59 | Admitting: Obstetrics and Gynecology

## 2018-05-27 ENCOUNTER — Telehealth: Payer: Self-pay | Admitting: Family Medicine

## 2018-05-27 ENCOUNTER — Encounter: Payer: Self-pay | Admitting: Obstetrics and Gynecology

## 2018-05-27 VITALS — BP 108/65 | HR 66 | Ht 65.0 in | Wt 192.6 lb

## 2018-05-27 DIAGNOSIS — D270 Benign neoplasm of right ovary: Secondary | ICD-10-CM | POA: Diagnosis not present

## 2018-05-27 DIAGNOSIS — R102 Pelvic and perineal pain: Secondary | ICD-10-CM | POA: Diagnosis not present

## 2018-05-27 NOTE — H&P (Signed)
PRE-OPERATIVE HISTORY AND PHYSICAL EXAM  PCP:  Vivi Barrack, MD Subjective:   HPI:  Tammy Knight is a 28 y.o. G0P0000.  Patient's last menstrual period was 05/20/2018.  She presents today for a pre-op discussion and PE.  She has the following symptoms: She has intermittent pelvic pain was found by CT to have a right ovarian teratoma.  8 x 11 cm  Review of Systems:   Constitutional: Denied constitutional symptoms, night sweats, recent illness, fatigue, fever, insomnia and weight loss.  Eyes: Denied eye symptoms, eye pain, photophobia, vision change and visual disturbance.  Ears/Nose/Throat/Neck: Denied ear, nose, throat or neck symptoms, hearing loss, nasal discharge, sinus congestion and sore throat.  Cardiovascular: Denied cardiovascular symptoms, arrhythmia, chest pain/pressure, edema, exercise intolerance, orthopnea and palpitations.  Respiratory: Denied pulmonary symptoms, asthma, pleuritic pain, productive sputum, cough, dyspnea and wheezing.  Gastrointestinal: Denied, gastro-esophageal reflux, melena, nausea and vomiting.  Genitourinary: Denied genitourinary symptoms including symptomatic vaginal discharge, pelvic relaxation issues, and urinary complaints.  Musculoskeletal: Denied musculoskeletal symptoms, stiffness, swelling, muscle weakness and myalgia.  Dermatologic: Denied dermatology symptoms, rash and scar.  Neurologic: Denied neurology symptoms, dizziness, headache, neck pain and syncope.  Psychiatric: Denied psychiatric symptoms, anxiety and depression.  Endocrine: Denied endocrine symptoms including hot flashes and night sweats.   OB History  Gravida Para Term Preterm AB Living  0 0 0 0 0 0  SAB TAB Ectopic Multiple Live Births  0 0 0 0      Past Medical History:  Diagnosis Date  . Asthma   . Chlamydia   . Depression   . GERD (gastroesophageal reflux disease)   . Hay fever   . UTI (urinary tract infection)     Past Surgical History:    Procedure Laterality Date  . ABDOMINOPLASTY  2014  . TONSILLECTOMY        SOCIAL HISTORY: Social History   Tobacco Use  Smoking Status Former Smoker  Smokeless Tobacco Never Used   Social History   Substance and Sexual Activity  Alcohol Use Yes  . Alcohol/week: 5.0 standard drinks  . Types: 5 Glasses of wine per week   Comment: Occasional   Social History   Substance and Sexual Activity  Drug Use No    Family History  Problem Relation Age of Onset  . Heart attack Father        2014  . Alcohol abuse Father   . Depression Father   . Hyperlipidemia Father   . Hypertension Father   . COPD Mother   . Glaucoma Mother   . Arthritis Mother   . Depression Mother   . Depression Sister   . Hearing loss Sister   . Diabetes Paternal Grandfather     ALLERGIES:  Penicillins and Bee venom  MEDS:   Current Outpatient Medications on File Prior to Visit  Medication Sig Dispense Refill  . clonazePAM (KLONOPIN) 0.5 MG tablet Take 1 tablet (0.5 mg total) by mouth 2 (two) times daily as needed for anxiety. 20 tablet 1  . escitalopram (LEXAPRO) 10 MG tablet Take 1 tablet (10 mg total) by mouth daily. (Patient taking differently: Take 20 mg by mouth daily. ) 90 tablet 3  . Dulaglutide (TRULICITY) 1.5 VP/7.1GG SOPN Inject 1.5mg  weekly. (Patient not taking: Reported on 05/20/2018) 4 pen 3  . Misc Natural Products (COLON CLEANSE) CAPS Take 2 capsules by mouth daily.     No current facility-administered medications on file prior to visit.  No orders of the defined types were placed in this encounter.    Physical examination BP 108/65   Pulse 66   Ht 5\' 5"  (1.651 m)   Wt 192 lb 9.6 oz (87.4 kg)   LMP 05/20/2018   BMI 32.05 kg/m   General NAD, Conversant  HEENT Atraumatic; Op clear with mmm.  Normo-cephalic. Pupils reactive. Anicteric sclerae  Thyroid/Neck Smooth without nodularity or enlargement. Normal ROM.  Neck Supple.  Skin No rashes, lesions or ulceration. Normal  palpated skin turgor. No nodularity.  Breasts: No masses or discharge.  Symmetric.  No axillary adenopathy.  Lungs: Clear to auscultation.No rales or wheezes. Normal Respiratory effort, no retractions.  Heart: NSR.  No murmurs or rubs appreciated. No periferal edema  Abdomen: Soft.  Non-tender.  No masses.  No HSM. No hernia  Extremities: Moves all appropriately.  Normal ROM for age. No lymphadenopathy.  Neuro: Oriented to PPT.  Normal mood. Normal affect.     Pelvic:   Vulva: Normal appearance.  No lesions.  Vagina: No lesions or abnormalities noted.  Support: Normal pelvic support.  Urethra No masses tenderness or scarring.  Meatus Normal size without lesions or prolapse.  Cervix: Normal ectropion.  No lesions.  Anus: Normal exam.  No lesions.  Perineum: Normal exam.  No lesions.        Bimanual   Uterus: Normal size.  Non-tender.  Mobile.  AV.  Adnexae:  Right adnexal mass palpated at pelvic exam  Cul-de-sac: Negative for abnormality.   Assessment:   G0P0000 Patient Active Problem List   Diagnosis Date Noted  . Class 1 obesity without serious comorbidity with body mass index (BMI) of 31.0 to 31.9 in adult 03/04/2018  . PMS (premenstrual syndrome) 12/13/2017  . Anxiety disorder 08/13/2017  . Constipation 08/13/2017  . Right wrist pain 07/15/2017  . Right leg pain 07/15/2017    1. Teratoma of right ovary   2. Pelvic pain in female      Plan:   Orders: No orders of the defined types were placed in this encounter.    1.  Exploratory laparoscopy right oophorectomy

## 2018-05-27 NOTE — H&P (View-Only) (Signed)
PRE-OPERATIVE HISTORY AND PHYSICAL EXAM  PCP:  Vivi Barrack, MD Subjective:   HPI:  Tammy Knight is a 28 y.o. G0P0000.  Patient's last menstrual period was 05/20/2018.  She presents today for a pre-op discussion and PE.  She has the following symptoms: She has intermittent pelvic pain was found by CT to have a right ovarian teratoma.  8 x 11 cm  Review of Systems:   Constitutional: Denied constitutional symptoms, night sweats, recent illness, fatigue, fever, insomnia and weight loss.  Eyes: Denied eye symptoms, eye pain, photophobia, vision change and visual disturbance.  Ears/Nose/Throat/Neck: Denied ear, nose, throat or neck symptoms, hearing loss, nasal discharge, sinus congestion and sore throat.  Cardiovascular: Denied cardiovascular symptoms, arrhythmia, chest pain/pressure, edema, exercise intolerance, orthopnea and palpitations.  Respiratory: Denied pulmonary symptoms, asthma, pleuritic pain, productive sputum, cough, dyspnea and wheezing.  Gastrointestinal: Denied, gastro-esophageal reflux, melena, nausea and vomiting.  Genitourinary: Denied genitourinary symptoms including symptomatic vaginal discharge, pelvic relaxation issues, and urinary complaints.  Musculoskeletal: Denied musculoskeletal symptoms, stiffness, swelling, muscle weakness and myalgia.  Dermatologic: Denied dermatology symptoms, rash and scar.  Neurologic: Denied neurology symptoms, dizziness, headache, neck pain and syncope.  Psychiatric: Denied psychiatric symptoms, anxiety and depression.  Endocrine: Denied endocrine symptoms including hot flashes and night sweats.   OB History  Gravida Para Term Preterm AB Living  0 0 0 0 0 0  SAB TAB Ectopic Multiple Live Births  0 0 0 0      Past Medical History:  Diagnosis Date  . Asthma   . Chlamydia   . Depression   . GERD (gastroesophageal reflux disease)   . Hay fever   . UTI (urinary tract infection)     Past Surgical History:    Procedure Laterality Date  . ABDOMINOPLASTY  2014  . TONSILLECTOMY        SOCIAL HISTORY: Social History   Tobacco Use  Smoking Status Former Smoker  Smokeless Tobacco Never Used   Social History   Substance and Sexual Activity  Alcohol Use Yes  . Alcohol/week: 5.0 standard drinks  . Types: 5 Glasses of wine per week   Comment: Occasional   Social History   Substance and Sexual Activity  Drug Use No    Family History  Problem Relation Age of Onset  . Heart attack Father        2014  . Alcohol abuse Father   . Depression Father   . Hyperlipidemia Father   . Hypertension Father   . COPD Mother   . Glaucoma Mother   . Arthritis Mother   . Depression Mother   . Depression Sister   . Hearing loss Sister   . Diabetes Paternal Grandfather     ALLERGIES:  Penicillins and Bee venom  MEDS:   Current Outpatient Medications on File Prior to Visit  Medication Sig Dispense Refill  . clonazePAM (KLONOPIN) 0.5 MG tablet Take 1 tablet (0.5 mg total) by mouth 2 (two) times daily as needed for anxiety. 20 tablet 1  . escitalopram (LEXAPRO) 10 MG tablet Take 1 tablet (10 mg total) by mouth daily. (Patient taking differently: Take 20 mg by mouth daily. ) 90 tablet 3  . Dulaglutide (TRULICITY) 1.5 ES/9.2ZR SOPN Inject 1.5mg  weekly. (Patient not taking: Reported on 05/20/2018) 4 pen 3  . Misc Natural Products (COLON CLEANSE) CAPS Take 2 capsules by mouth daily.     No current facility-administered medications on file prior to visit.  No orders of the defined types were placed in this encounter.    Physical examination BP 108/65   Pulse 66   Ht 5\' 5"  (1.651 m)   Wt 192 lb 9.6 oz (87.4 kg)   LMP 05/20/2018   BMI 32.05 kg/m   General NAD, Conversant  HEENT Atraumatic; Op clear with mmm.  Normo-cephalic. Pupils reactive. Anicteric sclerae  Thyroid/Neck Smooth without nodularity or enlargement. Normal ROM.  Neck Supple.  Skin No rashes, lesions or ulceration. Normal  palpated skin turgor. No nodularity.  Breasts: No masses or discharge.  Symmetric.  No axillary adenopathy.  Lungs: Clear to auscultation.No rales or wheezes. Normal Respiratory effort, no retractions.  Heart: NSR.  No murmurs or rubs appreciated. No periferal edema  Abdomen: Soft.  Non-tender.  No masses.  No HSM. No hernia  Extremities: Moves all appropriately.  Normal ROM for age. No lymphadenopathy.  Neuro: Oriented to PPT.  Normal mood. Normal affect.     Pelvic:   Vulva: Normal appearance.  No lesions.  Vagina: No lesions or abnormalities noted.  Support: Normal pelvic support.  Urethra No masses tenderness or scarring.  Meatus Normal size without lesions or prolapse.  Cervix: Normal ectropion.  No lesions.  Anus: Normal exam.  No lesions.  Perineum: Normal exam.  No lesions.        Bimanual   Uterus: Normal size.  Non-tender.  Mobile.  AV.  Adnexae:  Right adnexal mass palpated at pelvic exam  Cul-de-sac: Negative for abnormality.   Assessment:   G0P0000 Patient Active Problem List   Diagnosis Date Noted  . Class 1 obesity without serious comorbidity with body mass index (BMI) of 31.0 to 31.9 in adult 03/04/2018  . PMS (premenstrual syndrome) 12/13/2017  . Anxiety disorder 08/13/2017  . Constipation 08/13/2017  . Right wrist pain 07/15/2017  . Right leg pain 07/15/2017    1. Teratoma of right ovary   2. Pelvic pain in female      Plan:   Orders: No orders of the defined types were placed in this encounter.    1.  Exploratory laparoscopy right oophorectomy

## 2018-05-27 NOTE — Telephone Encounter (Signed)
See note

## 2018-05-27 NOTE — Telephone Encounter (Signed)
Copied from [place] [id number]. Topic: Quick Communication - See Telephone Encounter >> May 27, 2018 10:54 AM Blase Mess A wrote: CRM for notification. See Telephone encounter for: 05/27/18.  Patient is calling because she was admitted to [facility] and they made some changes to her medication.  Patient is in the process of doing FMLA for a systs to be removed-gynecologist is handling that.  Should behavorial health also handle the FMLA for 05/26/18 & 05/27/18. Was admitted to behavioral health on 05/25/18. Or should Dr. [name] assist her? Was admitted to [facility] for suicidal thoughts. Beverly Sessions was unsure how to complete the FMLA. Please advise [phone]

## 2018-05-27 NOTE — Telephone Encounter (Signed)
Spoke with patient regarding message,informed her Dr.Parker is out of the office.I suggested she talk with Behavior Health first,due to she was under their care.I will let her know what Dr.Parkers says once he returns. Patient voices understanding.

## 2018-05-27 NOTE — Telephone Encounter (Signed)
Patient has not been scheduled yet for surgery. I will complete FMLA when it is scheduled. Patient is aware.

## 2018-05-27 NOTE — Progress Notes (Signed)
PRE-OPERATIVE HISTORY AND PHYSICAL EXAM  PCP:  Vivi Barrack, MD Subjective:   HPI:  Tammy Knight is a 28 y.o. G0P0000.  Patient's last menstrual period was 05/20/2018.  She presents today for a pre-op discussion and PE.  She has the following symptoms: She has intermittent pelvic pain was found by CT to have a right ovarian teratoma.  8 x 11 cm  Review of Systems:   Constitutional: Denied constitutional symptoms, night sweats, recent illness, fatigue, fever, insomnia and weight loss.  Eyes: Denied eye symptoms, eye pain, photophobia, vision change and visual disturbance.  Ears/Nose/Throat/Neck: Denied ear, nose, throat or neck symptoms, hearing loss, nasal discharge, sinus congestion and sore throat.  Cardiovascular: Denied cardiovascular symptoms, arrhythmia, chest pain/pressure, edema, exercise intolerance, orthopnea and palpitations.  Respiratory: Denied pulmonary symptoms, asthma, pleuritic pain, productive sputum, cough, dyspnea and wheezing.  Gastrointestinal: Denied, gastro-esophageal reflux, melena, nausea and vomiting.  Genitourinary: Denied genitourinary symptoms including symptomatic vaginal discharge, pelvic relaxation issues, and urinary complaints.  Musculoskeletal: Denied musculoskeletal symptoms, stiffness, swelling, muscle weakness and myalgia.  Dermatologic: Denied dermatology symptoms, rash and scar.  Neurologic: Denied neurology symptoms, dizziness, headache, neck pain and syncope.  Psychiatric: Denied psychiatric symptoms, anxiety and depression.  Endocrine: Denied endocrine symptoms including hot flashes and night sweats.   OB History  Gravida Para Term Preterm AB Living  0 0 0 0 0 0  SAB TAB Ectopic Multiple Live Births  0 0 0 0      Past Medical History:  Diagnosis Date  . Asthma   . Chlamydia   . Depression   . GERD (gastroesophageal reflux disease)   . Hay fever   . UTI (urinary tract infection)     Past Surgical History:    Procedure Laterality Date  . ABDOMINOPLASTY  2014  . TONSILLECTOMY        SOCIAL HISTORY: Social History   Tobacco Use  Smoking Status Former Smoker  Smokeless Tobacco Never Used   Social History   Substance and Sexual Activity  Alcohol Use Yes  . Alcohol/week: 5.0 standard drinks  . Types: 5 Glasses of wine per week   Comment: Occasional   Social History   Substance and Sexual Activity  Drug Use No    Family History  Problem Relation Age of Onset  . Heart attack Father        2014  . Alcohol abuse Father   . Depression Father   . Hyperlipidemia Father   . Hypertension Father   . COPD Mother   . Glaucoma Mother   . Arthritis Mother   . Depression Mother   . Depression Sister   . Hearing loss Sister   . Diabetes Paternal Grandfather     ALLERGIES:  Penicillins and Bee venom  MEDS:   Current Outpatient Medications on File Prior to Visit  Medication Sig Dispense Refill  . clonazePAM (KLONOPIN) 0.5 MG tablet Take 1 tablet (0.5 mg total) by mouth 2 (two) times daily as needed for anxiety. 20 tablet 1  . escitalopram (LEXAPRO) 10 MG tablet Take 1 tablet (10 mg total) by mouth daily. (Patient taking differently: Take 20 mg by mouth daily. ) 90 tablet 3  . Dulaglutide (TRULICITY) 1.5 KG/4.0NU SOPN Inject 1.5mg  weekly. (Patient not taking: Reported on 05/20/2018) 4 pen 3  . Misc Natural Products (COLON CLEANSE) CAPS Take 2 capsules by mouth daily.     No current facility-administered medications on file prior to visit.  No orders of the defined types were placed in this encounter.    Physical examination BP 108/65   Pulse 66   Ht 5\' 5"  (1.651 m)   Wt 192 lb 9.6 oz (87.4 kg)   LMP 05/20/2018   BMI 32.05 kg/m   General NAD, Conversant  HEENT Atraumatic; Op clear with mmm.  Normo-cephalic. Pupils reactive. Anicteric sclerae  Thyroid/Neck Smooth without nodularity or enlargement. Normal ROM.  Neck Supple.  Skin No rashes, lesions or ulceration. Normal  palpated skin turgor. No nodularity.  Breasts: No masses or discharge.  Symmetric.  No axillary adenopathy.  Lungs: Clear to auscultation.No rales or wheezes. Normal Respiratory effort, no retractions.  Heart: NSR.  No murmurs or rubs appreciated. No periferal edema  Abdomen: Soft.  Non-tender.  No masses.  No HSM. No hernia  Extremities: Moves all appropriately.  Normal ROM for age. No lymphadenopathy.  Neuro: Oriented to PPT.  Normal mood. Normal affect.     Pelvic:   Vulva: Normal appearance.  No lesions.  Vagina: No lesions or abnormalities noted.  Support: Normal pelvic support.  Urethra No masses tenderness or scarring.  Meatus Normal size without lesions or prolapse.  Cervix: Normal ectropion.  No lesions.  Anus: Normal exam.  No lesions.  Perineum: Normal exam.  No lesions.        Bimanual   Uterus: Normal size.  Non-tender.  Mobile.  AV.  Adnexae:  Right adnexal mass palpated at pelvic exam  Cul-de-sac: Negative for abnormality.   Assessment:   G0P0000 Patient Active Problem List   Diagnosis Date Noted  . Class 1 obesity without serious comorbidity with body mass index (BMI) of 31.0 to 31.9 in adult 03/04/2018  . PMS (premenstrual syndrome) 12/13/2017  . Anxiety disorder 08/13/2017  . Constipation 08/13/2017  . Right wrist pain 07/15/2017  . Right leg pain 07/15/2017    1. Teratoma of right ovary   2. Pelvic pain in female      Plan:   Orders: No orders of the defined types were placed in this encounter.    1.  Exploratory laparoscopy right oophorectomy  Pre-op discussions regarding Risks and Benefits of her scheduled surgery.  Pelvic mass The patient and I have reviewed the option of surgery and have discussed the differential diagnosis of a pelvic mass in someone her age.  We have discussed the possibility of oophorectomy, both unilateral and bilateral.  We have also discussed the possibility of accompanying abdominal hysterectomy, should this be deemed  necessary at the time of surgery.  Although the chance of malignancy is small, this has also been discussed with the patient and the possibility of lymph node dissection or more extensive debulking surgery has been discussed in detail.  The possibility of another surgeon being called to perform other additional procedures, should they be necessary, has been discussed.  In addition to the above noted complications of a pelvic mass, we have also discussed the general complications found at any surgical procedure including bleeding, infection, damage to bowel, bladder, ureters or other internal organ, and the risk of anesthesia.  The possible conversion of the case from laparoscopic to laparotomy was also discussed in detail.  The above risks were specifically discussed, but the patient has been informed her complications may not be limited to them.  I have answered all of the patient's questions and I believe she has an informed understanding of the surgery for pelvic mass.   I spent 26 minutes involved in the care  of this patient of which greater than 50% was spent discussing ovarian teratoma, risks and benefits of surgery, time off of work, follow-up, all questions answered.   Finis Bud, M.D. 05/27/2018 9:17 AM

## 2018-05-27 NOTE — Progress Notes (Signed)
Patient comes in today for her Pre op appointment.

## 2018-05-30 NOTE — Telephone Encounter (Signed)
Recommend behavioral health work on [employer] since they are the ones that were treating her during that time.  Algis Greenhouse. [name], MD 05/30/2018 10:57 AM

## 2018-05-30 NOTE — Telephone Encounter (Signed)
Please advise 

## 2018-05-30 NOTE — Telephone Encounter (Signed)
Patient notified and voices understanding 

## 2018-06-01 ENCOUNTER — Encounter: Payer: Self-pay | Admitting: Surgical

## 2018-06-01 NOTE — Telephone Encounter (Signed)
FMLA has been filled out and faxed. Will send patient my chart message.

## 2018-06-05 DIAGNOSIS — H5203 Hypermetropia, bilateral: Secondary | ICD-10-CM | POA: Diagnosis not present

## 2018-06-09 ENCOUNTER — Other Ambulatory Visit (HOSPITAL_COMMUNITY)
Admission: RE | Admit: 2018-06-09 | Discharge: 2018-06-09 | Disposition: A | Discharge status: Home / Self Care | Payer: 59 | Source: Ambulatory Visit | Attending: Family Medicine | Admitting: Family Medicine

## 2018-06-09 ENCOUNTER — Other Ambulatory Visit: Payer: Self-pay

## 2018-06-09 ENCOUNTER — Encounter
Admission: RE | Admit: 2018-06-09 | Discharge: 2018-06-09 | Disposition: A | Discharge status: Home / Self Care | Payer: 59 | Source: Ambulatory Visit | Attending: Obstetrics and Gynecology | Admitting: Obstetrics and Gynecology

## 2018-06-09 ENCOUNTER — Encounter: Payer: Self-pay | Admitting: Family Medicine

## 2018-06-09 ENCOUNTER — Ambulatory Visit (INDEPENDENT_AMBULATORY_CARE_PROVIDER_SITE_OTHER): Payer: 59 | Admitting: Family Medicine

## 2018-06-09 ENCOUNTER — Encounter: Payer: Self-pay | Admitting: Obstetrics & Gynecology

## 2018-06-09 VITALS — BP 118/72 | HR 68 | Temp 98.5°F | Ht 65.0 in | Wt 195.2 lb

## 2018-06-09 DIAGNOSIS — Z124 Encounter for screening for malignant neoplasm of cervix: Secondary | ICD-10-CM | POA: Diagnosis not present

## 2018-06-09 DIAGNOSIS — Z6832 Body mass index (BMI) 32.0-32.9, adult: Secondary | ICD-10-CM | POA: Diagnosis not present

## 2018-06-09 DIAGNOSIS — F324 Major depressive disorder, single episode, in partial remission: Secondary | ICD-10-CM

## 2018-06-09 DIAGNOSIS — Z13220 Encounter for screening for lipoid disorders: Secondary | ICD-10-CM | POA: Diagnosis not present

## 2018-06-09 DIAGNOSIS — Z0001 Encounter for general adult medical examination with abnormal findings: Secondary | ICD-10-CM

## 2018-06-09 DIAGNOSIS — F419 Anxiety disorder, unspecified: Secondary | ICD-10-CM

## 2018-06-09 MED ORDER — ESCITALOPRAM OXALATE 20 MG PO TABS: 20.0000 mg | ORAL_TABLET | Freq: Every day | ORAL | 3 refills | Status: DC

## 2018-06-09 MED ORDER — PHENTERMINE HCL 15 MG PO CAPS: 15.0000 mg | ORAL_CAPSULE | ORAL | 2 refills | Status: DC

## 2018-06-09 NOTE — Assessment & Plan Note (Signed)
BMI 32 today.  Will start low-dose phentermine.  Follow-up with me in 3 months.  Discussed potential side effects.  Discussed lifestyle modifications including importance of regular exercise and healthy diet.  Discussed importance of low-carb diet.  Appears that she is doing mostly fine for her major meals however has quite a few dietary indiscretions that are likely contributing to her weight gain.  Recommended Lisa 150 minutes of vigorous cardiovascular exercise weekly.  Recommended nutrition referral-patient deferred for today.

## 2018-06-09 NOTE — Progress Notes (Signed)
Subjective:  Tammy Knight is a 28 y.o. female who presents today for her annual comprehensive physical exam.    HPI:  She has no acute complaints today.   Her chronic medical conditions are outlined below:  BMI 32 We have tried several medications for appetite suppressant most recently Trulicity.  She has not been able to afford this.  She has been on phentermine in the past and did well.  Would like to restart today.  # Anxiety - On lexapro 44m daily and Klonopin as needed.  Lifestyle Diet: Tries eat a healthy and balanced diet with plenty of fruits and vegetables.  Occasionally has dietary indiscretion at work. Exercise: Goes to gym a few times per week.  Depression screen PHQ 2/9 08/13/2017  Decreased Interest 1  Down, Depressed, Hopeless 1  PHQ - 2 Score 2  Altered sleeping 1  Tired, decreased energy 1  Change in appetite 1  Feeling bad or failure about yourself  1  Trouble concentrating 1  Moving slowly or fidgety/restless 0  Suicidal thoughts 0  PHQ-9 Score 7  Difficult doing work/chores Very difficult    Health Maintenance Due  Topic Date Due  . PAP-Cervical Cytology Screening  04/17/2018  . PAP SMEAR-Modifier  04/17/2018     ROS: Per HPI, otherwise a complete review of systems was negative.   PMH:  The following were reviewed and entered/updated in epic: Past Medical History:  Diagnosis Date  . Asthma    h/o no inhalers  . Chlamydia   . Depression   . Hay fever   . UTI (urinary tract infection)    Patient Active Problem List   Diagnosis Date Noted  . BMI 32.0-32.9,adult 06/09/2018  . Depression, major, single episode, in partial remission ([place] 06/09/2018  . PMS (premenstrual syndrome) 12/13/2017  . Anxiety disorder 08/13/2017   Past Surgical History:  Procedure Laterality Date  . ABDOMINOPLASTY  2014  . TONSILLECTOMY      Family History  Problem Relation Age of Onset  . Heart attack Father        2014  . Alcohol abuse  Father   . Depression Father   . Hyperlipidemia Father   . Hypertension Father   . COPD Mother   . Glaucoma Mother   . Arthritis Mother   . Depression Mother   . Depression Sister   . Hearing loss Sister   . Diabetes Paternal Grandfather     Medications- reviewed and updated Current Outpatient Medications  Medication Sig Dispense Refill  . clonazePAM (KLONOPIN) 0.5 MG tablet Take 1 tablet (0.5 mg total) by mouth 2 (two) times daily as needed for anxiety. 20 tablet 1  . escitalopram (LEXAPRO) 20 MG tablet Take 1 tablet (20 mg total) by mouth daily. 90 tablet 3  . Melatonin 10 MG CAPS Take 10 mg by mouth at bedtime as needed (sleep).    . phentermine 15 MG capsule Take 1 capsule (15 mg total) by mouth every morning. 30 capsule 2   No current facility-administered medications for this visit.     Allergies-reviewed and updated Allergies  Allergen Reactions  . Penicillins Anaphylaxis    Did it involve swelling of the face/tongue/throat, SOB, or low BP? Unknown Did it involve sudden or severe rash/hives, skin peeling, or any reaction on the inside of your mouth or nose? Unknown Did you need to seek medical attention at a hospital or doctor's office? Unknown When did it last happen?childhood allergy If all above answers are "  NO", may proceed with cephalosporin use.   . Bee Venom Swelling    Social History   Socioeconomic History  . Marital status: Single    Spouse name: Not on file  . Number of children: Not on file  . Years of education: Not on file  . Highest education level: Not on file  Occupational History  . Not on file  Social Needs  . Financial resource strain: Not on file  . Food insecurity:    Worry: Not on file    Inability: Not on file  . Transportation needs:    Medical: Not on file    Non-medical: Not on file  Tobacco Use  . Smoking status: Former Smoker    Years: 5.00    Types: Cigarettes    Last attempt to quit: 01/07/2018    Years since  quitting: 0.4  . Smokeless tobacco: Never Used  . Tobacco comment: 1-2 cig per day  Substance and Sexual Activity  . Alcohol use: Yes    Alcohol/week: 5.0 standard drinks    Types: 5 Glasses of wine per week    Comment: Occasional  . Drug use: No  . Sexual activity: Yes    Partners: Male    Birth control/protection: Condom  Lifestyle  . Physical activity:    Days per week: Not on file    Minutes per session: Not on file  . Stress: Not on file  Relationships  . Social connections:    Talks on phone: Not on file    Gets together: Not on file    Attends religious service: Not on file    Active member of club or organization: Not on file    Attends meetings of clubs or organizations: Not on file    Relationship status: Not on file  Other Topics Concern  . Not on file  Social History Narrative  . Not on file    Objective:  Physical Exam: BP 118/72 (BP Location: Left Arm, Patient Position: Sitting, Cuff Size: Large)   Pulse 68   Temp 98.5 F (36.9 C) (Oral)   Ht 5' 5"  (1.651 m)   Wt 195 lb 4 oz (88.6 kg)   LMP 05/20/2018   SpO2 99%   BMI 32.49 kg/m   Body mass index is 32.49 kg/m. Wt Readings from Last 3 Encounters:  06/09/18 195 lb 4 oz (88.6 kg)  05/27/18 192 lb 9.6 oz (87.4 kg)  05/20/18 196 lb 12.8 oz (89.3 kg)   Gen: NAD, resting comfortably HEENT: TMs normal bilaterally. OP clear. No thyromegaly noted.  CV: RRR with no murmurs appreciated Pulm: NWOB, CTAB with no crackles, wheezes, or rhonchi GI: Normal bowel sounds present. Soft, Nontender, Nondistended. GU: Normal internal/external female genitalia.Chaperone present for exam. MSK: no edema, cyanosis, or clubbing noted Skin: warm, dry Neuro: CN2-12 grossly intact. Strength 5/5 in upper and lower extremities. Reflexes symmetric and intact bilaterally.  Psych: Normal affect and thought content  Assessment/Plan:  Depression, major, single episode, in partial remission (H[facility]) Stable.  Continue Lexapro 20 mg  daily.  BMI 32.0-32.9,adult BMI 32 today.  Will start low-dose phentermine.  Follow-up with me in 3 months.  Discussed potential side effects.  Discussed lifestyle modifications including importance of regular exercise and healthy diet.  Discussed importance of low-carb diet.  Appears that she is doing mostly fine for her major meals however has quite a few dietary indiscretions that are likely contributing to her weight gain.  Recommended Lisa 150 minutes of vigorous cardiovascular  exercise weekly.  Recommended nutrition referral-patient deferred for today.  Anxiety disorder Stable.  Continue Lexapro 20 mg daily and Klonopin as needed.  Check CBC, C met, and TSH.  Preventative Healthcare: Check lipid panel and Pap smear.  Patient Counseling(The following topics were reviewed and/or handout was given):  -Nutrition: Stressed importance of moderation in sodium/caffeine intake, saturated fat and cholesterol, caloric balance, sufficient intake of fresh fruits, vegetables, and fiber.  -Stressed the importance of regular exercise.   -Substance Abuse: Discussed cessation/primary prevention of tobacco, alcohol, or other drug use; driving or other dangerous activities under the influence; availability of treatment for abuse.   -Injury prevention: Discussed safety belts, safety helmets, smoke detector, smoking near bedding or upholstery.   -Sexuality: Discussed sexually transmitted diseases, partner selection, use of condoms, avoidance of unintended pregnancy and contraceptive alternatives.   -Dental health: Discussed importance of regular tooth brushing, flossing, and dental visits.  -Health maintenance and immunizations reviewed. Please refer to Health maintenance section.  Return to care in 1 year for next preventative visit.   Algis Greenhouse. [name], MD 06/09/2018 4:27 PM

## 2018-06-09 NOTE — Patient Instructions (Addendum)
It was very nice to see you today!  We will start phentermine.  Please take 1 pill daily in the morning.  Let me know if you have any significant side effects.  I will send in Lexapro 20 mg to your pharmacy.  We will check blood work today and your Pap smear.  Please come back to see me in 3 months, or sooner as needed.  Take care, Dr [name]   Preventive Care 18-39 Years, Female Preventive care refers to lifestyle choices and visits with your health care provider that can promote health and wellness. What does preventive care include?   A yearly physical exam. This is also called an annual well check.  Dental exams once or twice a year.  Routine eye exams. Ask your health care provider how often you should have your eyes checked.  Personal lifestyle choices, including: ? Daily care of your teeth and gums. ? Regular physical activity. ? Eating a healthy diet. ? Avoiding tobacco and drug use. ? Limiting alcohol use. ? Practicing safe sex. ? Taking vitamin and mineral supplements as recommended by your health care provider. What happens during an annual well check? The services and screenings done by your health care provider during your annual well check will depend on your age, overall health, lifestyle risk factors, and family history of disease. Counseling Your health care provider may ask you questions about your:  Alcohol use.  Tobacco use.  Drug use.  Emotional well-being.  Home and relationship well-being.  Sexual activity.  Eating habits.  Work and work Statistician.  Method of birth control.  Menstrual cycle.  Pregnancy history. Screening You may have the following tests or measurements:  Height, weight, and BMI.  Diabetes screening. This is done by checking your blood sugar (glucose) after you have not eaten for a while (fasting).  Blood pressure.  Lipid and cholesterol levels. These may be checked every 5 years starting at age 24.  Skin  check.  Hepatitis C blood test.  Hepatitis B blood test.  Sexually transmitted disease (STD) testing.  BRCA-related cancer screening. This may be done if you have a family history of breast, ovarian, tubal, or peritoneal cancers.  Pelvic exam and Pap test. This may be done every 3 years starting at age 6. Starting at age 34, this may be done every 5 years if you have a Pap test in combination with an HPV test. Discuss your test results, treatment options, and if necessary, the need for more tests with your health care provider. Vaccines Your health care provider may recommend certain vaccines, such as:  Influenza vaccine. This is recommended every year.  Tetanus, diphtheria, and acellular pertussis (Tdap, Td) vaccine. You may need a Td booster every 10 years.  Varicella vaccine. You may need this if you have not been vaccinated.  HPV vaccine. If you are 55 or younger, you may need three doses over 6 months.  Measles, mumps, and rubella (MMR) vaccine. You may need at least one dose of MMR. You may also need a second dose.  Pneumococcal 13-valent conjugate (PCV13) vaccine. You may need this if you have certain conditions and were not previously vaccinated.  Pneumococcal polysaccharide (PPSV23) vaccine. You may need one or two doses if you smoke cigarettes or if you have certain conditions.  Meningococcal vaccine. One dose is recommended if you are age 19-21 years and a first-year college student living in a residence hall, or if you have one of several medical conditions. You may  also need additional booster doses.  Hepatitis A vaccine. You may need this if you have certain conditions or if you travel or work in places where you may be exposed to hepatitis A.  Hepatitis B vaccine. You may need this if you have certain conditions or if you travel or work in places where you may be exposed to hepatitis B.  Haemophilus influenzae type b (Hib) vaccine. You may need this if you have  certain risk factors. Talk to your health care provider about which screenings and vaccines you need and how often you need them. This information is not intended to replace advice given to you by your health care provider. Make sure you discuss any questions you have with your health care provider. Document Released: 06/02/2001 Document Revised: 11/17/2016 Document Reviewed: 02/05/2015 Elsevier Interactive Patient Education  2019 [employer].

## 2018-06-09 NOTE — Assessment & Plan Note (Signed)
Stable.  Continue Lexapro 20 mg daily and Klonopin as needed.  Check CBC, C met, and TSH.

## 2018-06-09 NOTE — Assessment & Plan Note (Signed)
Stable.  Continue Lexapro 20mg daily

## 2018-06-09 NOTE — Patient Instructions (Signed)
Your procedure is scheduled on: 06-13-18 MONDAY Report to Same Day Surgery 2nd floor medical mall [facility] Entrance-take elevator on left to 2nd floor.  Check in with surgery information desk.) To find out your arrival time please call [phone] between 1PM - 3PM on 06-10-18 FRIDAY  Remember: Instructions that are not followed completely may result in serious medical risk, up to and including death, or upon the discretion of your surgeon and anesthesiologist your surgery may need to be rescheduled.    _x___ 1. Do not eat food after midnight the night before your procedure. NO GUM OR CANDY AFTER MIDNIGHT.  You may drink clear liquids up to 2 hours before you are scheduled to arrive at the hospital for your procedure.  Do not drink clear liquids within 2 hours of your scheduled arrival to the hospital.  Clear liquids include  --Water or Apple juice without pulp  --Clear carbohydrate beverage such as ClearFast or Gatorade  --Black Coffee or Clear Tea (No milk, no creamers, do not add anything to the coffee or Tea   ____Ensure clear carbohydrate drink on the way to the hospital for bariatric patients  _X___Ensure clear carbohydrate drink 3 hours before surgery      __x__ 2. No Alcohol for 24 hours before or after surgery.   __x__3. No Smoking or e-cigarettes for 24 prior to surgery.  Do not use any chewable tobacco products for at least 6 hour prior to surgery   ____  4. Bring all medications with you on the day of surgery if instructed.    __x__ 5. Notify your doctor if there is any change in your medical condition     (cold, fever, infections).    x___6. On the morning of surgery brush your teeth with toothpaste and water.  You may rinse your mouth with mouth wash if you wish.  Do not swallow any toothpaste or mouthwash.   Do not wear jewelry, make-up, hairpins, clips or nail polish.  Do not wear lotions, powders, or perfumes. You may wear deodorant.  Do not shave 48 hours  prior to surgery. Men may shave face and neck.  Do not bring valuables to the hospital.    [facility] is not responsible for any belongings or valuables.               Contacts, dentures or bridgework may not be worn into surgery.  Leave your suitcase in the car. After surgery it may be brought to your room.  For patients admitted to the hospital, discharge time is determined by your treatment team.  _  Patients discharged the day of surgery will not be allowed to drive home.  You will need someone to drive you home and stay with you the night of your procedure.    Please read over the following fact sheets that you were given:   [facility] Preparing for Surgery   _x___ Take anti-hypertensive listed below, cardiac, seizure, asthma, anti-reflux and psychiatric medicines. These include:  1. YOU MAY TAKE KLONOPIN DAY OF SURGERY IF NEEDED WITH A SMALL SIP OF WATER  2.  3.  4.  5.  6.  ____Fleets enema or Magnesium Citrate as directed.   _x___ Use CHG Soap or sage wipes as directed on instruction sheet   ____ Use inhalers on the day of surgery and bring to hospital day of surgery  ____ Stop Metformin and Janumet 2 days prior to surgery.    ____ Take 1/2 of usual  insulin dose the night before surgery and none on the morning surgery.   ____ Follow recommendations from Cardiologist, Pulmonologist or PCP regarding stopping Aspirin, Coumadin, Plavix ,Eliquis, Effient, or Pradaxa, and Pletal.  X____Stop Anti-inflammatories such as Advil, Aleve, Ibuprofen, Motrin, Naproxen, Naprosyn, Goodies powders or aspirin products NOW-OK to take Tylenol    _x___ Stop supplements until after surgery-STOP MELATONIN NOW-MAY RESUME AFTER SURGERY   ____ Bring C-Pap to the hospital.

## 2018-06-09 NOTE — Addendum Note (Signed)
Addended by: Francis Dowse T on: 06/09/2018 04:37 PM   Modules accepted: Orders

## 2018-06-10 ENCOUNTER — Encounter
Admission: RE | Admit: 2018-06-10 | Discharge: 2018-06-10 | Disposition: A | Discharge status: Home / Self Care | Payer: 59 | Source: Ambulatory Visit | Attending: Obstetrics and Gynecology | Admitting: Obstetrics and Gynecology

## 2018-06-10 DIAGNOSIS — Z01812 Encounter for preprocedural laboratory examination: Secondary | ICD-10-CM

## 2018-06-10 DIAGNOSIS — R102 Pelvic and perineal pain: Secondary | ICD-10-CM | POA: Diagnosis present

## 2018-06-10 DIAGNOSIS — E669 Obesity, unspecified: Secondary | ICD-10-CM | POA: Diagnosis not present

## 2018-06-10 DIAGNOSIS — Z880 Allergy status to penicillin: Secondary | ICD-10-CM | POA: Diagnosis not present

## 2018-06-10 DIAGNOSIS — F329 Major depressive disorder, single episode, unspecified: Secondary | ICD-10-CM | POA: Diagnosis not present

## 2018-06-10 DIAGNOSIS — Z87891 Personal history of nicotine dependence: Secondary | ICD-10-CM | POA: Diagnosis not present

## 2018-06-10 DIAGNOSIS — N8312 Corpus luteum cyst of left ovary: Secondary | ICD-10-CM | POA: Diagnosis not present

## 2018-06-10 DIAGNOSIS — Z79899 Other long term (current) drug therapy: Secondary | ICD-10-CM | POA: Diagnosis not present

## 2018-06-10 DIAGNOSIS — J45909 Unspecified asthma, uncomplicated: Secondary | ICD-10-CM | POA: Diagnosis not present

## 2018-06-10 DIAGNOSIS — Z6832 Body mass index (BMI) 32.0-32.9, adult: Secondary | ICD-10-CM | POA: Diagnosis not present

## 2018-06-10 DIAGNOSIS — D270 Benign neoplasm of right ovary: Secondary | ICD-10-CM | POA: Diagnosis present

## 2018-06-10 DIAGNOSIS — Z91030 Bee allergy status: Secondary | ICD-10-CM | POA: Diagnosis not present

## 2018-06-10 DIAGNOSIS — F419 Anxiety disorder, unspecified: Secondary | ICD-10-CM | POA: Diagnosis not present

## 2018-06-10 DIAGNOSIS — D271 Benign neoplasm of left ovary: Secondary | ICD-10-CM | POA: Diagnosis not present

## 2018-06-10 LAB — CBC
HCT: 37.1 % (ref 35.0–45.0)
HCT: 36 % (ref 36.0–46.0)
HEMOGLOBIN: 12.1 g/dL (ref 12.0–15.0)
Hemoglobin: 12.5 g/dL (ref 11.7–15.5)
MCH: 31.8 pg (ref 27.0–33.0)
MCH: 31.7 pg (ref 26.0–34.0)
MCHC: 33.7 g/dL (ref 32.0–36.0)
MCHC: 33.6 g/dL (ref 30.0–36.0)
MCV: 94.4 fL (ref 80.0–100.0)
MCV: 94.2 fL (ref 80.0–100.0)
MPV: 11.3 fL (ref 7.5–12.5)
Platelets: 259 10*3/uL (ref 140–400)
Platelets: 245 10*3/uL (ref 150–400)
RBC: 3.93 10*6/uL (ref 3.80–5.10)
RBC: 3.82 MIL/uL — ABNORMAL LOW (ref 3.87–5.11)
RDW: 11.6 % (ref 11.0–15.0)
RDW: 12 % (ref 11.5–15.5)
WBC: 8.1 10*3/uL (ref 3.8–10.8)
WBC: 8 10*3/uL (ref 4.0–10.5)
nRBC: 0 % (ref 0.0–0.2)

## 2018-06-10 LAB — LIPID PANEL
Cholesterol: 155 mg/dL (ref <200)
HDL: 58 mg/dL (ref >=50)
LDL Cholesterol (Calc): 87 mg/dL (calc)
Non-HDL Cholesterol (Calc): 97 mg/dL (calc) (ref <130)
Total CHOL/HDL Ratio: 2.7 (calc) (ref <5.0)
Triglycerides: 35 mg/dL (ref <150)

## 2018-06-10 LAB — COMPREHENSIVE METABOLIC PANEL
AG Ratio: 2.1 (calc) (ref 1.0–2.5)
ALT: 11 U/L (ref 6–29)
AST: 16 U/L (ref 10–30)
Albumin: 4.6 g/dL (ref 3.6–5.1)
Alkaline phosphatase (APISO): 42 U/L (ref 31–125)
BUN: 11 mg/dL (ref 7–25)
CO2: 25 mmol/L (ref 20–32)
Calcium: 9.4 mg/dL (ref 8.6–10.2)
Chloride: 104 mmol/L (ref 98–110)
Creat: 0.53 mg/dL (ref 0.50–1.10)
Globulin: 2.2 g/dL (calc) (ref 1.9–3.7)
Glucose, Bld: 82 mg/dL (ref 65–99)
Potassium: 3.5 mmol/L (ref 3.5–5.3)
Sodium: 138 mmol/L (ref 135–146)
Total Bilirubin: 0.8 mg/dL (ref 0.2–1.2)
Total Protein: 6.8 g/dL (ref 6.1–8.1)

## 2018-06-10 LAB — TYPE AND SCREEN
ABO/RH(D): A POS
Antibody Screen: NEGATIVE

## 2018-06-10 LAB — HCG, QUANTITATIVE, PREGNANCY: hCG, Beta Chain, Quant, S: <1 m[IU]/mL (ref <5)

## 2018-06-10 LAB — TSH: TSH: 1.48 m[IU]/L

## 2018-06-12 ENCOUNTER — Encounter: Payer: Self-pay | Admitting: Anesthesiology

## 2018-06-13 ENCOUNTER — Ambulatory Visit: Payer: 59 | Admitting: Anesthesiology

## 2018-06-13 ENCOUNTER — Other Ambulatory Visit: Payer: Self-pay

## 2018-06-13 ENCOUNTER — Encounter
Admission: RE | Disposition: A | Discharge status: Home / Self Care | Payer: Self-pay | Source: Home / Self Care | Attending: Obstetrics and Gynecology

## 2018-06-13 ENCOUNTER — Ambulatory Visit
Admission: RE | Admit: 2018-06-13 | Discharge: 2018-06-13 | Disposition: A | Discharge status: Home / Self Care | Payer: 59 | Attending: Obstetrics and Gynecology | Admitting: Obstetrics and Gynecology

## 2018-06-13 ENCOUNTER — Encounter: Payer: Self-pay | Admitting: *Deleted

## 2018-06-13 DIAGNOSIS — D271 Benign neoplasm of left ovary: Secondary | ICD-10-CM | POA: Diagnosis not present

## 2018-06-13 DIAGNOSIS — J45909 Unspecified asthma, uncomplicated: Secondary | ICD-10-CM | POA: Diagnosis not present

## 2018-06-13 DIAGNOSIS — E669 Obesity, unspecified: Secondary | ICD-10-CM | POA: Insufficient documentation

## 2018-06-13 DIAGNOSIS — Z6832 Body mass index (BMI) 32.0-32.9, adult: Secondary | ICD-10-CM | POA: Insufficient documentation

## 2018-06-13 DIAGNOSIS — F329 Major depressive disorder, single episode, unspecified: Secondary | ICD-10-CM | POA: Diagnosis not present

## 2018-06-13 DIAGNOSIS — D3911 Neoplasm of uncertain behavior of right ovary: Secondary | ICD-10-CM | POA: Diagnosis not present

## 2018-06-13 DIAGNOSIS — Z87891 Personal history of nicotine dependence: Secondary | ICD-10-CM | POA: Diagnosis not present

## 2018-06-13 DIAGNOSIS — D270 Benign neoplasm of right ovary: Secondary | ICD-10-CM | POA: Diagnosis not present

## 2018-06-13 DIAGNOSIS — N8311 Corpus luteum cyst of right ovary: Secondary | ICD-10-CM | POA: Diagnosis not present

## 2018-06-13 DIAGNOSIS — Z880 Allergy status to penicillin: Secondary | ICD-10-CM | POA: Diagnosis not present

## 2018-06-13 DIAGNOSIS — N8312 Corpus luteum cyst of left ovary: Secondary | ICD-10-CM | POA: Insufficient documentation

## 2018-06-13 DIAGNOSIS — Z79899 Other long term (current) drug therapy: Secondary | ICD-10-CM | POA: Diagnosis not present

## 2018-06-13 DIAGNOSIS — Z91030 Bee allergy status: Secondary | ICD-10-CM | POA: Insufficient documentation

## 2018-06-13 DIAGNOSIS — F419 Anxiety disorder, unspecified: Secondary | ICD-10-CM | POA: Diagnosis not present

## 2018-06-13 DIAGNOSIS — R102 Pelvic and perineal pain: Secondary | ICD-10-CM | POA: Diagnosis not present

## 2018-06-13 HISTORY — PX: LAPAROSCOPIC SALPINGO OOPHERECTOMY: SHX5927

## 2018-06-13 LAB — POCT PREGNANCY, URINE: Preg Test, Ur: NEGATIVE

## 2018-06-13 LAB — CYTOLOGY - PAP: Diagnosis: NEGATIVE

## 2018-06-13 LAB — ABO/RH: ABO/RH(D): A POS

## 2018-06-13 SURGERY — SALPINGO-OOPHORECTOMY, LAPAROSCOPIC
Anesthesia: General | Laterality: Right

## 2018-06-13 MED ORDER — GLYCOPYRROLATE 0.2 MG/ML IJ SOLN
INTRAMUSCULAR | Status: DC | PRN
  Administered 2018-06-13: 0.2 mg via INTRAVENOUS

## 2018-06-13 MED ORDER — OXYCODONE-ACETAMINOPHEN 5-325 MG PO TABS: 1.0000 | ORAL_TABLET | ORAL | 0 refills | Status: DC | PRN

## 2018-06-13 MED ORDER — BUPIVACAINE HCL (PF) 0.5 % IJ SOLN
INTRAMUSCULAR | Status: AC
  Filled 2018-06-13: qty 30

## 2018-06-13 MED ORDER — FENTANYL CITRATE (PF) 100 MCG/2ML IJ SOLN
INTRAMUSCULAR | Status: AC
  Administered 2018-06-13: 25 ug via INTRAVENOUS
  Filled 2018-06-13: qty 2

## 2018-06-13 MED ORDER — DEXAMETHASONE SODIUM PHOSPHATE 4 MG/ML IJ SOLN
INTRAMUSCULAR | Status: AC
  Filled 2018-06-13: qty 1

## 2018-06-13 MED ORDER — ACETAMINOPHEN NICU IV SYRINGE 10 MG/ML
INTRAVENOUS | Status: AC
  Filled 2018-06-13: qty 1

## 2018-06-13 MED ORDER — ROCURONIUM BROMIDE 100 MG/10ML IV SOLN
INTRAVENOUS | Status: DC | PRN
  Administered 2018-06-13: 10 mg via INTRAVENOUS
  Administered 2018-06-13: 40 mg via INTRAVENOUS

## 2018-06-13 MED ORDER — PROPOFOL 10 MG/ML IV BOLUS
INTRAVENOUS | Status: AC
  Filled 2018-06-13: qty 20

## 2018-06-13 MED ORDER — PROPOFOL 10 MG/ML IV BOLUS
INTRAVENOUS | Status: DC | PRN
  Administered 2018-06-13: 130 mg via INTRAVENOUS

## 2018-06-13 MED ORDER — ACETAMINOPHEN 10 MG/ML IV SOLN
INTRAVENOUS | Status: DC | PRN
  Administered 2018-06-13: 1000 mg via INTRAVENOUS

## 2018-06-13 MED ORDER — EPHEDRINE SULFATE 50 MG/ML IJ SOLN
INTRAMUSCULAR | Status: DC | PRN
  Administered 2018-06-13: 5 mg via INTRAVENOUS
  Administered 2018-06-13: 10 mg via INTRAVENOUS

## 2018-06-13 MED ORDER — DEXMEDETOMIDINE HCL 200 MCG/2ML IV SOLN
INTRAVENOUS | Status: DC | PRN
  Administered 2018-06-13: 8 ug via INTRAVENOUS

## 2018-06-13 MED ORDER — BUPIVACAINE HCL 0.5 % IJ SOLN
INTRAMUSCULAR | Status: DC | PRN
  Administered 2018-06-13: 13 mL

## 2018-06-13 MED ORDER — MIDAZOLAM HCL 2 MG/2ML IJ SOLN
INTRAMUSCULAR | Status: DC | PRN
  Administered 2018-06-13: 2 mg via INTRAVENOUS

## 2018-06-13 MED ORDER — ONDANSETRON HCL 4 MG/2ML IJ SOLN
4.0000 mg | Freq: Once | INTRAMUSCULAR | Status: AC | PRN
  Administered 2018-06-13: 4 mg via INTRAVENOUS

## 2018-06-13 MED ORDER — KETOROLAC TROMETHAMINE 30 MG/ML IJ SOLN
INTRAMUSCULAR | Status: AC
  Administered 2018-06-13: 30 mg via INTRAVENOUS
  Filled 2018-06-13: qty 1

## 2018-06-13 MED ORDER — FAMOTIDINE 20 MG PO TABS
20.0000 mg | ORAL_TABLET | Freq: Once | ORAL | Status: AC
  Administered 2018-06-13: 20 mg via ORAL

## 2018-06-13 MED ORDER — ONDANSETRON HCL 4 MG/2ML IJ SOLN
INTRAMUSCULAR | Status: AC
  Administered 2018-06-13: 4 mg via INTRAVENOUS
  Filled 2018-06-13: qty 2

## 2018-06-13 MED ORDER — FENTANYL CITRATE (PF) 100 MCG/2ML IJ SOLN
INTRAMUSCULAR | Status: DC | PRN
  Administered 2018-06-13 (×4): 50 ug via INTRAVENOUS

## 2018-06-13 MED ORDER — LIDOCAINE HCL (CARDIAC) PF 100 MG/5ML IV SOSY
PREFILLED_SYRINGE | INTRAVENOUS | Status: DC | PRN
  Administered 2018-06-13: 80 mg via INTRAVENOUS

## 2018-06-13 MED ORDER — FENTANYL CITRATE (PF) 100 MCG/2ML IJ SOLN
INTRAMUSCULAR | Status: AC
  Filled 2018-06-13: qty 2

## 2018-06-13 MED ORDER — FENTANYL CITRATE (PF) 100 MCG/2ML IJ SOLN
25.0000 ug | INTRAMUSCULAR | Status: DC | PRN
  Administered 2018-06-13 (×4): 25 ug via INTRAVENOUS

## 2018-06-13 MED ORDER — FAMOTIDINE 20 MG PO TABS
ORAL_TABLET | ORAL | Status: AC
  Filled 2018-06-13: qty 1

## 2018-06-13 MED ORDER — MIDAZOLAM HCL 2 MG/2ML IJ SOLN
INTRAMUSCULAR | Status: AC
  Filled 2018-06-13: qty 2

## 2018-06-13 MED ORDER — LACTATED RINGERS IV SOLN: INTRAVENOUS | Status: DC

## 2018-06-13 MED ORDER — KETOROLAC TROMETHAMINE 30 MG/ML IJ SOLN
30.0000 mg | Freq: Once | INTRAMUSCULAR | Status: AC
  Administered 2018-06-13: 30 mg via INTRAVENOUS
  Filled 2018-06-13: qty 1

## 2018-06-13 MED ORDER — DEXAMETHASONE SODIUM PHOSPHATE 10 MG/ML IJ SOLN
INTRAMUSCULAR | Status: DC | PRN
  Administered 2018-06-13: 4 mg via INTRAVENOUS

## 2018-06-13 MED ORDER — LACTATED RINGERS IV SOLN
INTRAVENOUS | Status: DC
  Administered 2018-06-13 (×2): via INTRAVENOUS

## 2018-06-13 MED ORDER — ONDANSETRON HCL 4 MG/2ML IJ SOLN
INTRAMUSCULAR | Status: DC | PRN
  Administered 2018-06-13: 4 mg via INTRAVENOUS

## 2018-06-13 MED ORDER — ONDANSETRON HCL 4 MG/2ML IJ SOLN
INTRAMUSCULAR | Status: AC
  Filled 2018-06-13: qty 2

## 2018-06-13 MED ORDER — SUGAMMADEX SODIUM 200 MG/2ML IV SOLN
INTRAVENOUS | Status: AC
  Filled 2018-06-13: qty 2

## 2018-06-13 MED ORDER — SUGAMMADEX SODIUM 200 MG/2ML IV SOLN
INTRAVENOUS | Status: DC | PRN
  Administered 2018-06-13: 200 mg via INTRAVENOUS

## 2018-06-13 SURGICAL SUPPLY — 50 items
ANCHOR TIS RET SYS 1550ML (BAG) ×1 IMPLANT
ANCHOR TIS RET SYS 235ML (MISCELLANEOUS) ×1 IMPLANT
BAG DECANTER FOR FLEXI CONT (MISCELLANEOUS) ×2 IMPLANT
BLADE SURG 15 STRL LF DISP TIS (BLADE) ×1 IMPLANT
BLADE SURG 15 STRL SS (BLADE) ×1
BLADE SURG SZ11 CARB STEEL (BLADE) ×2 IMPLANT
CANISTER SUCT 1200ML W/VALVE (MISCELLANEOUS) ×2 IMPLANT
CATH ROBINSON RED A/P 16FR (CATHETERS) ×2 IMPLANT
CHLORAPREP W/TINT 26ML (MISCELLANEOUS) ×2 IMPLANT
COVER LIGHT HANDLE STERIS (MISCELLANEOUS) ×1 IMPLANT
COVER WAND RF STERILE (DRAPES) ×1 IMPLANT
DRSG TELFA 3X8 NADH (GAUZE/BANDAGES/DRESSINGS) ×2 IMPLANT
ELECT REM PT RETURN 9FT ADLT (ELECTROSURGICAL) ×2
ELECTRODE REM PT RTRN 9FT ADLT (ELECTROSURGICAL) ×1 IMPLANT
GLOVE BIOGEL PI ORTHO PRO 7.5 (GLOVE) ×1
GLOVE PI ORTHO PRO STRL 7.5 (GLOVE) ×1 IMPLANT
GLOVE SURG SYN 8.0 (GLOVE) ×2 IMPLANT
GLOVE SURG SYN 8.0 PF PI (GLOVE) ×1 IMPLANT
GOWN STRL REUS W/ TWL LRG LVL3 (GOWN DISPOSABLE) ×2 IMPLANT
GOWN STRL REUS W/TWL LRG LVL3 (GOWN DISPOSABLE) ×2
GOWN STRL REUS W/TWL XL LVL4 (GOWN DISPOSABLE) ×2 IMPLANT
GRASPER SUT TROCAR 14GX15 (MISCELLANEOUS) ×1 IMPLANT
HANDLE YANKAUER SUCT BULB TIP (MISCELLANEOUS) ×1 IMPLANT
IRRIGATION STRYKERFLOW (MISCELLANEOUS) IMPLANT
IRRIGATOR STRYKERFLOW (MISCELLANEOUS)
IV LACTATED RINGERS 1000ML (IV SOLUTION) ×1 IMPLANT
KIT PINK PAD W/HEAD ARE REST (MISCELLANEOUS) ×2
KIT PINK PAD W/HEAD ARM REST (MISCELLANEOUS) ×1 IMPLANT
KIT TURNOVER CYSTO (KITS) ×2 IMPLANT
LIGASURE LAP MARYLAND 5MM 37CM (ELECTROSURGICAL) ×1 IMPLANT
NEEDLE HYPO 22GX1.5 SAFETY (NEEDLE) ×2 IMPLANT
NS IRRIG 500ML POUR BTL (IV SOLUTION) ×2 IMPLANT
PACK GYN LAPAROSCOPIC (MISCELLANEOUS) ×2 IMPLANT
PAD DRESSING TELFA 3X8 NADH (GAUZE/BANDAGES/DRESSINGS) IMPLANT
PAD OB MATERNITY 4.3X12.25 (PERSONAL CARE ITEMS) ×2 IMPLANT
PAD PREP 24X41 OB/GYN DISP (PERSONAL CARE ITEMS) ×2 IMPLANT
SET TUBE SMOKE EVAC HIGH FLOW (TUBING) ×2 IMPLANT
SLEEVE ENDOPATH XCEL 5M (ENDOMECHANICALS) ×1 IMPLANT
STRIP CLOSURE SKIN 1/2X4 (GAUZE/BANDAGES/DRESSINGS) ×2 IMPLANT
SUCTION FRAZIER HANDLE 10FR (MISCELLANEOUS) ×1
SUCTION TUBE FRAZIER 10FR DISP (MISCELLANEOUS) IMPLANT
SUT VICRYL 0 AB UR-6 (SUTURE) ×2 IMPLANT
SUT VICRYL 4-0  27 PS-2 BARIAT (SUTURE) ×1
SUT VICRYL 4-0 27 PS-2 BARIAT (SUTURE) ×1
SUTURE VICRYL 4-0 27 PS-2 BART (SUTURE) ×1 IMPLANT
TOWEL OR 17X26 4PK STRL BLUE (TOWEL DISPOSABLE) ×2 IMPLANT
TROCAR ENDO BLADELESS 11MM (ENDOMECHANICALS) ×1 IMPLANT
TROCAR XCEL NON-BLD 5MMX100MML (ENDOMECHANICALS) ×2 IMPLANT
TROCAR XCEL UNIV SLVE 11M 100M (ENDOMECHANICALS) ×1 IMPLANT
TUBING CONNECTING 10 (TUBING) ×2 IMPLANT

## 2018-06-13 NOTE — Anesthesia Procedure Notes (Signed)
Procedure Name: Intubation Date/Time: 06/13/2018 11:37 AM Performed by: Hedda Slade, CRNA Pre-anesthesia Checklist: Patient identified, Patient being monitored, Timeout performed, Emergency Drugs available and Suction available Patient Re-evaluated:Patient Re-evaluated prior to induction Oxygen Delivery Method: Circle system utilized Preoxygenation: Pre-oxygenation with 100% oxygen Induction Type: IV induction Ventilation: Mask ventilation without difficulty Laryngoscope Size: Mac and 3 Grade View: Grade I Tube type: Oral Tube size: 7.0 mm Number of attempts: 1 Airway Equipment and Method: Stylet Placement Confirmation: ETT inserted through vocal cords under direct vision,  positive ETCO2 and breath sounds checked- equal and bilateral Secured at: 21 cm Tube secured with: Tape Dental Injury: Teeth and Oropharynx as per pre-operative assessment

## 2018-06-13 NOTE — Anesthesia Preprocedure Evaluation (Signed)
Anesthesia Evaluation  Patient identified by MRN, date of birth, ID band Patient awake    Reviewed: Allergy & Precautions, NPO status , Patient's Chart, lab work & pertinent test results, reviewed documented beta blocker date and time   Airway Mallampati: III  TM Distance: >3 FB     Dental  (+) Chipped   Pulmonary asthma , former smoker,           Cardiovascular      Neuro/Psych PSYCHIATRIC DISORDERS Anxiety Depression    GI/Hepatic   Endo/Other    Renal/GU      Musculoskeletal   Abdominal   Peds  Hematology   Anesthesia Other Findings Obese.  Reproductive/Obstetrics                             Anesthesia Physical Anesthesia Plan  ASA: III  Anesthesia Plan: General   Post-op Pain Management:    Induction: Intravenous  PONV Risk Score and Plan:   Airway Management Planned: Oral ETT  Additional Equipment:   Intra-op Plan:   Post-operative Plan:   Informed Consent: I have reviewed the patients History and Physical, chart, labs and discussed the procedure including the risks, benefits and alternatives for the proposed anesthesia with the patient or authorized representative who has indicated his/her understanding and acceptance.       Plan Discussed with: CRNA  Anesthesia Plan Comments:         Anesthesia Quick Evaluation

## 2018-06-13 NOTE — Interval H&P Note (Signed)
History and Physical Interval Note:  06/13/2018 10:40 AM  Tammy Knight  has presented today for surgery, with the diagnosis of TERATOMA OF RIGHT OVARY, PELVIC PAIN  The various methods of treatment have been discussed with the patient and family. After consideration of risks, benefits and other options for treatment, the patient has consented to  Procedure(s): LAPAROSCOPIC RIGHT SALPINGO OOPHORECTOMY (Right) as a surgical intervention .  The patient's history has been reviewed, patient examined, no change in status, stable for surgery.  I have reviewed the patient's chart and labs.  Questions were answered to the patient's satisfaction.     Jeannie Fend

## 2018-06-13 NOTE — Anesthesia Post-op Follow-up Note (Signed)
Anesthesia QCDR form completed.        

## 2018-06-13 NOTE — Op Note (Signed)
@  LOGO@    OPERATIVE NOTE 06/13/2018 1:24 PM  PRE-OPERATIVE DIAGNOSIS:  1) TERATOMA OF RIGHT OVARY, PELVIC PAIN  POST-OPERATIVE DIAGNOSIS:  * No Diagnosis Codes entered *  OPERATION:  LAPAROSCOPIC LEFT OOPHORECTOMY SURGEON(S): Surgeon(s) and Role:    Harlin Heys, MD - Primary    * Rubie Maid, MD - Assisting No other capable assistant available for this surgery which requires an experienced, high level assistant.  ANESTHESIA: General  ESTIMATED BLOOD LOSS: 25 mL  OPERATIVE FINDINGS: Left ovary enlarge c/w teratoma  SPECIMEN:  ID Type Source Tests Collected by Time Destination  1 : Left ovary  Tissue [facility] Gyn benign resection SURGICAL PATHOLOGY Harlin Heys, MD [id number]     COMPLICATIONS: None  DISPOSITION: Stable to recovery room  DESCRIPTION OF PROCEDURE:      The patient was prepped and draped in the dorsolithotomy position and placed under general anesthesia. The bladder was emptied. The cervix was grasped with a multi-toothed tenaculum and a uterine manipulator was placed within the cervical os respecting the position and curvature of the uterus. After changing gloves we proceeded abdominally. A small infraumbilical incision was made and a 5 mm trocar port was placed within the abdominopelvic cavity. The opening pressure was less than 7 mmHg.  Approximately 3 and 1/2 L of carbon dioxide gas was instilled within the abdominal pelvic cavity. The laparoscope was placed and the pelvis and abdomen were carefully inspected.  Right and left lower quadrant ports were placed in the usual manner with a 10 mm port placed in the left lower quadrant in anticipation of using an Endo Catch bag. Large left ovarian cyst noted on LEFT.  The right ovary appeared normal.  Remainder of pelvis was normal.  The infundibulopelvic ligament was coagulated and divided.  The utero-ovarinan ligament was coagulated and divided. The ovary was placed in a large Endo Catch bag and the bag  was removed to the abdominal wall.  The cyst was dissected free of fluid and we slowly and carefully dissected the ovary through the abdominal wall while maintaining the integrity of the Endo Catch bag.  Nothing was spilled into the abdominal cavity.  In this way we deliver the entire left ovary through the left lower quadrant port incision. Hemostasis of all areas of the pelvis was noted. The ports were removed under direct visualization.  Hemostasis was noted.  The laparoscope was removed the trocar sleeve was removed and the incision was closed with a deep suture through the fascia of 0 Vicryl followed by subcuticular closure of the skin for all port sites. A long-acting anesthetic was injected.  Steri-Strips were applied. The uterine manipulator was removed. Hemostasis of the cervix was noted. The patient went to the recovery room in stable condition.  Finis Bud, M.D. 06/13/2018 1:24 PM

## 2018-06-13 NOTE — Anesthesia Postprocedure Evaluation (Signed)
Anesthesia Post Note  Patient: Tammy Knight  Procedure(s) Performed: LAPAROSCOPIC RIGHT  OOPHORECTOMY (Right )  Patient location during evaluation: PACU Anesthesia Type: General Level of consciousness: awake and alert Pain management: pain level controlled Vital Signs Assessment: post-procedure vital signs reviewed and stable Respiratory status: spontaneous breathing, nonlabored ventilation, respiratory function stable and patient connected to nasal cannula oxygen Cardiovascular status: blood pressure returned to baseline and stable Postop Assessment: no apparent nausea or vomiting Anesthetic complications: no     Last Vitals:  Vitals:   06/13/18 1405 06/13/18 1444  BP: 105/66 108/62  Pulse: (!) 57 61  Resp: 18 18  Temp: (!) 36.1 C   SpO2: 100% 100%    Last Pain:  Vitals:   06/13/18 1405  TempSrc: Temporal  PainSc: 0-No pain                 Adit Riddles S

## 2018-06-13 NOTE — Transfer of Care (Signed)
Immediate Anesthesia Transfer of Care Note  Patient: Tammy Knight  Procedure(s) Performed: LAPAROSCOPIC RIGHT  OOPHORECTOMY (Right )  Patient Location: PACU  Anesthesia Type:General  Level of Consciousness: awake  Airway & Oxygen Therapy: Patient Spontanous Breathing and Patient connected to nasal cannula oxygen  Post-op Assessment: Report given to RN and Post -op Vital signs reviewed and stable  Post vital signs: stable  Last Vitals:  Vitals Value Taken Time  BP 125/72 06/13/2018  1:18 PM  Temp 36.4 C 06/13/2018  1:18 PM  Pulse 69 06/13/2018  1:22 PM  Resp 19 06/13/2018  1:22 PM  SpO2 100 % 06/13/2018  1:22 PM  Vitals shown include unvalidated device data.  Last Pain:  Vitals:   06/13/18 0846  TempSrc: Oral  PainSc: 0-No pain         Complications: No apparent anesthesia complications

## 2018-06-13 NOTE — Discharge Instructions (Signed)
AMBULATORY SURGERY  DISCHARGE INSTRUCTIONS   1) The drugs that you were given will stay in your system until tomorrow so for the next 24 hours you should not:  A) Drive an automobile B) Make any legal decisions C) Drink any alcoholic beverage   2) You may resume regular meals tomorrow.  Today it is better to start with liquids and gradually work up to solid foods.  You may eat anything you prefer, but it is better to start with liquids, then soup and crackers, and gradually work up to solid foods.   3) Please notify your doctor immediately if you have any unusual bleeding, trouble breathing, redness and pain at the surgery site, drainage, fever, or pain not relieved by medication.    4) Additional Instruct  May wet incision and pat dry, no submersion in tub or pool or hot tub  Do not lift anything heavier than 25 pounds  Do not drive if taking percocet  Increase activity slowly       Please contact your physician with any problems or Same Day Surgery at [phone], Monday through Friday 6 am to 4 pm, or [place] at [facility] number at [phone].

## 2018-06-13 NOTE — Progress Notes (Signed)
Please inform patient of the following:  Blood work and pap smear are NORMAL. We can recheck blood work in 1 year and pap in 3 years.  Algis Greenhouse. [name], MD 06/13/2018 12:59 PM

## 2018-06-15 LAB — SURGICAL PATHOLOGY

## 2018-06-21 ENCOUNTER — Ambulatory Visit (INDEPENDENT_AMBULATORY_CARE_PROVIDER_SITE_OTHER): Payer: 59 | Admitting: Obstetrics and Gynecology

## 2018-06-21 ENCOUNTER — Encounter: Payer: Self-pay | Admitting: Obstetrics and Gynecology

## 2018-06-21 VITALS — BP 118/70 | HR 61 | Ht 65.0 in | Wt 193.5 lb

## 2018-06-21 DIAGNOSIS — Z9889 Other specified postprocedural states: Secondary | ICD-10-CM

## 2018-06-21 NOTE — Progress Notes (Signed)
HPI:      [name] is a 28 y.o. G0P0000 who LMP was Patient's last menstrual period was 06/13/2018.  Subjective:   She presents today approximately 1 week from ovarian teratoma removal.  Was thought to be the right ovary by ultrasound but at the time of surgery was found to be the LEFT ovary. Patient reports that she is doing well without issue.  She would like to return to work today.  She has already begun walking on the treadmill.    Hx: The following portions of the patient's history were reviewed and updated as appropriate:             She  has a past medical history of Asthma, Chlamydia, Depression, Hay fever, and UTI (urinary tract infection). She does not have any pertinent problems on file. She  has a past surgical history that includes Tonsillectomy; Abdominoplasty (2014); and Laparoscopic salpingo oophorectomy (Right, 06/13/2018). Her family history includes Alcohol abuse in her father; Arthritis in her mother; COPD in her mother; Depression in her father, mother, and sister; Diabetes in her paternal grandfather; Glaucoma in her mother; Hearing loss in her sister; Heart attack in her father; Hyperlipidemia in her father; Hypertension in her father. She  reports that she quit smoking about 5 months ago. Her smoking use included cigarettes. She quit after 5.00 years of use. She has never used smokeless tobacco. She reports current alcohol use of about 5.0 standard drinks of alcohol per week. She reports that she does not use drugs. She has a current medication list which includes the following prescription(s): clonazepam, escitalopram, oxycodone-acetaminophen, and phentermine. She is allergic to penicillins and bee venom.       Review of Systems:  Review of Systems  Constitutional: Denied constitutional symptoms, night sweats, recent illness, fatigue, fever, insomnia and weight loss.  Eyes: Denied eye symptoms, eye pain, photophobia, vision change and visual  disturbance.  Ears/Nose/Throat/Neck: Denied ear, nose, throat or neck symptoms, hearing loss, nasal discharge, sinus congestion and sore throat.  Cardiovascular: Denied cardiovascular symptoms, arrhythmia, chest pain/pressure, edema, exercise intolerance, orthopnea and palpitations.  Respiratory: Denied pulmonary symptoms, asthma, pleuritic pain, productive sputum, cough, dyspnea and wheezing.  Gastrointestinal: Denied, gastro-esophageal reflux, melena, nausea and vomiting.  Genitourinary: Denied genitourinary symptoms including symptomatic vaginal discharge, pelvic relaxation issues, and urinary complaints.  Musculoskeletal: Denied musculoskeletal symptoms, stiffness, swelling, muscle weakness and myalgia.  Dermatologic: Denied dermatology symptoms, rash and scar.  Neurologic: Denied neurology symptoms, dizziness, headache, neck pain and syncope.  Psychiatric: Denied psychiatric symptoms, anxiety and depression.  Endocrine: Denied endocrine symptoms including hot flashes and night sweats.   Meds:   Current Outpatient Medications on File Prior to Visit  Medication Sig Dispense Refill  . clonazePAM (KLONOPIN) 0.5 MG tablet Take 1 tablet (0.5 mg total) by mouth 2 (two) times daily as needed for anxiety. 20 tablet 1  . escitalopram (LEXAPRO) 20 MG tablet Take 1 tablet (20 mg total) by mouth daily. 90 tablet 3  . oxyCODONE-acetaminophen (PERCOCET) 5-325 MG tablet Take 1 tablet by mouth every 4 (four) hours as needed for severe pain. 15 tablet 0  . phentermine 15 MG capsule Take 1 capsule (15 mg total) by mouth every morning. 30 capsule 2   No current facility-administered medications on file prior to visit.     Objective:     Vitals:   06/21/18 0946  BP: 118/70  Pulse: 61  Abdomen: Soft.  Non-tender.  No masses.  No HSM.  Incision/s: Intact.  Healing well.  No erythema.  No drainage.      Assessment:    G0P0000 Patient Active Problem List   Diagnosis Date Noted   . BMI 32.0-32.9,adult 06/09/2018  . Depression, major, single episode, in partial remission ([place]) 06/09/2018  . PMS (premenstrual syndrome) 12/13/2017  . Anxiety disorder 08/13/2017     1. Post-operative state     Excellent recovery   Plan:            1.  Surgery discussed in detail including left versus right ovary (left oophorectomy performed) Orders No orders of the defined types were placed in this encounter.   No orders of the defined types were placed in this encounter.     F/U  Return in about 6 weeks (around 08/02/2018).  Finis Bud, M.D. 06/21/2018 10:31 AM

## 2018-06-21 NOTE — Progress Notes (Signed)
Patient comes in today for post op appointment. Patient states that she is doing great.

## 2018-07-07 ENCOUNTER — Other Ambulatory Visit: Payer: Self-pay

## 2018-07-07 ENCOUNTER — Encounter: Payer: Self-pay | Admitting: Family Medicine

## 2018-07-07 MED ORDER — PHENTERMINE HCL 15 MG PO CAPS: 15.0000 mg | ORAL_CAPSULE | ORAL | 2 refills | Status: DC

## 2018-07-07 MED ORDER — ESCITALOPRAM OXALATE 20 MG PO TABS: 20.0000 mg | ORAL_TABLET | Freq: Every day | ORAL | 3 refills | Status: DC

## 2018-07-07 MED FILL — ESCITALOPRAM 20 MG TABLET: 20 | 90 days supply | Qty: 90 | Fill #0

## 2018-07-07 MED FILL — PHENTERMINE 15 MG CAPSULE: 15 | 30 days supply | Qty: 30 | Fill #0

## 2018-08-04 ENCOUNTER — Encounter: Payer: 59 | Admitting: Obstetrics and Gynecology

## 2018-08-30 ENCOUNTER — Ambulatory Visit (INDEPENDENT_AMBULATORY_CARE_PROVIDER_SITE_OTHER): Payer: 59 | Admitting: Family Medicine

## 2018-08-30 ENCOUNTER — Encounter: Payer: Self-pay | Admitting: Family Medicine

## 2018-08-30 DIAGNOSIS — R52 Pain, unspecified: Secondary | ICD-10-CM

## 2018-08-30 NOTE — Progress Notes (Signed)
   Chief Complaint:  Tammy Knight is a 28 y.o. female who presents today for a virtual office visit with a chief complaint of body aches.   Assessment/Plan:  Body Aches Likely viral infection. Does not have any red flags. Advised her to call health at work to discuss testing options for [place]. Anticipate that she will continue to have improvement in symptoms over the coming days. Recommended good oral hydration and OTC analgesics as needed. Discussed reasons to return to care and seek emergent care.     Subjective:  HPI:  Body Aches  Symptoms started yesterday after lunch. Started having some chest tightness and heaviness in her chest. Associated symptoms include subjective fevers, headache, malaise, and chills. Some sore throat. No cough or shortness of breath. Symptoms are stable this morning. She stayed out of work today. No known sick contacts. 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   ROS: Per HPI  PMH: She reports that she quit smoking about 7 months ago. Her smoking use included cigarettes. She quit after 5.00 years of use. She has never used smokeless tobacco. She reports current alcohol use of about 5.0 standard drinks of alcohol per week. She reports that she does not use drugs.      Objective/Observations  Physical Exam: Gen: NAD, resting comfortably Pulm: Normal work of breathing Neuro: Grossly normal, moves all extremities Psych: Normal affect and thought content  Virtual Visit via Video   I connected with Tammy Knight on 08/30/18 at  1:00 PM EDT by a video enabled telemedicine application and verified that I am speaking with the correct person using two identifiers. I discussed the limitations of evaluation and management by telemedicine and the availability of in person appointments. The patient expressed understanding and agreed to proceed.   Patient location: Home Provider location: [place] participating in the virtual visit: Myself and Patient     Tammy Knight. Tammy Pain, MD 08/30/2018 12:59 PM

## 2018-11-03 ENCOUNTER — Encounter: Payer: Self-pay | Admitting: Family Medicine

## 2019-01-20 DIAGNOSIS — Z20828 Contact with and (suspected) exposure to other viral communicable diseases: Secondary | ICD-10-CM | POA: Diagnosis not present

## 2019-01-23 ENCOUNTER — Encounter: Payer: Self-pay | Admitting: Family Medicine

## 2019-01-24 ENCOUNTER — Encounter: Payer: Self-pay | Admitting: Family Medicine

## 2019-01-24 NOTE — Telephone Encounter (Signed)
Spoke with patient concerning if Covid test was negative or positive. Informed patient test was positive.

## 2019-02-03 ENCOUNTER — Encounter: Payer: Self-pay | Admitting: Family Medicine

## 2019-02-03 MED ORDER — CLONAZEPAM 0.5 MG PO TABS: 0.5000 mg | ORAL_TABLET | Freq: Two times a day (BID) | ORAL | 1 refills | Status: DC | PRN

## 2019-02-03 MED ORDER — ESCITALOPRAM OXALATE 20 MG PO TABS: 20.0000 mg | ORAL_TABLET | Freq: Every day | ORAL | 3 refills | Status: DC

## 2019-02-03 NOTE — Telephone Encounter (Signed)
Please advise 

## 2019-03-30 ENCOUNTER — Other Ambulatory Visit: Payer: Self-pay

## 2019-03-30 ENCOUNTER — Encounter (HOSPITAL_COMMUNITY): Payer: Self-pay | Admitting: Emergency Medicine

## 2019-03-30 ENCOUNTER — Emergency Department (HOSPITAL_COMMUNITY)
Admission: EM | Admit: 2019-03-30 | Discharge: 2019-03-31 | Disposition: A | Discharge status: Other Acute Inpatient Hospital | Payer: 59 | Attending: Emergency Medicine | Admitting: Emergency Medicine

## 2019-03-30 ENCOUNTER — Ambulatory Visit: Payer: Self-pay | Admitting: *Deleted

## 2019-03-30 DIAGNOSIS — Z79899 Other long term (current) drug therapy: Secondary | ICD-10-CM | POA: Insufficient documentation

## 2019-03-30 DIAGNOSIS — F332 Major depressive disorder, recurrent severe without psychotic features: Secondary | ICD-10-CM | POA: Insufficient documentation

## 2019-03-30 DIAGNOSIS — J45909 Unspecified asthma, uncomplicated: Secondary | ICD-10-CM | POA: Insufficient documentation

## 2019-03-30 DIAGNOSIS — F32A Depression, unspecified: Secondary | ICD-10-CM

## 2019-03-30 DIAGNOSIS — R45851 Suicidal ideations: Secondary | ICD-10-CM | POA: Insufficient documentation

## 2019-03-30 DIAGNOSIS — Z20828 Contact with and (suspected) exposure to other viral communicable diseases: Secondary | ICD-10-CM | POA: Insufficient documentation

## 2019-03-30 DIAGNOSIS — Z87891 Personal history of nicotine dependence: Secondary | ICD-10-CM | POA: Insufficient documentation

## 2019-03-30 DIAGNOSIS — F329 Major depressive disorder, single episode, unspecified: Secondary | ICD-10-CM

## 2019-03-30 LAB — HCG, QUANTITATIVE, PREGNANCY: hCG, Beta Chain, Quant, S: 5417 m[IU]/mL — ABNORMAL HIGH (ref <5)

## 2019-03-30 LAB — CBC
HCT: 37.8 % (ref 36.0–46.0)
Hemoglobin: 12.5 g/dL (ref 12.0–15.0)
MCH: 32 pg (ref 26.0–34.0)
MCHC: 33.1 g/dL (ref 30.0–36.0)
MCV: 96.7 fL (ref 80.0–100.0)
Platelets: 258 10*3/uL (ref 150–400)
RBC: 3.91 MIL/uL (ref 3.87–5.11)
RDW: 12.5 % (ref 11.5–15.5)
WBC: 6.4 10*3/uL (ref 4.0–10.5)
nRBC: 0 % (ref 0.0–0.2)

## 2019-03-30 LAB — COMPREHENSIVE METABOLIC PANEL
ALT: 15 U/L (ref 0–44)
AST: 18 U/L (ref 15–41)
Albumin: 4 g/dL (ref 3.5–5.0)
Alkaline Phosphatase: 39 U/L (ref 38–126)
Anion gap: 5 (ref 5–15)
BUN: 10 mg/dL (ref 6–20)
CO2: 26 mmol/L (ref 22–32)
Calcium: 8.8 mg/dL — ABNORMAL LOW (ref 8.9–10.3)
Chloride: 108 mmol/L (ref 98–111)
Creatinine, Ser: 0.52 mg/dL (ref 0.44–1.00)
GFR calc Af Amer: >60 mL/min (ref >60)
GFR calc non Af Amer: >60 mL/min (ref >60)
Glucose, Bld: 99 mg/dL (ref 70–99)
Potassium: 3.6 mmol/L (ref 3.5–5.1)
Sodium: 139 mmol/L (ref 135–145)
Total Bilirubin: 0.8 mg/dL (ref 0.3–1.2)
Total Protein: 6.4 g/dL — ABNORMAL LOW (ref 6.5–8.1)

## 2019-03-30 LAB — ACETAMINOPHEN LEVEL: Acetaminophen (Tylenol), Serum: <10 ug/mL — ABNORMAL LOW (ref 10–30)

## 2019-03-30 LAB — I-STAT BETA HCG BLOOD, ED (MC, WL, AP ONLY): I-stat hCG, quantitative: >2000 m[IU]/mL — ABNORMAL HIGH (ref <5)

## 2019-03-30 LAB — ETHANOL: Alcohol, Ethyl (B): <10 mg/dL (ref <10)

## 2019-03-30 LAB — SALICYLATE LEVEL: Salicylate Lvl: <7 mg/dL (ref 2.8–30.0)

## 2019-03-30 LAB — RAPID URINE DRUG SCREEN, HOSP PERFORMED
Amphetamines: NOT DETECTED
Barbiturates: NOT DETECTED
Benzodiazepines: NOT DETECTED
Cocaine: NOT DETECTED
Opiates: NOT DETECTED
Tetrahydrocannabinol: NOT DETECTED

## 2019-03-30 NOTE — BH Assessment (Signed)
Assessment Note  Tammy Knight is an 28 y.o. female presenting voluntarily to [facility] ED complaining of increased depression and suicidal ideations. Patient reports onset of SI 2 days ago after getting into an argument with her wedding photographer that made her feel worthless. Patient reports she is supposed to get married in her home on Saturday. She states she took her fiances gun and sat in the shower thinking of using it. Patient states since then she has researched life insurance policies and different ways to overdose. Patient denies HI/AVH. Patient reports she went to [facility] last Spring and was prescribed Citalopram, which she takes but does not know if it is effective. She reports taking Klonopin PRN. She reports drinking 2-3 glasses of wine 2-3 nights per week to cope with her anxiety and depression. She denies any other substance use. She denies any history of trauma or criminal charges. Additionally, patient reports she found out she was pregnant today after taking an at home pregnancy test.   Patient is alert and oriented x 4. She is dressed in scrubs. Speech is logical, eye contact is good, and thoughts are organized. Her mood is depressed and her affect is congruent. Her insight, judgement, and impulse control are impaired. Patient does not appear to be responding to internal stimuli or experiencing delusional thought content.  Diagnosis: F33.2 MDD, recurrent, severe  Past Medical History:  Past Medical History:  Diagnosis Date  . Asthma    h/o no inhalers  . Chlamydia   . Depression   . Hay fever   . UTI (urinary tract infection)     Past Surgical History:  Procedure Laterality Date  . ABDOMINOPLASTY  2014  . LAPAROSCOPIC SALPINGO OOPHERECTOMY Right 06/13/2018   Procedure: LAPAROSCOPIC RIGHT  OOPHORECTOMY;  Surgeon: Harlin Heys, MD;  Location: [facility] ORS;  Service: Gynecology;  Laterality: Right;  . TONSILLECTOMY      Family History:  Family History  Problem  Relation Age of Onset  . Heart attack Father        2014  . Alcohol abuse Father   . Depression Father   . Hyperlipidemia Father   . Hypertension Father   . COPD Mother   . Glaucoma Mother   . Arthritis Mother   . Depression Mother   . Depression Sister   . Hearing loss Sister   . Diabetes Paternal Grandfather     Social History:  reports that she quit smoking about 14 months ago. Her smoking use included cigarettes. She quit after 5.00 years of use. She has never used smokeless tobacco. She reports current alcohol use of about 5.0 standard drinks of alcohol per week. She reports that she does not use drugs.  Additional Social History:  Alcohol / Drug Use Pain Medications: see MAR Prescriptions: see MAR Over the Counter: see MAR History of alcohol / drug use?: No history of alcohol / drug abuse  CIWA: CIWA-Ar BP: 120/78 Pulse Rate: 91 COWS:    Allergies:  Allergies  Allergen Reactions  . Penicillins Anaphylaxis    Did it involve swelling of the face/tongue/throat, SOB, or low BP? Unknown Did it involve sudden or severe rash/hives, skin peeling, or any reaction on the inside of your mouth or nose? Unknown Did you need to seek medical attention at a hospital or doctor's office? Unknown When did it last happen?childhood allergy If all above answers are "NO", may proceed with cephalosporin use.   . Bee Venom Swelling    Home Medications: (Not in  a hospital admission)   OB/GYN Status:  Patient's last menstrual period was 02/24/2019.  General Assessment Data Location of Assessment: [facility] TTS Assessment: In system Is this a Tele or Face-to-Face Assessment?: Face-to-Face Is this an Initial Assessment or a Re-assessment for this encounter?: Initial Assessment Patient Accompanied by:: N/A Language Other than English: No Living Arrangements: (her home) What gender do you identify as?: Female Marital status: Long term relationship Maiden name: Sullenberger Pregnancy  Status: Yes (Comment: include estimated delivery date)(tested positive today with home pregnancy test) Living Arrangements: Spouse/significant other Can pt return to current living arrangement?: Yes Admission Status: Voluntary Is patient capable of signing voluntary admission?: Yes Referral Source: Self/Family/Friend Insurance type: [place] Living Arrangements: Spouse/significant other Legal Guardian: (self) Name of Psychiatrist: Warden/ranger Name of Therapist: none  Education Status Is patient currently in school?: No Is the patient employed, unemployed or receiving disability?: Employed  Risk to self with the past 6 months Suicidal Ideation: Yes-Currently Present Has patient been a risk to self within the past 6 months prior to admission? : Yes Suicidal Intent: Yes-Currently Present Has patient had any suicidal intent within the past 6 months prior to admission? : Yes Is patient at risk for suicide?: Yes Suicidal Plan?: Yes-Currently Present Has patient had any suicidal plan within the past 6 months prior to admission? : Yes Specify Current Suicidal Plan: shoot herself or overdose Access to Means: Yes Specify Access to Suicidal Means: fiance owns gun What has been your use of drugs/alcohol within the last 12 months?: alcohol 2-3 x weekly Previous Attempts/Gestures: No How many times?: 0 Other Self Harm Risks: none Triggers for Past Attempts: None known Intentional Self Injurious Behavior: None Family Suicide History: Yes(uncle) Recent stressful life event(s): Other (Comment)(planning wedding in covid; pregnant) Persecutory voices/beliefs?: No Depression: Yes Depression Symptoms: Despondent, Insomnia, Tearfulness, Isolating, Fatigue, Loss of interest in usual pleasures, Guilt, Feeling angry/irritable, Feeling worthless/self pity Substance abuse history and/or treatment for substance abuse?: No Suicide prevention information given to non-admitted patients: Not  applicable  Risk to Others within the past 6 months Homicidal Ideation: No Does patient have any lifetime risk of violence toward others beyond the six months prior to admission? : No Thoughts of Harm to Others: No Current Homicidal Intent: No Current Homicidal Plan: No Access to Homicidal Means: No Identified Victim: none History of harm to others?: No Assessment of Violence: None Noted Violent Behavior Description: none Does patient have access to weapons?: Yes (Comment)(fiance owns gun) Criminal Charges Pending?: No Does patient have a court date: No Is patient on probation?: No  Psychosis Hallucinations: None noted Delusions: None noted  Mental Status Report Appearance/Hygiene: In scrubs Eye Contact: Fair Motor Activity: Freedom of movement Speech: Logical/coherent Level of Consciousness: Alert Mood: Depressed Affect: Depressed Anxiety Level: Moderate Thought Processes: Coherent, Relevant Judgement: Impaired Orientation: Person, Place, Time, Situation Obsessive Compulsive Thoughts/Behaviors: None  Cognitive Functioning Concentration: Normal Memory: Recent Intact, Remote Intact Is patient IDD: No Insight: Fair Impulse Control: Fair Appetite: Good Have you had any weight changes? : No Change Sleep: Decreased Total Hours of Sleep: 5 Vegetative Symptoms: None  ADLScreening [facility] Assessment Services) Patient's cognitive ability adequate to safely complete daily activities?: Yes Patient able to express need for assistance with ADLs?: Yes Independently performs ADLs?: Yes (appropriate for developmental age)  Prior Inpatient Therapy Prior Inpatient Therapy: No  Prior Outpatient Therapy Prior Outpatient Therapy: Yes Prior Therapy Dates: ongoing Prior Therapy Facilty/Provider(s): [facility] Reason for Treatment: depression Does  patient have an ACCT team?: No Does patient have Intensive In-House Services?  : No Does patient have [facility] services? : Yes Does  patient have P4CC services?: No  ADL Screening (condition at time of admission) Patient's cognitive ability adequate to safely complete daily activities?: Yes Is the patient deaf or have difficulty hearing?: No Does the patient have difficulty seeing, even when wearing glasses/contacts?: No Does the patient have difficulty concentrating, remembering, or making decisions?: No Patient able to express need for assistance with ADLs?: Yes Does the patient have difficulty dressing or bathing?: No Independently performs ADLs?: Yes (appropriate for developmental age) Does the patient have difficulty walking or climbing stairs?: No Weakness of Legs: None Weakness of Arms/Hands: None  Home Assistive Devices/Equipment Home Assistive Devices/Equipment: None  Therapy Consults (therapy consults require a physician order) PT Evaluation Needed: No OT Evalulation Needed: No SLP Evaluation Needed: No Abuse/Neglect Assessment (Assessment to be complete while patient is alone) Abuse/Neglect Assessment Can Be Completed: Yes Physical Abuse: Denies Verbal Abuse: Denies Sexual Abuse: Denies Exploitation of patient/patient's resources: Denies Self-Neglect: Denies Values / Beliefs Cultural Requests During Hospitalization: None Spiritual Requests During Hospitalization: None Consults Spiritual Care Consult Needed: No Transition of Care Team Consult Needed: No Advance Directives (For Healthcare) Does Patient Have a Medical Advance Directive?: No Would patient like information on creating a medical advance directive?: No - Patient declined          Disposition: Anette Riedel, NP and Dr. Mallie Darting recommend in patient treatment. TTS to seek placement. Disposition Initial Assessment Completed for this Encounter: Yes  On Site Evaluation by:   Reviewed with Physician:    Orvis Brill 03/30/2019 3:02 PM

## 2019-03-30 NOTE — Telephone Encounter (Signed)
See note

## 2019-03-30 NOTE — BH Assessment (Addendum)
Per Vicente Males at [facility]. Patient accepted to their facility pending a negative COVID test and IVC. Patient however is getting married Saturday and would like to be closer to [place]. No [facility] beds available, per [facility]. Counselor contacted [facility] to inquire about their bed availability. No beds at their facility.  IVC may need to be considered so that patient is transferred to [facility] safely. Discussed with [facility] and she agreed a need for an IVC to insure that patient is safely transported.   Counselor discussed need for an IVC with patient's nurse and charge nurse at [facility]. Patient therefore will be IVC'd. The paperwork will need to be faxed to [facility] after served to patient at [facility].   [facility]'s fax number is [phone] and patient's nurse agreed to fax documents. The contact information for [facility] is [phone].

## 2019-03-30 NOTE — Telephone Encounter (Signed)
Noted. Agree with Dispo 

## 2019-03-30 NOTE — Telephone Encounter (Signed)
Pt called stating she had had suicidal thoughts. She does not have a plan. but she did have a plan on Tuesday but decided not to do it. She had a gun on that day. She decide that she could not do it. She is depressed. She has started a new job, and planning a wedding. Was upset with the photographer. The pictures she felt did not come out right and that photographer did not offer to correct them. And then having to get things set up for Saturday, the wedding is planned for Saturday. Not being able to get people to do the things they had promised her they could do.  All of this was getting to her. And today she found out that she is pregnant. Feeling like she could not deal with all of it.   Advised her that she needs to talk to her mom and also to a Medical illustrator. She did speak with a friend's son's counselor last night. She is advised to get her mom to drive her to [facility] ED to speak with a mental health provider right now. She stated that she would go. Advised her that her pcp would be notified and she voiced understanding. [facility] [place] notified. Routing to the practice for review. Reason for Disposition . [1] Depression AND [2] unable to do any of normal activities (e.g., self care, school, work; in comparison to baseline).  Answer Assessment - Initial Assessment Questions 1. CONCERN: "What happened that made you call today?"     Pt stated she got bad news today but did not state what it was 2. DEPRESSION SYMPTOM SCREENING: "How are you feeling overall?" (e.g., decreased energy, increased sleeping or difficulty sleeping, difficulty concentrating, feelings of sadness, guilt, hopelessness, or worthlessness)     Depressed, difficulty sleeping, sadness, worthlessness 3. RISK OF HARM - SUICIDAL IDEATION:  "Do you ever have thoughts of hurting or killing yourself?"  (e.g., yes, no, no but preoccupation with thoughts about death)   - INTENT:  "Do you have thoughts of hurting or killing  yourself right NOW?" (e.g., yes, no, N/A)   - PLAN: "Do you have a specific plan for how you would do this?" (e.g., gun, knife, overdose, no plan, N/A)     Yes on Tuesday night she was with her fiancee and went to the bathroom with his gun, but got in the shower and cried. Did not try to go through with it. 4. RISK OF HARM - HOMICIDAL IDEATION:  "Do you ever have thoughts of hurting or killing someone else?"  (e.g., yes, no, no but preoccupation with thoughts about death)   - INTENT:  "Do you have thoughts of hurting or killing someone right NOW?" (e.g., yes, no, N/A)   - PLAN: "Do you have a specific plan for how you would do this?" (e.g., gun, knife, no plan, N/A)      no 5. FUNCTIONAL IMPAIRMENT: "How have things been going for you overall in your life? Have you had any more difficulties than usual doing your normal daily activities?"  (e.g., better, same, worse; self-care, school, work, Tree surgeon)     Started a new job, planning a wedding and things not going the right way 6. SUPPORT: "Who is with you now?" "Who do you live with?" "Do you have family or friends nearby who you can talk to?"      Her mom and she can talk with her 7. THERAPIST: "Do you have a counselor or therapist? Name?"  Yes she has one but she is not satisfied with the one she has, she feels like she is not listening 8. STRESSORS: "Has there been any new stress or recent changes in your life?"     Yes, starting a new job, planning a wedding and bad news today 60. DRUG ABUSE/ALCOHOL: "Do you drink alcohol or use any illegal drugs?"      Drink alcohol, no drugs 10. OTHER: "Do you have any other health or medical symptoms right now?" (e.g., fever)       No fever, has hx of asthma 11. PREGNANCY: "Is there any chance you are pregnant?" "When was your last menstrual period?"       Yes just found out today. That was her disturbing news today.  Protocols used: [place]

## 2019-03-30 NOTE — [facility] Counselor (Signed)
Disposition: Anette Riedel, NP and Dr. Mallie Darting recommend in patient treatment. TTS to seek placement. Providers recommend a quantitative pregnancy test to see how far along patient is.

## 2019-03-30 NOTE — Telephone Encounter (Signed)
FYI

## 2019-03-30 NOTE — ED Notes (Signed)
Pt has one belonging bag in triage nurse's station cabinet

## 2019-03-30 NOTE — ED Notes (Signed)
The mother of the patient left her phone number [phone], Georgana Curio. She said to call her for a ride home for the patient.

## 2019-03-30 NOTE — ED Triage Notes (Signed)
Pt coming in for SI that started Tuesday while in [place] doing wedding pictures with her fiance and they didn't turn out like she hoped. States while they were in the bed trying to sleep she started crying and got her fiance's gun and when into the bathroom and got into the shower, but was able to talk herself out of hurting herself. Patient's wedding is this weekend and is stressed, just started a new job that is stressful and just found out today that she is pregnant. Pt reports that she has Googled on the Internet ways to overdose on pills.  Pt reports that she stopped taking Citalopram about a month ago bc she didn't like the way she would feel when the medication wore off.

## 2019-03-30 NOTE — ED Provider Notes (Signed)
[facility] DEPT Provider Note   CSN: [id number] Arrival date & time: 03/30/19  1215     History Chief Complaint  Patient presents with  . Suicidal    Tammy Knight is a 28 y.o. female with past medical history significant for asthma, depression, anxiety presents to emergency department today with chief complaint of suicidal ideations.  Patient not taking any medications for depression anxiety.  She used to take citalopram but stopped taking it a month ago as she did not like the way it made her feel.  She is reporting increasing stress as she recently started a new job and found out that she was pregnant today. She states 1 week ago her depression significantly worsened.  She is planning a wedding right now and feeling very stressed. She reports taking her fiancs gun while he was sleeping and sitting in the shower thinking about killing herself.  She was able to talk herself out of her results at that time.  She states she has also been googling ways on the Internet to overdose on pills.    She denies any homicidal thoughts, visual or auditory hallucinations. She denies any sick contacts.  She denies fever, chills, chest pain, abdominal pain, pelvic pain, vaginal bleeding, vaginal discharge, back pain, urinary frequency or dysuria.  Denies any drug use.  She did drink wine last weekend because she did not yet know that she was pregnant.   Past Medical History:  Diagnosis Date  . Asthma    h/o no inhalers  . Chlamydia   . Depression   . Hay fever   . UTI (urinary tract infection)     Patient Active Problem List   Diagnosis Date Noted  . BMI 32.0-32.9,adult 06/09/2018  . Depression, major, single episode, in partial remission ([place]) 06/09/2018  . PMS (premenstrual syndrome) 12/13/2017  . Anxiety disorder 08/13/2017    Past Surgical History:  Procedure Laterality Date  . ABDOMINOPLASTY  2014  . LAPAROSCOPIC SALPINGO OOPHERECTOMY Right  06/13/2018   Procedure: LAPAROSCOPIC RIGHT  OOPHORECTOMY;  Surgeon: Harlin Heys, MD;  Location: [facility] ORS;  Service: Gynecology;  Laterality: Right;  . TONSILLECTOMY       OB History    Gravida  1   Para  0   Term  0   Preterm  0   AB  0   Living  0     SAB  0   TAB  0   Ectopic  0   Multiple  0   Live Births              Family History  Problem Relation Age of Onset  . Heart attack Father        2014  . Alcohol abuse Father   . Depression Father   . Hyperlipidemia Father   . Hypertension Father   . COPD Mother   . Glaucoma Mother   . Arthritis Mother   . Depression Mother   . Depression Sister   . Hearing loss Sister   . Diabetes Paternal Grandfather     Social History   Tobacco Use  . Smoking status: Former Smoker    Years: 5.00    Types: Cigarettes    Quit date: 01/07/2018    Years since quitting: 1.2  . Smokeless tobacco: Never Used  . Tobacco comment: 1-2 cig per day  Substance Use Topics  . Alcohol use: Yes    Alcohol/week: 5.0 standard drinks  Types: 5 Glasses of wine per week    Comment: Occasional  . Drug use: No    Home Medications Prior to Admission medications   Medication Sig Start Date End Date Taking? Authorizing Provider  clonazePAM (KLONOPIN) 0.5 MG tablet Take 1 tablet (0.5 mg total) by mouth 2 (two) times daily as needed for anxiety. 02/03/19  Yes Vivi Barrack, MD  Multiple Vitamin (STRESS B PO) Take 2 tablets by mouth daily.   Yes [provider]  escitalopram (LEXAPRO) 20 MG tablet Take 1 tablet (20 mg total) by mouth daily. Patient not taking: Reported on 03/30/2019 02/03/19   Vivi Barrack, MD  phentermine 15 MG capsule Take 1 capsule (15 mg total) by mouth every morning. Patient not taking: Reported on 03/30/2019 07/07/18   Vivi Barrack, MD    Allergies    Penicillins and Bee venom  Review of Systems   Review of Systems  Constitutional: Negative for chills and fever.  HENT: Negative for  congestion, ear discharge, ear pain, sinus pressure, sinus pain and sore throat.   Eyes: Negative for pain and redness.  Respiratory: Negative for cough and shortness of breath.   Cardiovascular: Negative for chest pain.  Gastrointestinal: Negative for abdominal pain, constipation, diarrhea, nausea and vomiting.  Genitourinary: Negative for dysuria and hematuria.  Musculoskeletal: Negative for back pain and neck pain.  Skin: Negative for wound.  Neurological: Negative for weakness, numbness and headaches.  Psychiatric/Behavioral: Positive for suicidal ideas. The patient is nervous/anxious.     Physical Exam Updated Vital Signs BP 120/78 (BP Location: Left Arm)   Pulse 91   Temp 98.9 F (37.2 C) (Oral)   Resp 16   LMP 02/24/2019   SpO2 100%   Physical Exam Vitals and nursing note reviewed.  Constitutional:      General: She is not in acute distress.    Appearance: She is not ill-appearing.  HENT:     Head: Normocephalic and atraumatic.     Right Ear: Tympanic membrane and external ear normal.     Left Ear: Tympanic membrane and external ear normal.     Nose: Nose normal.     Mouth/Throat:     Mouth: Mucous membranes are moist.     Pharynx: Oropharynx is clear.  Eyes:     General: No scleral icterus.       Right eye: No discharge.        Left eye: No discharge.     Extraocular Movements: Extraocular movements intact.     Conjunctiva/sclera: Conjunctivae normal.     Pupils: Pupils are equal, round, and reactive to light.  Neck:     Vascular: No JVD.  Cardiovascular:     Rate and Rhythm: Normal rate and regular rhythm.     Pulses: Normal pulses.          Radial pulses are 2+ on the right side and 2+ on the left side.     Heart sounds: Normal heart sounds.  Pulmonary:     Comments: Lungs clear to auscultation in all fields. Symmetric chest rise. No wheezing, rales, or rhonchi. Abdominal:     Tenderness: There is no right CVA tenderness or left CVA tenderness.      Comments: Abdomen is soft, non-distended, and non-tender in all quadrants. No rigidity, no guarding. No peritoneal signs.  Musculoskeletal:        General: Normal range of motion.     Cervical back: Normal range of motion.  Skin:  General: Skin is warm and dry.     Capillary Refill: Capillary refill takes less than 2 seconds.  Neurological:     Mental Status: She is oriented to person, place, and time.     GCS: GCS eye subscore is 4. GCS verbal subscore is 5. GCS motor subscore is 6.     Comments: Fluent speech, no facial droop.  Psychiatric:        Mood and Affect: Affect is tearful.        Behavior: Behavior normal.        Thought Content: Thought content includes suicidal ideation. Thought content does not include homicidal ideation. Thought content includes suicidal plan. Thought content does not include homicidal plan.     ED Results / Procedures / Treatments   Labs (all labs ordered are listed, but only abnormal results are displayed) Labs Reviewed  COMPREHENSIVE METABOLIC PANEL - Abnormal; Notable for the following components:      Result Value   Calcium 8.8 (*)    Total Protein 6.4 (*)    All other components within normal limits  ACETAMINOPHEN LEVEL - Abnormal; Notable for the following components:   Acetaminophen (Tylenol), Serum <10 (*)    All other components within normal limits  I-STAT BETA HCG BLOOD, ED ([facility], [facility], [facility] ONLY) - Abnormal; Notable for the following components:   I-stat hCG, quantitative >2,000.0 (*)    All other components within normal limits  SARS CORONAVIRUS 2 (TAT 6-24 HRS)  ETHANOL  SALICYLATE LEVEL  CBC  RAPID URINE DRUG SCREEN, HOSP PERFORMED  HCG, QUANTITATIVE, PREGNANCY    EKG None  Radiology No results found.  Procedures Procedures (including critical care time)  Medications Ordered in ED Medications - No data to display  ED Course  I have reviewed the triage vital signs and the nursing notes.  Pertinent labs & imaging  results that were available during my care of the patient were reviewed by me and considered in my medical decision making (see chart for details).    MDM Rules/Calculators/A&P     CHA2DS2/VAS Stroke Risk Points      N/A >= 2 Points: High Risk  1 - 1.99 Points: Medium Risk  0 Points: Low Risk    A final score could not be computed because of missing components.: Last  Change: N/A   This score determines the patient's risk of having a stroke if the  patient has atrial fibrillation.   This score is not applicable to this patient. Components are not  calculated.   No medical complaints today.  Labs ordered and reviewed. They are unremarkable. Beta hCG is >2k consistent with recent positive home pregnancy test.  On exam she has no abdominal pain and is denying any pelvic pain, vaginal bleeding, vaginal discharge, urinary symptoms.  At this time is not felt that she needs an ultrasound.  She is stable to follow-up outpatient with OB/GYN and establish prenatal care.  Ptis medically cleared for TTS evaluation. Per [facility] note: Anette Riedel, NP and Dr. Mallie Darting recommend in patient treatment. TTS to seek placement.  Behavioral health also recommended hCG quantitative be drawn to see how far along patient is in pregnancy.  Lab ordered with [facility] to follow-up on result.  Patient is stable at time of disposition.    Portions of this note were generated with Lobbyist. Dictation errors may occur despite best attempts at proofreading.      Final Clinical Impression(s) / ED Diagnoses Final diagnoses:  Suicidal  ideation  Depression, unspecified depression type    Rx / DC Orders ED Discharge Orders    None       Cherre Robins, PA-C 03/30/19 [place], [place], DO 04/01/19 2259

## 2019-03-30 NOTE — ED Notes (Addendum)
Pt states " she wanted to leave" provider in room conceived her to stay.  * after conversation and assessment interview  Please note the following: 1. 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 2. 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 Fiance carries a gun with him all the times even on vacation  ( see ed note)  4. Pt stop taking her medications for diagnosed depression a month ago, " states a pill is not going to fix this"  5. Pt is upset, sad and expressing frustration during interview  6. Pt states she feels like she burden on her parents and they can't help her pay for the wedding.  7. See labs

## 2019-03-31 ENCOUNTER — Encounter: Payer: Self-pay | Admitting: Psychiatric/Mental Health

## 2019-03-31 ENCOUNTER — Inpatient Hospital Stay
Admission: AD | Admit: 2019-03-31 | Discharge: 2019-03-31 | DRG: 885 | Disposition: A | Discharge status: Home / Self Care | Payer: Self-pay | Source: Intra-hospital | Attending: Psychiatry | Admitting: Psychiatry

## 2019-03-31 ENCOUNTER — Other Ambulatory Visit: Payer: Self-pay

## 2019-03-31 DIAGNOSIS — F332 Major depressive disorder, recurrent severe without psychotic features: Principal | ICD-10-CM

## 2019-03-31 DIAGNOSIS — Z3490 Encounter for supervision of normal pregnancy, unspecified, unspecified trimester: Secondary | ICD-10-CM

## 2019-03-31 DIAGNOSIS — Z87891 Personal history of nicotine dependence: Secondary | ICD-10-CM

## 2019-03-31 DIAGNOSIS — G4700 Insomnia, unspecified: Secondary | ICD-10-CM | POA: Diagnosis present

## 2019-03-31 DIAGNOSIS — R45851 Suicidal ideations: Secondary | ICD-10-CM | POA: Diagnosis present

## 2019-03-31 DIAGNOSIS — F329 Major depressive disorder, single episode, unspecified: Secondary | ICD-10-CM | POA: Insufficient documentation

## 2019-03-31 LAB — SARS CORONAVIRUS 2 (TAT 6-24 HRS): SARS Coronavirus 2: NEGATIVE

## 2019-03-31 MED ORDER — QUETIAPINE FUMARATE 25 MG PO TABS: 25.0000 mg | ORAL_TABLET | Freq: Every day | ORAL | 1 refills | Status: DC

## 2019-03-31 MED ORDER — FOLIC ACID 1 MG PO TABS: 1.0000 mg | ORAL_TABLET | Freq: Every day | ORAL | Status: DC

## 2019-03-31 MED ORDER — ACETAMINOPHEN 325 MG PO TABS: 650.0000 mg | ORAL_TABLET | Freq: Four times a day (QID) | ORAL | Status: DC | PRN

## 2019-03-31 MED ORDER — QUETIAPINE FUMARATE 25 MG PO TABS: 25.0000 mg | ORAL_TABLET | Freq: Every day | ORAL | Status: DC

## 2019-03-31 MED ORDER — FOLIC ACID 1 MG PO TABS: 1.0000 mg | ORAL_TABLET | Freq: Every day | ORAL | 1 refills | Status: DC

## 2019-03-31 MED ORDER — ESCITALOPRAM OXALATE 20 MG PO TABS: 20.0000 mg | ORAL_TABLET | Freq: Every day | ORAL | 1 refills | Status: DC

## 2019-03-31 MED ORDER — SERTRALINE HCL 25 MG PO TABS
25.0000 mg | ORAL_TABLET | Freq: Every day | ORAL | Status: DC
  Filled 2019-03-31: qty 1

## 2019-03-31 MED ORDER — ESCITALOPRAM OXALATE 10 MG PO TABS: 20.0000 mg | ORAL_TABLET | Freq: Every day | ORAL | Status: DC

## 2019-03-31 NOTE — ED Notes (Signed)
Reported called to [facility] at [facility]. [employer] notified also.

## 2019-03-31 NOTE — BHH Suicide Risk Assessment (Signed)
[facility] Discharge Suicide Risk Assessment   Principal Problem: Severe recurrent major depression without psychotic features [facility]) Discharge Diagnoses: Principal Problem:   Severe recurrent major depression without psychotic features ([place]) Active Problems:   Pregnancy   Total Time spent with patient: 1 hour  Musculoskeletal: Strength & Muscle Tone: within normal limits Gait & Station: normal Patient leans: N/A  Psychiatric Specialty Exam: Review of Systems  Constitutional: Negative.   HENT: Negative.   Eyes: Negative.   Respiratory: Negative.   Cardiovascular: Negative.   Gastrointestinal: Negative.   Musculoskeletal: Negative.   Skin: Negative.   Neurological: Negative.   Psychiatric/Behavioral: Positive for dysphoric mood and sleep disturbance. Negative for agitation, behavioral problems, confusion, decreased concentration, hallucinations, self-injury and suicidal ideas. The patient is nervous/anxious. The patient is not hyperactive.     Blood pressure 127/74, pulse 87, temperature 98.3 F (36.8 C), temperature source Oral, resp. rate 18, height 5\' 5"  (1.651 m), weight 89.8 kg, last menstrual period 02/24/2019, SpO2 100 %.Body mass index is 32.95 kg/m.  General Appearance: Casual  Eye Contact::  Good  Speech:  Clear and [id number]  Volume:  Normal  Mood:  Dysphoric  Affect:  Congruent  Thought Process:  Coherent  Orientation:  Full (Time, Place, and Person)  Thought Content:  Logical  Suicidal Thoughts:  No  Homicidal Thoughts:  No  Memory:  Immediate;   Fair Recent;   Fair Remote;   Fair  Judgement:  Fair  Insight:  Fair  Psychomotor Activity:  Normal  Concentration:  Fair  Recall:  [employer] [place]  Language: Fair  Akathisia:  No  Handed:  Right  AIMS (if indicated):     Assets:  Communication Skills Desire for Improvement Housing Physical Health Resilience Social Support  Sleep:     Cognition: WNL  ADL's:  Intact   Mental Status Per  Nursing Assessment::   On Admission:  NA  Demographic Factors:  Caucasian  Loss Factors: Financial problems/change in socioeconomic status  Historical Factors: Recent increase in stress related to wedding  Risk Reduction Factors:   Pregnancy, Religious beliefs about death, Living with another person, especially a relative and Positive social support  Continued Clinical Symptoms:  Depression:   Impulsivity  Cognitive Features That Contribute To Risk:  None    Suicide Risk:  Minimal: No identifiable suicidal ideation.  Patients presenting with no risk factors but with morbid ruminations; may be classified as minimal risk based on the severity of the depressive symptoms  Follow-up Information    Patient declined Follow up.   Why: Patient declined referral.  Patient provided list of mental health providers that accept her insurance in the community. Contact information: Patient declined.          Plan Of Care/Follow-up recommendations:  Activity:  Activity as tolerated Diet:  Regular diet Other:  Follow-up with outpatient treatment as recommended.  Alethia Berthold, MD 03/31/2019, 3:44 PM

## 2019-03-31 NOTE — ED Notes (Addendum)
Per conversation with charge pt is not IVC, and to be accepted at [facility]  After 8 am

## 2019-03-31 NOTE — ED Notes (Signed)
Clinician contacted Lynnda Child, RN and Charge Nurse Jenny Reichmann, RN, regarding pt being accepted to [facility]. Mechele Claude stated pt could arrive to BMU any time after 0730 and John expressed an understanding. John stated pt would still be going to the room previously planned.  Room: 319 Accepting: Talbot Grumbling, NP Attending: Dr. Weber Cooks Call to Report: [phone]

## 2019-03-31 NOTE — Plan of Care (Signed)
Newly admitted and appropriate on the unit.

## 2019-03-31 NOTE — Plan of Care (Signed)
  Problem: Group Participation Goal: STG - Patient will engage in groups without prompting or encouragement from LRT x3 group sessions within 5 recreation therapy group sessions Description: STG - Patient will engage in groups without prompting or encouragement from LRT x3 group sessions within 5 recreation therapy group sessions Outcome: Not Applicable

## 2019-03-31 NOTE — Progress Notes (Signed)
Patient ID: Tammy Knight, female   DOB: [birth year], 28 y.o.   MRN: [id number] Patient presents voluntarily secondary to increased depression and anxiety along with suicidal thoughts and plans. Reports that she thought about shooting herself with her fiancee's gun after experiencing a stressful event 2 days ago. Patient reports that she was supposed to get married this Saturday but her photographer messed the event up by informing her and her fiancee that she will not be available to photo shooting. Patient became upset and overwhelmed thinking about all the effort that she put into this wedding preparations, which precipitated her thoughts of self harm. Patient reports no medical history. She just found out that she is pregnant and her fiancee is excited about it.  Patient reports history of depression for which she used to take Celexa but had to stop it a month ago, thinking that she did not need it. No significant family history of mental illness. Patient is employed full time and lives with her fiancee who is very supportive. Patient's parents are supportive as well. Patient is an occasional smoker of cigarette and drinks alcohol a few times per month. Denies use of illicit drugs.  Patient is generally healthy and denies pain. Skin assessment performed: no issues to report. Patient reports that her goal is find a therapist in outpatient setting.  Marland Kitchen

## 2019-03-31 NOTE — BHH Group Notes (Signed)

## 2019-03-31 NOTE — ED Notes (Signed)
Pt has been accepted to [facility] BMU pending a negative COVID test and planned d/c tomorrow (03/31/2019). This information was provided to pt's nurse, Jeannine Boga, at [phone].  Room: 319 Accepting: Talbot Grumbling, NP Attending: Dr. Weber Cooks Call to Report: [phone]

## 2019-03-31 NOTE — Progress Notes (Signed)
Recreation Therapy Notes  INPATIENT RECREATION THERAPY ASSESSMENT  Patient Details Name: Tammy Knight MRN: [id number] DOB: 1990-07-16 Today's Date: 03/31/2019       Information Obtained From: Patient  Able to Participate in Assessment/Interview: Yes  Patient Presentation: Responsive  Reason for Admission (Per Patient): Active Symptoms  Patient Stressors:    Coping Skills:   Exercise, Talk  Leisure Interests (2+):  Community - Herbalist with dog)  Frequency of Recreation/Participation: Monthly  Awareness of Community Resources:     [employer]:     Current Use:    If no, Barriers?:    Expressed Interest in [place] of Residence:  Hydrologist  Patient Main Form of Transportation: Musician  Patient Strengths:  Compassion, Listening  Patient Identified Areas of Improvement:  Not take on others problems  Patient Goal for Hospitalization:  Learn what resources are available  Current SI (including self-harm):  No  Current HI:  No  Current AVH: No  Staff Intervention Plan: Group Attendance, Collaborate with Interdisciplinary Treatment Team  Consent to Intern Participation: N/A  Tammy Knight 03/31/2019, 2:46 PM

## 2019-03-31 NOTE — Discharge Summary (Signed)
Physician Discharge Summary Note  Patient:  Tammy Knight is an 28 y.o., female MRN:  [id number] DOB:  07-15-1990 Patient phone:  [phone] (home)  Patient address:   [place] [address],  Total Time spent with patient: 1 hour  Date of Admission:  03/31/2019 Date of Discharge: March 31, 2019  Reason for Admission: Admitted to the hospital because of worsening depression with a spell of transient suicidal ideation  Principal Problem: Severe recurrent major depression without psychotic features [facility]) Discharge Diagnoses: Principal Problem:   Severe recurrent major depression without psychotic features [facility]) Active Problems:   Pregnancy   Past Psychiatric History: Patient has a history of depression and outpatient treatment.  Has been on several antidepressants by primary care doctor but not seen a psychiatrist.  Does not have a history of engaging in appropriate therapy.  No history of psychosis or mania although does seem to have some chronic anxiety.  Past Medical History:  Past Medical History:  Diagnosis Date  . Asthma    h/o no inhalers  . Chlamydia   . Depression   . Hay fever   . UTI (urinary tract infection)     Past Surgical History:  Procedure Laterality Date  . ABDOMINOPLASTY  2014  . LAPAROSCOPIC SALPINGO OOPHERECTOMY Right 06/13/2018   Procedure: LAPAROSCOPIC RIGHT  OOPHORECTOMY;  Surgeon: Harlin Heys, MD;  Location: [facility] ORS;  Service: Gynecology;  Laterality: Right;  . TONSILLECTOMY     Family History:  Family History  Problem Relation Age of Onset  . Heart attack Father        2014  . Alcohol abuse Father   . Depression Father   . Hyperlipidemia Father   . Hypertension Father   . COPD Mother   . Glaucoma Mother   . Arthritis Mother   . Depression Mother   . Depression Sister   . Hearing loss Sister   . Diabetes Paternal Grandfather    Family Psychiatric  History: Family history of alcohol problems Social  History:  Social History   Substance and Sexual Activity  Alcohol Use Yes  . Alcohol/week: 5.0 standard drinks  . Types: 5 Glasses of wine per week   Comment: Occasional     Social History   Substance and Sexual Activity  Drug Use No    Social History   Socioeconomic History  . Marital status: Single    Spouse name: Not on file  . Number of children: Not on file  . Years of education: Not on file  . Highest education level: Not on file  Occupational History  . Not on file  Tobacco Use  . Smoking status: Former Smoker    Years: 5.00    Types: Cigarettes    Quit date: 01/07/2018    Years since quitting: 1.2  . Smokeless tobacco: Never Used  . Tobacco comment: 1-2 cig per day  Substance and Sexual Activity  . Alcohol use: Yes    Alcohol/week: 5.0 standard drinks    Types: 5 Glasses of wine per week    Comment: Occasional  . Drug use: No  . Sexual activity: Yes    Partners: Male    Birth control/protection: Condom  Other Topics Concern  . Not on file  Social History Narrative  . Not on file   Social Determinants of Health   Financial Resource Strain:   . Difficulty of Paying Living Expenses: Not on file  Food Insecurity:   . Worried About Estate manager/land agent  of Food in the Last Year: Not on file  . Ran Out of Food in the Last Year: Not on file  Transportation Needs:   . Lack of Transportation (Medical): Not on file  . Lack of Transportation (Non-Medical): Not on file  Physical Activity:   . Days of Exercise per Week: Not on file  . Minutes of Exercise per Session: Not on file  Stress:   . Feeling of Stress : Not on file  Social Connections:   . Frequency of Communication with Friends and Family: Not on file  . Frequency of Social Gatherings with Friends and Family: Not on file  . Attends Religious Services: Not on file  . Active Member of Clubs or Organizations: Not on file  . Attends Archivist Meetings: Not on file  . Marital Status: Not on file     Hospital Course: Patient was admitted to our hospital psychiatric ward.  She was cooperative with the admission process.  Patient was evaluated by me and was also appropriate and cooperative and appeared to be honest and forthcoming.  Describes symptoms of depression and anxiety that have been worsening for several weeks.  She stopped taking her antidepressant which was Lexapro 20 mg a day several weeks ago.  She also is getting married this weekend earlier in the week had a hitch in her plans that was emotionally upsetting to her.  Here in the hospital she has denied any suicidal ideation.  In interview with me she denies suicidal thoughts particularly in the context of now finding out that she is pregnant.  She expresses excitement and positive feelings about being pregnant and about her fianc and her marriage.  She gave me consent to speak to her fianc.  He confirms that there are no guns in the house at this point.  He is supportive of her mental health care and feels safe with her being discharged.  Patient was counseled about symptoms of depression and the importance of getting sleep at night and the importance of getting follow-up outpatient treatment.  I agreed to give her a prescription to restart the Lexapro and also 25 mg of Seroquel at night to help with sleep and mood.  I would acknowledge that Zoloft is usually the more preferred antidepressant in pregnancy but the patient has a history of better response to Lexapro and there is no specific reason to avoid it.  Patient is given information for referral to outpatient psychiatric and therapy care.  She will be discharged this afternoon with prescriptions.  Physical Findings: AIMS:  , ,  ,  ,    CIWA:    COWS:     Musculoskeletal: Strength & Muscle Tone: within normal limits Gait & Station: normal Patient leans: N/A  Psychiatric Specialty Exam: Physical Exam  Nursing note and vitals reviewed. Constitutional: She appears  well-developed and well-nourished.  HENT:  Head: Normocephalic and atraumatic.  Eyes: Pupils are equal, round, and reactive to light. Conjunctivae are normal.  Cardiovascular: Normal heart sounds.  Respiratory: Effort normal.  GI: Soft.  Musculoskeletal:        General: Normal range of motion.     Cervical back: Normal range of motion.  Neurological: She is alert.  Skin: Skin is warm and dry.  Psychiatric: Her speech is normal and behavior is normal. Judgment and thought content normal. Her mood appears anxious. Cognition and memory are normal.    Review of Systems  Constitutional: Negative.   HENT: Negative.   Eyes:  Negative.   Respiratory: Negative.   Cardiovascular: Negative.   Gastrointestinal: Negative.   Musculoskeletal: Negative.   Skin: Negative.   Neurological: Negative.   Psychiatric/Behavioral: Positive for dysphoric mood and sleep disturbance. Negative for agitation, behavioral problems, confusion, decreased concentration, hallucinations, self-injury and suicidal ideas. The patient is nervous/anxious. The patient is not hyperactive.     Blood pressure 127/74, pulse 87, temperature 98.3 F (36.8 C), temperature source Oral, resp. rate 18, height 5\' 5"  (1.651 m), weight 89.8 kg, last menstrual period 02/24/2019, SpO2 100 %.Body mass index is 32.95 kg/m.  General Appearance: Casual  Eye Contact:  Good  Speech:  Clear and Coherent  Volume:  Normal  Mood:  Dysphoric  Affect:  Congruent  Thought Process:  Coherent  Orientation:  Full (Time, Place, and Person)  Thought Content:  Logical  Suicidal Thoughts:  No  Homicidal Thoughts:  No  Memory:  Immediate;   Fair Recent;   Fair Remote;   Fair  Judgement:  Fair  Insight:  Fair  Psychomotor Activity:  Normal  Concentration:  Concentration: Fair  Recall:  [place] of Knowledge:  Fair  Language:  Fair  Akathisia:  No  Handed:  Right  AIMS (if indicated):     Assets:  Communication Skills Desire for  Improvement Housing Physical Health Resilience Social Support  ADL's:  Intact  Cognition:  WNL  Sleep:        Have you used any form of tobacco in the last 30 days? (Cigarettes, Smokeless Tobacco, Cigars, and/or Pipes): Yes  Has this patient used any form of tobacco in the last 30 days? (Cigarettes, Smokeless Tobacco, Cigars, and/or Pipes) Yes, No  Blood Alcohol level:  Lab Results  Component Value Date   ETH <10 [id number]    Metabolic Disorder Labs:  No results found for: HGBA1C, MPG No results found for: PROLACTIN Lab Results  Component Value Date   CHOL 155 06/09/2018   TRIG 35 06/09/2018   HDL 58 06/09/2018   CHOLHDL 2.7 06/09/2018   VLDL 7.8 08/13/2017   LDLCALC 87 06/09/2018   LDLCALC 110 (H) 08/13/2017    See Psychiatric Specialty Exam and Suicide Risk Assessment completed by Attending Physician prior to discharge.  Discharge destination:  Home  Is patient on multiple antipsychotic therapies at discharge:  No   Has Patient had three or more failed trials of antipsychotic monotherapy by history:  No  Recommended Plan for Multiple Antipsychotic Therapies: NA  Discharge Instructions    Diet - low sodium heart healthy   Complete by: As directed    Increase activity slowly   Complete by: As directed      Allergies as of 03/31/2019      Reactions   Penicillins Anaphylaxis   Did it involve swelling of the face/tongue/throat, SOB, or low BP? Unknown Did it involve sudden or severe rash/hives, skin peeling, or any reaction on the inside of your mouth or nose? Unknown Did you need to seek medical attention at a hospital or doctor's office? Unknown When did it last happen?childhood allergy If all above answers are "NO", may proceed with cephalosporin use.   Bee Venom Swelling      Medication List    TAKE these medications     Indication  clonazePAM 0.5 MG tablet Commonly known as: KLONOPIN Take 1 tablet (0.5 mg total) by mouth 2 (two) times daily  as needed for anxiety.  Indication: Feeling Anxious   escitalopram 20 MG tablet Commonly known as: LEXAPRO  Take 1 tablet (20 mg total) by mouth daily.  Indication: Major Depressive Disorder   folic acid 1 MG tablet Commonly known as: FOLVITE Take 1 tablet (1 mg total) by mouth daily.  Indication: Pregnancy   QUEtiapine 25 MG tablet Commonly known as: SEROQUEL Take 1 tablet (25 mg total) by mouth at bedtime.  Indication: Major Depressive Disorder   STRESS B PO Take 2 tablets by mouth daily.  Indication: Pregnancy      Follow-up Information    Patient declined Follow up.   Why: Patient declined referral.  Patient provided list of mental health providers that accept her insurance in the community. Contact information: Patient declined.          Follow-up recommendations:  Activity:  Activity as tolerated Diet:  Regular diet Other:  Follow-up with outpatient psychiatric and mental health care  Comments: Patient given prescriptions at discharge.  Also contrary to what is noted above the patient was accepting of referral information for outpatient mental health care in the community and this information will be given to her at discharge.  Signed: Alethia Berthold, MD 03/31/2019, 3:58 PM

## 2019-03-31 NOTE — Progress Notes (Signed)
  [facility] Adult Case Management Discharge Plan :  Will you be returning to the same living situation after discharge:  Yes,  pt is returning home. At discharge, do you have transportation home?: Yes,  pt reports that her fiance is returning home. Do you have the ability to pay for your medications: No.  Pt reports that she has [employer].   Release of information consent forms completed and in the chart;  Patient's signature needed at discharge.  Patient to Follow up at: Follow-up Information    Patient declined Follow up.   Why: Patient declined referral.  Patient provided list of mental health providers that accept her insurance in the community. Contact information: Patient declined.          Next level of care provider has access to [place] and Suicide Prevention discussed: No.  Have you used any form of tobacco in the last 30 days? (Cigarettes, Smokeless Tobacco, Cigars, and/or Pipes): Yes  Has patient been referred to the Quitline?: Patient refused referral  Patient has been referred for addiction treatment: Pt. refused referral  Rozann Lesches, LCSW 03/31/2019, 3:38 PM

## 2019-03-31 NOTE — Progress Notes (Signed)
Recreation Therapy Notes  INPATIENT RECREATION TR PLAN  Patient Details Name: Leonela Kivi MRN: [id number] DOB: Oct 21, 1990 Today's Date: 03/31/2019  Rec Therapy Plan Is patient appropriate for Therapeutic Recreation?: Yes Treatment times per week: at least 3 Estimated Length of Stay: 5-7 days TR Treatment/Interventions: Group participation (Comment)  Discharge Criteria Pt will be discharged from therapy if:: Discharged Treatment plan/goals/alternatives discussed and agreed upon by:: Patient/family  Discharge Summary Short term goals set: Patient will engage in groups without prompting or encouragement from LRT x3 group sessions within 5 recreation therapy group sessions Short term goals met: Other (Comment)(Discharged same day) Reason goals not met: N/A Therapeutic equipment acquired: N/A Reason patient discharged from therapy: Discharge from hospital Pt/family agrees with progress & goals achieved: Yes Date patient discharged from therapy: 03/31/19   Sueellen Kayes 03/31/2019, 3:25 PM

## 2019-03-31 NOTE — Progress Notes (Signed)
Recreation Therapy Notes  INPATIENT RECREATION TR PLAN  Patient Details Name: Tammy Knight MRN: [id number] DOB: May 08, 1990 Today's Date: 03/31/2019  Rec Therapy Plan Is patient appropriate for Therapeutic Recreation?: Yes Treatment times per week: at least 3 Estimated Length of Stay: 5-7 days TR Treatment/Interventions: Group participation (Comment)  Discharge Criteria Pt will be discharged from therapy if:: Discharged Treatment plan/goals/alternatives discussed and agreed upon by:: Patient/family  Discharge Summary     Mahalie Kanner 03/31/2019, 2:47 PM

## 2019-03-31 NOTE — Tx Team (Signed)
Initial Treatment Plan 03/31/2019 3:21 PM Tammy Knight [id number]    PATIENT STRESSORS: Traumatic event Other: social    PATIENT STRENGTHS: Ability for insight Average or above average intelligence Financial means Motivation for treatment/growth   PATIENT IDENTIFIED PROBLEMS: DepressionAnxiety                     DISCHARGE CRITERIA:  Ability to meet basic life and health needs Improved stabilization in mood, thinking, and/or behavior Motivation to continue treatment in a less acute level of care  PRELIMINARY DISCHARGE PLAN: Outpatient therapy Participate in family therapy  PATIENT/FAMILY INVOLVEMENT: This treatment plan has been presented to and reviewed with the patient, Tammy Knight.  The patient has been given the opportunity to ask questions and make suggestions.  Ronelle Nigh, RN 03/31/2019, 3:21 PM

## 2019-03-31 NOTE — [facility] Suicide Risk Assessment (Signed)
[facility] Admission Suicide Risk Assessment   Nursing information obtained from:  Patient Demographic factors:  Caucasian Current Mental Status:  NA Loss Factors:  NA Historical Factors:  NA Risk Reduction Factors:  Pregnancy, Employed, Living with another person, especially a relative  Total Time spent with patient: 1 hour Principal Problem: Severe recurrent major depression without psychotic features ([place]) Diagnosis:  Principal Problem:   Severe recurrent major depression without psychotic features ([place]) Active Problems:   Pregnancy  Subjective Data: Patient seen and chart reviewed.  This is a 28 year old woman with a history of depression who went to the emergency room in [place] voluntarily because of new suicidal ideation in the context of multiple life stresses and depression.  She did not make an actual attempt to harm her self.  She has been cooperative with treatment.  On interview today the patient says since discovering she was pregnant she no longer has any thoughts of suicide whatsoever and feels that her mood is better.  She feels she has a more clear perspective on the things that have been stressing her.  Acknowledges that she has had depression and would do better if she continued with treatment.  Continued Clinical Symptoms:  Alcohol Use Disorder Identification Test Final Score (AUDIT): 2 The "Alcohol Use Disorders Identification Test", Guidelines for Use in Primary Care, Second Edition.  World [facility]ologist [facility]). Score between 0-7:  no or low risk or alcohol related problems. Score between 8-15:  moderate risk of alcohol related problems. Score between 16-19:  high risk of alcohol related problems. Score 20 or above:  warrants further diagnostic evaluation for alcohol dependence and treatment.   CLINICAL FACTORS:   Depression:   Insomnia   Musculoskeletal: Strength & Muscle Tone: within normal limits Gait & Station: normal Patient leans: N/A  Psychiatric  Specialty Exam: Physical Exam  Nursing note and vitals reviewed. Constitutional: She appears well-developed and well-nourished.  HENT:  Head: Normocephalic and atraumatic.  Eyes: Pupils are equal, round, and reactive to light. Conjunctivae are normal.  Cardiovascular: Regular rhythm and normal heart sounds.  Respiratory: Effort normal.  GI: Soft.  Musculoskeletal:        General: Normal range of motion.     Cervical back: Normal range of motion.  Neurological: She is alert.  Skin: Skin is warm and dry.  Psychiatric: She has a normal mood and affect. Her speech is normal and behavior is normal. Judgment and thought content normal. Cognition and memory are normal.    Review of Systems  Constitutional: Negative.   HENT: Negative.   Eyes: Negative.   Respiratory: Negative.   Cardiovascular: Negative.   Gastrointestinal: Negative.   Musculoskeletal: Negative.   Skin: Negative.   Neurological: Negative.   Psychiatric/Behavioral: Positive for dysphoric mood and sleep disturbance. Negative for agitation, behavioral problems, confusion, decreased concentration, hallucinations, self-injury and suicidal ideas. The patient is not nervous/anxious and is not hyperactive.     Blood pressure 127/74, pulse 87, temperature 98.3 F (36.8 C), temperature source Oral, resp. rate 18, height 5\' 5"  (1.651 m), weight 89.8 kg, last menstrual period 02/24/2019, SpO2 100 %.Body mass index is 32.95 kg/m.  General Appearance: Casual  Eye Contact:  Good  Speech:  Normal Rate  Volume:  Normal  Mood:  Dysphoric  Affect:  Congruent  Thought Process:  Goal Directed  Orientation:  Full (Time, Place, and Person)  Thought Content:  Logical  Suicidal Thoughts:  No  Homicidal Thoughts:  No  Memory:  Immediate;   Fair  Recent;   Fair Remote;   Fair  Judgement:  Good  Insight:  Fair  Psychomotor Activity:  Normal  Concentration:  Concentration: Fair  Recall:  [employer] of Knowledge:  Fair  Language:  Fair   Akathisia:  No  Handed:  Right  AIMS (if indicated):     Assets:  Communication Skills Desire for Improvement Housing Physical Health Resilience Social Support  ADL's:  Intact  Cognition:  WNL  Sleep:         COGNITIVE FEATURES THAT CONTRIBUTE TO RISK:  Thought constriction (tunnel vision)    SUICIDE RISK:   Minimal: No identifiable suicidal ideation.  Patients presenting with no risk factors but with morbid ruminations; may be classified as minimal risk based on the severity of the depressive symptoms  PLAN OF CARE: Patient is agreeable to plans for outpatient treatment.  She completely denies any suicidal ideation at this time.  Case reviewed with treatment team.  Spoke with her fianc who feels safe with her being discharged.  Plan will be for discharge back home and referral for outpatient treatment.  I certify that inpatient services furnished can reasonably be expected to improve the patient's condition.   Alethia Berthold, MD 03/31/2019, 3:41 PM

## 2019-03-31 NOTE — H&P (Signed)
Psychiatric Admission Assessment Adult  Patient Identification: Tammy Knight MRN:  [id number] Date of Evaluation:  03/31/2019 Chief Complaint:  MDD (major depressive disorder) [F32.9] Principal Diagnosis: Severe recurrent major depression without psychotic features ([place]) Diagnosis:  Principal Problem:   Severe recurrent major depression without psychotic features ([place]) Active Problems:   Pregnancy  History of Present Illness: This is a 28 year old woman referred to [place] from [place].  Patient seen chart reviewed.  With her consent I spoke to her fianc as well.  Patient presented to the hospital yesterday after calling her outpatient provider to discuss her symptoms.  Patient describes feeling increasingly anxious and dysphoric for the last few weeks.  She says she has some disrupted sleep at night.  Feels anxious much of the time.  Patient has been in the midst of planning a wedding which is actually scheduled for tomorrow, December 12.  Earlier this week there was a Emergency planning/management officer in their plans and she felt like the photographer was making rude comments to her.  Evidently this was sort of the last straw to her nerves really feeling bad.  She admits she started having thoughts that her fianc would be better off without her.  She actually took his gun from where he keeps it and took it into the bathroom with her where she sat for a long time and took a shower.  She did not attempt to shoot herself.  She came out woke her fianc up and gave him the gun.  He immediately unloaded it and placed it beyond her reach.  On interview today the patient is insightful.  She says she realizes she has been more depressed since she stopped taking her Lexapro about 4 weeks ago.  She denies any hallucinations.  There do not seem to be any psychotic symptoms.  Patient does describe anxiety and only partial response to antidepressants in the past but does not describe any history of psychosis.  She denies alcohol  and drug abuse.  Patient just discovered yesterday also that she is pregnant a discovery that evidently came after the incident with the gun.  Today patient is appropriate in her interaction and agreeable to outpatient treatment and is requesting discharge. Associated Signs/Symptoms: Depression Symptoms:  depressed mood, suicidal thoughts with specific plan, anxiety, disturbed sleep, (Hypo) Manic Symptoms:  None reported Anxiety Symptoms:  Excessive Worry, Psychotic Symptoms:  None PTSD Symptoms: Negative Total Time spent with patient: 1 hour  Past Psychiatric History: Patient has been treated for depression intermittently for some time.  She recalls several medicines in the past including Zoloft Effexor Paxil and most recently Lexapro.  Most of her medication treatment has been through her primary care doctor.  Apparently has not seen a psychiatrist.  Has seen a therapist in the past but it is been a while and she did not feel like she got much benefit from it.  No past history of suicide attempts violence or psychosis.  Patient has also been prescribed clonazepam 0.5 mg as needed by her outpatient provider but says she takes it rarely only perhaps 3 times a month at most.  Is the patient at risk to self? No.  Has the patient been a risk to self in the past 6 months? No.  Has the patient been a risk to self within the distant past? No.  Is the patient a risk to others? No.  Has the patient been a risk to others in the past 6 months? No.  Has the patient been  a risk to others within the distant past? No.   Prior Inpatient Therapy:   Prior Outpatient Therapy:    Alcohol Screening: 1. How often do you have a drink containing alcohol?: Monthly or less 2. How many drinks containing alcohol do you have on a typical day when you are drinking?: 3 or 4 3. How often do you have six or more drinks on one occasion?: Never AUDIT-C Score: 2 4. How often during the last year have you found that you  were not able to stop drinking once you had started?: Never 5. How often during the last year have you failed to do what was normally expected from you becasue of drinking?: Never 6. How often during the last year have you needed a first drink in the morning to get yourself going after a heavy drinking session?: Never 7. How often during the last year have you had a feeling of guilt of remorse after drinking?: Never 8. How often during the last year have you been unable to remember what happened the night before because you had been drinking?: Never 9. Have you or someone else been injured as a result of your drinking?: No 10. Has a relative or friend or a doctor or another health worker been concerned about your drinking or suggested you cut down?: No Alcohol Use Disorder Identification Test Final Score (AUDIT): 2 Alcohol Brief Interventions/Follow-up: Continued Monitoring Substance Abuse History in the last 12 months:  No. Consequences of Substance Abuse: Negative Previous Psychotropic Medications: Yes  Psychological Evaluations: Yes  Past Medical History:  Past Medical History:  Diagnosis Date  . Asthma    h/o no inhalers  . Chlamydia   . Depression   . Hay fever   . UTI (urinary tract infection)     Past Surgical History:  Procedure Laterality Date  . ABDOMINOPLASTY  2014  . LAPAROSCOPIC SALPINGO OOPHERECTOMY Right 06/13/2018   Procedure: LAPAROSCOPIC RIGHT  OOPHORECTOMY;  Surgeon: Harlin Heys, MD;  Location: [facility] ORS;  Service: Gynecology;  Laterality: Right;  . TONSILLECTOMY     Family History:  Family History  Problem Relation Age of Onset  . Heart attack Father        2014  . Alcohol abuse Father   . Depression Father   . Hyperlipidemia Father   . Hypertension Father   . COPD Mother   . Glaucoma Mother   . Arthritis Mother   . Depression Mother   . Depression Sister   . Hearing loss Sister   . Diabetes Paternal Grandfather    Family Psychiatric  History:  Patient reports her father has an alcohol abuse problem as do several other men in the family.  No other known psychiatric history Tobacco Screening: Have you used any form of tobacco in the last 30 days? (Cigarettes, Smokeless Tobacco, Cigars, and/or Pipes): Yes Tobacco use, Select all that apply: 4 or less cigarettes per day Are you interested in Tobacco Cessation Medications?: Yes, will notify MD for an order Counseled patient on smoking cessation including recognizing danger situations, developing coping skills and basic information about quitting provided: Yes Social History:  Social History   Substance and Sexual Activity  Alcohol Use Yes  . Alcohol/week: 5.0 standard drinks  . Types: 5 Glasses of wine per week   Comment: Occasional     Social History   Substance and Sexual Activity  Drug Use No    Additional Social History:  Allergies:   Allergies  Allergen Reactions  . Penicillins Anaphylaxis    Did it involve swelling of the face/tongue/throat, SOB, or low BP? Unknown Did it involve sudden or severe rash/hives, skin peeling, or any reaction on the inside of your mouth or nose? Unknown Did you need to seek medical attention at a hospital or doctor's office? Unknown When did it last happen?childhood allergy If all above answers are "NO", may proceed with cephalosporin use.   . Bee Venom Swelling   Lab Results:  Results for orders placed or performed during the hospital encounter of 03/30/19 (from the past 48 hour(s))  Comprehensive metabolic panel     Status: Abnormal   Collection Time: 03/30/19 12:58 PM  Result Value Ref Range   Sodium 139 135 - 145 mmol/L   Potassium 3.6 3.5 - 5.1 mmol/L   Chloride 108 98 - 111 mmol/L   CO2 26 22 - 32 mmol/L   Glucose, Bld 99 70 - 99 mg/dL   BUN 10 6 - 20 mg/dL   Creatinine, Ser 0.52 0.44 - 1.00 mg/dL   Calcium 8.8 (L) 8.9 - 10.3 mg/dL   Total Protein 6.4 (L) 6.5 - 8.1 g/dL   Albumin  4.0 3.5 - 5.0 g/dL   AST 18 15 - 41 U/L   ALT 15 0 - 44 U/L   Alkaline Phosphatase 39 38 - 126 U/L   Total Bilirubin 0.8 0.3 - 1.2 mg/dL   GFR calc non Af Amer >60 >60 mL/min   GFR calc Af Amer >60 >60 mL/min   Anion gap 5 5 - 15    Comment: Performed at [facility], [place] [address] [place], [place] [address]  Ethanol     Status: None   Collection Time: 03/30/19 12:58 PM  Result Value Ref Range   Alcohol, Ethyl (B) <10 <10 mg/dL    Comment: (NOTE) Lowest detectable limit for serum alcohol is 10 mg/dL. For medical purposes only. Performed at [facility], [place] [address], [place], [place] [id number]   Salicylate level     Status: None   Collection Time: 03/30/19 12:58 PM  Result Value Ref Range   Salicylate Lvl [id number] 2.8 - 30.0 mg/dL    Comment: Performed at [facility], [place] [address] [place], [place] [address]  Acetaminophen level     Status: Abnormal   Collection Time: 03/30/19 12:58 PM  Result Value Ref Range   Acetaminophen (Tylenol), Serum <10 (L) 10 - 30 ug/mL    Comment: (NOTE) Therapeutic concentrations vary significantly. A range of 10-30 ug/mL  may be an effective concentration for many patients. However, some  are best treated at concentrations outside of this range. Acetaminophen concentrations >150 ug/mL at 4 hours after ingestion  and >50 ug/mL at 12 hours after ingestion are often associated with  toxic reactions. Performed at [facility], [place] [address], [place], [place] [address]   cbc     Status: None   Collection Time: 03/30/19 12:58 PM  Result Value Ref Range   WBC 6.4 4.0 - 10.5 K/uL   RBC 3.91 3.87 - 5.11 MIL/uL   Hemoglobin 12.5 12.0 - 15.0 g/dL   HCT 37.8 36.0 - 46.0 %   MCV 96.7 80.0 - 100.0 fL   MCH 32.0 26.0 - 34.0 pg   MCHC 33.1 30.0 - 36.0 g/dL   RDW 12.5 11.5 - 15.5 %   Platelets 258 150 - 400 K/uL   nRBC  0.0 0.0 - 0.2 %    Comment: Performed at [facility], [place] [address] [place], [place] [address]  hCG, quantitative, pregnancy     Status: Abnormal   Collection Time: 03/30/19  1:00 PM  Result Value Ref Range   hCG, Beta Chain, Quant, S 5,417 (H) <5 mIU/mL    Comment:          GEST. AGE      CONC.  (mIU/mL)   <=1 WEEK        5 - 50     2 WEEKS       50 - 500     3 WEEKS       100 - 10,000     4 WEEKS     1,000 - 30,000     5 WEEKS     3,500 - 115,000   6-8 WEEKS     12,000 - 270,000    12 WEEKS     15,000 - 220,000        FEMALE AND NON-PREGNANT FEMALE:     LESS THAN 5 mIU/mL Performed at [facility], [place] [address], [place], [place] [address]   I-Stat beta hCG blood, ED     Status: Abnormal   Collection Time: 03/30/19  1:12 PM  Result Value Ref Range   I-stat hCG, quantitative >2,000.0 (H) <5 mIU/mL   Comment 3            Comment:   GEST. AGE      CONC.  (mIU/mL)   <=1 WEEK        5 - 50     2 WEEKS       50 - 500     3 WEEKS       100 - 10,000     4 WEEKS     1,000 - 30,000        FEMALE AND NON-PREGNANT FEMALE:     LESS THAN 5 mIU/mL   SARS CORONAVIRUS 2 (TAT 6-24 HRS) Nasopharyngeal Nasopharyngeal Swab     Status: None   Collection Time: 03/30/19  2:09 PM   Specimen: Nasopharyngeal Swab  Result Value Ref Range   SARS Coronavirus 2 NEGATIVE NEGATIVE    Comment: (NOTE) SARS-CoV-2 target nucleic acids are NOT DETECTED. The SARS-CoV-2 RNA is generally detectable in upper and lower respiratory specimens during the acute phase of infection. Negative results do not preclude SARS-CoV-2 infection, do not rule out co-infections with other pathogens, and should not be used as the sole basis for treatment or other patient management decisions. Negative results must be combined with clinical observations, patient history, and epidemiological information. The expected result is Negative. Fact Sheet for Patients: SugarRoll.be Fact Sheet for Healthcare  Providers: https://www.woods-mathews.com/ This test is not yet approved or cleared by the [place] FDA and  has been authorized for detection and/or diagnosis of SARS-CoV-2 by FDA under an Emergency Use Authorization (EUA). This EUA will remain  in effect (meaning this test can be used) for the duration of the COVID-19 declaration under Section 56 4(b)(1) of the Act, 21 U.S.C. section 360bbb-3(b)(1), unless the authorization is terminated or revoked sooner. Performed at [facility], [place] [address] [place], [place] [address]   Rapid urine drug screen (hospital performed)     Status: None   Collection Time: 03/30/19  4:12 PM  Result Value Ref Range   Opiates NONE DETECTED NONE DETECTED   Cocaine NONE DETECTED  NONE DETECTED   Benzodiazepines NONE DETECTED NONE DETECTED   Amphetamines NONE DETECTED NONE DETECTED   Tetrahydrocannabinol NONE DETECTED NONE DETECTED   Barbiturates NONE DETECTED NONE DETECTED    Comment: (NOTE) DRUG SCREEN FOR MEDICAL PURPOSES ONLY.  IF CONFIRMATION IS NEEDED FOR ANY PURPOSE, NOTIFY LAB WITHIN 5 DAYS. LOWEST DETECTABLE LIMITS FOR URINE DRUG SCREEN Drug Class                     Cutoff (ng/mL) Amphetamine and metabolites    1000 Barbiturate and metabolites    200 Benzodiazepine                 [id number] Tricyclics and metabolites     300 Opiates and metabolites        300 Cocaine and metabolites        300 THC                            50 Performed at [facility], [place] [address], [place], [place] [address]     Blood Alcohol level:  Lab Results  Component Value Date   ETH <10 [id number]    Metabolic Disorder Labs:  No results found for: HGBA1C, MPG No results found for: PROLACTIN Lab Results  Component Value Date   CHOL 155 06/09/2018   TRIG 35 06/09/2018   HDL 58 06/09/2018   CHOLHDL 2.7 06/09/2018   VLDL 7.8 08/13/2017   LDLCALC 87 06/09/2018   LDLCALC 110 (H) 08/13/2017    Current  Medications: Current Facility-Administered Medications  Medication Dose Route Frequency Provider Last Rate Last Admin  . acetaminophen (TYLENOL) tablet 650 mg  650 mg Oral Q6H PRN Dixon, Rashaun M, NP      . escitalopram (LEXAPRO) tablet 20 mg  20 mg Oral Daily Aymara Sassi T, MD      . QUEtiapine (SEROQUEL) tablet 25 mg  25 mg Oral QHS Rucha Wissinger T, MD       PTA Medications: Medications Prior to Admission  Medication Sig Dispense Refill Last Dose  . clonazePAM (KLONOPIN) 0.5 MG tablet Take 1 tablet (0.5 mg total) by mouth 2 (two) times daily as needed for anxiety. 30 tablet 1 03/29/2019 at unknown  . Multiple Vitamin (STRESS B PO) Take 2 tablets by mouth daily.   03/29/2019 at unknown    Musculoskeletal: Strength & Muscle Tone: within normal limits Gait & Station: normal Patient leans: N/A  Psychiatric Specialty Exam: Physical Exam  Nursing note and vitals reviewed. Constitutional: She appears well-developed and well-nourished.  HENT:  Head: Normocephalic and atraumatic.  Eyes: Pupils are equal, round, and reactive to light. Conjunctivae are normal.  Cardiovascular: Normal heart sounds.  Respiratory: Effort normal.  GI: Soft.  Musculoskeletal:        General: Normal range of motion.     Cervical back: Normal range of motion.  Neurological: She is alert.  Skin: Skin is warm and dry.  Psychiatric: Her speech is normal and behavior is normal. Judgment and thought content normal. Her mood appears anxious. Cognition and memory are normal.    Review of Systems  Constitutional: Negative.   HENT: Negative.   Eyes: Negative.   Respiratory: Negative.   Cardiovascular: Negative.   Gastrointestinal: Negative.   Musculoskeletal: Negative.   Skin: Negative.   Neurological: Negative.   Psychiatric/Behavioral: Positive for dysphoric mood. Negative for agitation, behavioral problems, confusion, decreased concentration, hallucinations, self-injury, sleep disturbance and suicidal  ideas.  The patient is nervous/anxious. The patient is not hyperactive.     Blood pressure 127/74, pulse 87, temperature 98.3 F (36.8 C), temperature source Oral, resp. rate 18, height 5\' 5"  (1.651 m), weight 89.8 kg, last menstrual period 02/24/2019, SpO2 100 %.Body mass index is 32.95 kg/m.  General Appearance: Casual  Eye Contact:  Good  Speech:  Clear and Coherent  Volume:  Normal  Mood:  Dysphoric  Affect:  Congruent  Thought Process:  Goal Directed  Orientation:  Full (Time, Place, and Person)  Thought Content:  Logical  Suicidal Thoughts:  No  Homicidal Thoughts:  No  Memory:  Immediate;   Fair Recent;   Fair Remote;   Fair  Judgement:  Fair  Insight:  Fair  Psychomotor Activity:  Normal  Concentration:  Concentration: Fair  Recall:  [employer] of Knowledge:  Fair  Language:  Fair  Akathisia:  No  Handed:  Right  AIMS (if indicated):     Assets:  Communication Skills Desire for Improvement Housing Physical Health Resilience Social Support  ADL's:  Intact  Cognition:  WNL  Sleep:       Treatment Plan Summary: Daily contact with patient to assess and evaluate symptoms and progress in treatment, Medication management and Plan Patient evaluated and I spoke to her fianc as well who appears to have a clear and appropriate understanding of her problems with depression.  Although patient does still have some symptoms of depression she is not psychotic and she tells me that since learning she is pregnant she absolutely feels that she would not have any thoughts of harming herself.  Patient makes the point that given that she has a wedding scheduled for tomorrow the level of disruption caused by staying in the hospital might actually be a much greater stress than being discharged.  Furthermore she plans to be with her fianc.  I spoke with him and the gun is actually now sold and there are no guns in the house.  He has a good understanding of the illness and is in favor of her getting  outpatient mental health treatment.  Based on this I am going to discharge her today.  I have suggested we start a low-dose of Seroquel and start her back on her Lexapro.  Patient will be given resources and recommendations for outpatient follow-up.  Observation Level/Precautions:  15 minute checks  Laboratory:  Chemistry Profile  Psychotherapy:    Medications:    Consultations:    Discharge Concerns:    Estimated LOS:  Other:     Physician Treatment Plan for Primary Diagnosis: Severe recurrent major depression without psychotic features ([place]) Long Term Goal(s): Improvement in symptoms so as ready for discharge  Short Term Goals: Ability to verbalize feelings will improve, Ability to disclose and discuss suicidal ideas and Ability to demonstrate self-control will improve  Physician Treatment Plan for Secondary Diagnosis: Principal Problem:   Severe recurrent major depression without psychotic features ([place]) Active Problems:   Pregnancy  Long Term Goal(s): Improvement in symptoms so as ready for discharge  Short Term Goals: Compliance with prescribed medications will improve and Ability to identify triggers associated with substance abuse/mental health issues will improve  I certify that inpatient services furnished can reasonably be expected to improve the patient's condition.    Alethia Berthold, MD 12/11/20203:47 PM

## 2019-03-31 NOTE — ED Notes (Signed)
Pt spoke with mother

## 2019-03-31 NOTE — BH Assessment (Signed)
Clinician contacted Lynnda Child, RN and Charge Nurse Jenny Reichmann, RN, regarding pt being accepted to [facility]. Mechele Claude stated pt could arrive to BMU any time after 0730 and John expressed an understanding. John stated pt would still be going to the room previously planned.  Room: 319 Accepting: Talbot Grumbling, NP Attending: Dr. Weber Cooks Call to Report: [phone]

## 2019-03-31 NOTE — BH Assessment (Signed)
Pt has been accepted to [facility] pending a negative COVID test and planned d/c tomorrow (03/31/2019). This information was provided to pt's nurse, Jeannine Boga, at [phone].  Room: 319 Accepting: Talbot Grumbling, NP Attending: Dr. Weber Cooks Call to Report: [phone]

## 2019-03-31 NOTE — Progress Notes (Signed)
Patient discharged per MD recommendation. Follow up instructions provided and patient verbalized understanding and motivation. Denied SI/HI upon discharge

## 2019-03-31 NOTE — ED Notes (Signed)
Attempted to call report to [place], staff unavailable to take report

## 2019-03-31 NOTE — BH Assessment (Signed)
Clinician contacted [facility] and informed them that pt would not be coming to their facility for services; the person who took this information could be heard telling the [facility], Vicente Males, and she expressed an understanding.

## 2019-05-02 ENCOUNTER — Ambulatory Visit (INDEPENDENT_AMBULATORY_CARE_PROVIDER_SITE_OTHER): Payer: BC Managed Care – PPO | Admitting: Obstetrics and Gynecology

## 2019-05-02 ENCOUNTER — Other Ambulatory Visit: Payer: Self-pay

## 2019-05-02 VITALS — BP 105/66 | HR 76 | Ht 65.0 in | Wt 200.1 lb

## 2019-05-02 DIAGNOSIS — Z3491 Encounter for supervision of normal pregnancy, unspecified, first trimester: Secondary | ICD-10-CM | POA: Diagnosis not present

## 2019-05-02 DIAGNOSIS — Z113 Encounter for screening for infections with a predominantly sexual mode of transmission: Secondary | ICD-10-CM

## 2019-05-02 DIAGNOSIS — Z131 Encounter for screening for diabetes mellitus: Secondary | ICD-10-CM

## 2019-05-02 DIAGNOSIS — Z1329 Encounter for screening for other suspected endocrine disorder: Secondary | ICD-10-CM

## 2019-05-02 DIAGNOSIS — Z6832 Body mass index (BMI) 32.0-32.9, adult: Secondary | ICD-10-CM

## 2019-05-02 DIAGNOSIS — Z0283 Encounter for blood-alcohol and blood-drug test: Secondary | ICD-10-CM

## 2019-05-02 NOTE — Progress Notes (Signed)
Tammy Knight presents for NOB nurse interview visit.  Pregnancy confirmation done at [facility].  G-1.  P-0.  LMP 02/24/2019.  EDD 12/01/2019. Dating scan not done.  Pregnancy education material explained and given.  No cats in the home.  NOB labs ordered.  TSH/HgA1C ordered due to increased BMI.  Body mass index is 33.3 kg/m.  Sickle cell not ordered. HIV and drug screen were explained and ordered.  PNV encouraged.  Genetic screening options discussed.  Genetic testing: Unsure.  FMLA form signed and financial policy reviewed.  NOB physical scheduled 05/26/2019 with DJE.

## 2019-05-02 NOTE — Patient Instructions (Signed)
First Trimester of Pregnancy The first trimester of pregnancy is from week 1 until the end of week 13 (months 1 through 3). A week after a sperm fertilizes an egg, the egg will implant on the wall of the uterus. This embryo will begin to develop into a baby. Genes from you and your partner will form the baby. The female genes will determine whether the baby will be a boy or a girl. At 6-8 weeks, the eyes and face will be formed, and the heartbeat can be seen on ultrasound. At the end of 12 weeks, all the baby's organs will be formed. Now that you are pregnant, you will want to do everything you can to have a healthy baby. Two of the most important things are to get good prenatal care and to follow your health care provider's instructions. Prenatal care is all the medical care you receive before the baby's birth. This care will help prevent, find, and treat any problems during the pregnancy and childbirth. Body changes during your first trimester Your body goes through many changes during pregnancy. The changes vary from woman to woman.  You may gain or lose a couple of pounds at first.  You may feel sick to your stomach (nauseous) and you may throw up (vomit). If the vomiting is uncontrollable, call your health care provider.  You may tire easily.  You may develop headaches that can be relieved by medicines. All medicines should be approved by your health care provider.  You may urinate more often. Painful urination may mean you have a bladder infection.  You may develop heartburn as a result of your pregnancy.  You may develop constipation because certain hormones are causing the muscles that push stool through your intestines to slow down.  You may develop hemorrhoids or swollen veins (varicose veins).  Your breasts may begin to grow larger and become tender. Your nipples may stick out more, and the tissue that surrounds them (areola) may become darker.  Your gums may bleed and may be  sensitive to brushing and flossing.  Dark spots or blotches (chloasma, mask of pregnancy) may develop on your face. This will likely fade after the baby is born.  Your menstrual periods will stop.  You may have a loss of appetite.  You may develop cravings for certain kinds of food.  You may have changes in your emotions from day to day, such as being excited to be pregnant or being concerned that something may go wrong with the pregnancy and baby.  You may have more vivid and strange dreams.  You may have changes in your hair. These can include thickening of your hair, rapid growth, and changes in texture. Some women also have hair loss during or after pregnancy, or hair that feels dry or thin. Your hair will most likely return to normal after your baby is born. What to expect at prenatal visits During a routine prenatal visit:  You will be weighed to make sure you and the baby are growing normally.  Your blood pressure will be taken.  Your abdomen will be measured to track your baby's growth.  The fetal heartbeat will be listened to between weeks 10 and 14 of your pregnancy.  Test results from any previous visits will be discussed. Your health care provider may ask you:  How you are feeling.  If you are feeling the baby move.  If you have had any abnormal symptoms, such as leaking fluid, bleeding, severe headaches, or abdominal   cramping.  If you are using any tobacco products, including cigarettes, chewing tobacco, and electronic cigarettes.  If you have any questions. Other tests that may be performed during your first trimester include:  Blood tests to find your blood type and to check for the presence of any previous infections. The tests will also be used to check for low iron levels (anemia) and protein on red blood cells (Rh antibodies). Depending on your risk factors, or if you previously had diabetes during pregnancy, you may have tests to check for high blood sugar  that affects pregnant women (gestational diabetes).  Urine tests to check for infections, diabetes, or protein in the urine.  An ultrasound to confirm the proper growth and development of the baby.  Fetal screens for spinal cord problems (spina bifida) and Down syndrome.  HIV (human immunodeficiency virus) testing. Routine prenatal testing includes screening for HIV, unless you choose not to have this test.  You may need other tests to make sure you and the baby are doing well. Follow these instructions at home: Medicines  Follow your health care provider's instructions regarding medicine use. Specific medicines may be either safe or unsafe to take during pregnancy.  Take a prenatal vitamin that contains at least 600 micrograms (mcg) of folic acid.  If you develop constipation, try taking a stool softener if your health care provider approves. Eating and drinking   Eat a balanced diet that includes fresh fruits and vegetables, whole grains, good sources of protein such as meat, eggs, or tofu, and low-fat dairy. Your health care provider will help you determine the amount of weight gain that is right for you.  Avoid raw meat and uncooked cheese. These carry germs that can cause birth defects in the baby.  Eating four or five small meals rather than three large meals a day may help relieve nausea and vomiting. If you start to feel nauseous, eating a few soda crackers can be helpful. Drinking liquids between meals, instead of during meals, also seems to help ease nausea and vomiting.  Limit foods that are high in fat and processed sugars, such as fried and sweet foods.  To prevent constipation: ? Eat foods that are high in fiber, such as fresh fruits and vegetables, whole grains, and beans. ? Drink enough fluid to keep your urine clear or pale yellow. Activity  Exercise only as directed by your health care provider. Most women can continue their usual exercise routine during  pregnancy. Try to exercise for 30 minutes at least 5 days a week. Exercising will help you: ? Control your weight. ? Stay in shape. ? Be prepared for labor and delivery.  Experiencing pain or cramping in the lower abdomen or lower back is a good sign that you should stop exercising. Check with your health care provider before continuing with normal exercises.  Try to avoid standing for long periods of time. Move your legs often if you must stand in one place for a long time.  Avoid heavy lifting.  Wear low-heeled shoes and practice good posture.  You may continue to have sex unless your health care provider tells you not to. Relieving pain and discomfort  Wear a good support bra to relieve breast tenderness.  Take warm sitz baths to soothe any pain or discomfort caused by hemorrhoids. Use hemorrhoid cream if your health care provider approves.  Rest with your legs elevated if you have leg cramps or low back pain.  If you develop varicose veins in   your legs, wear support hose. Elevate your feet for 15 minutes, 3-4 times a day. Limit salt in your diet. Prenatal care  Schedule your prenatal visits by the twelfth week of pregnancy. They are usually scheduled monthly at first, then more often in the last 2 months before delivery.  Write down your questions. Take them to your prenatal visits.  Keep all your prenatal visits as told by your health care provider. This is important. Safety  Wear your seat belt at all times when driving.  Make a list of emergency phone numbers, including numbers for family, friends, the hospital, and police and fire departments. General instructions  Ask your health care provider for a referral to a local prenatal education class. Begin classes no later than the beginning of month 6 of your pregnancy.  Ask for help if you have counseling or nutritional needs during pregnancy. Your health care provider can offer advice or refer you to specialists for help  with various needs.  Do not use hot tubs, steam rooms, or saunas.  Do not douche or use tampons or scented sanitary pads.  Do not cross your legs for long periods of time.  Avoid cat litter boxes and soil used by cats. These carry germs that can cause birth defects in the baby and possibly loss of the fetus by miscarriage or stillbirth.  Avoid all smoking, herbs, alcohol, and medicines not prescribed by your health care provider. Chemicals in these products affect the formation and growth of the baby.  Do not use any products that contain nicotine or tobacco, such as cigarettes and e-cigarettes. If you need help quitting, ask your health care provider. You may receive counseling support and other resources to help you quit.  Schedule a dentist appointment. At home, brush your teeth with a soft toothbrush and be gentle when you floss. Contact a health care provider if:  You have dizziness.  You have mild pelvic cramps, pelvic pressure, or nagging pain in the abdominal area.  You have persistent nausea, vomiting, or diarrhea.  You have a bad smelling vaginal discharge.  You have pain when you urinate.  You notice increased swelling in your face, hands, legs, or ankles.  You are exposed to fifth disease or chickenpox.  You are exposed to [place] measles (rubella) and have never had it. Get help right away if:  You have a fever.  You are leaking fluid from your vagina.  You have spotting or bleeding from your vagina.  You have severe abdominal cramping or pain.  You have rapid weight gain or loss.  You vomit blood or material that looks like coffee grounds.  You develop a severe headache.  You have shortness of breath.  You have any kind of trauma, such as from a fall or a car accident. Summary  The first trimester of pregnancy is from week 1 until the end of week 13 (months 1 through 3).  Your body goes through many changes during pregnancy. The changes vary from  woman to woman.  You will have routine prenatal visits. During those visits, your health care provider will examine you, discuss any test results you may have, and talk with you about how you are feeling. This information is not intended to replace advice given to you by your health care provider. Make sure you discuss any questions you have with your health care provider. Document Revised: 03/19/2017 Document Reviewed: 03/18/2016 Elsevier Patient Education  2020 [employer].  Commonly Asked Questions During Pregnancy  Cats: A parasite can be excreted in cat feces.  To avoid exposure you need to have another person empty the little box.  If you must empty the litter box you will need to wear gloves.  Wash your hands after handling your cat.  This parasite can also be found in raw or undercooked meat so this should also be avoided.  Colds, Sore Throats, Flu: Please check your medication sheet to see what you can take for symptoms.  If your symptoms are unrelieved by these medications please call the office.  Dental Work: Most any dental work [employer] recommends is permitted.  X-rays should only be taken during the first trimester if absolutely necessary.  Your abdomen should be shielded with a lead apron during all x-rays.  Please notify your provider prior to receiving any x-rays.  Novocaine is fine; gas is not recommended.  If your dentist requires a note from [place] prior to dental work please call the office and we will provide one for you.  Exercise: Exercise is an important part of staying healthy during your pregnancy.  You may continue most exercises you were accustomed to prior to pregnancy.  Later in your pregnancy you will most likely notice you have difficulty with activities requiring balance like riding a bicycle.  It is important that you listen to your body and avoid activities that put you at a higher risk of falling.  Adequate rest and staying well hydrated are a must!  If you have  questions about the safety of specific activities ask your provider.    Exposure to Children with illness: Try to avoid obvious exposure; report any symptoms to [place] when noted,  If you have chicken pos, red measles or mumps, you should be immune to these diseases.   Please do not take any vaccines while pregnant unless you have checked with your OB provider.  Fetal Movement: After 28 weeks we recommend you do "kick counts" twice daily.  Lie or sit down in a calm quiet environment and count your baby movements "kicks".  You should feel your baby at least 10 times per hour.  If you have not felt 10 kicks within the first hour get up, walk around and have something sweet to eat or drink then repeat for an additional hour.  If count remains less than 10 per hour notify your provider.  Fumigating: Follow your pest control agent's advice as to how long to stay out of your home.  Ventilate the area well before re-entering.  Hemorrhoids:   Most over-the-counter preparations can be used during pregnancy.  Check your medication to see what is safe to use.  It is important to use a stool softener or fiber in your diet and to drink lots of liquids.  If hemorrhoids seem to be getting worse please call the office.   Hot Tubs:  Hot tubs Jacuzzis and saunas are not recommended while pregnant.  These increase your internal body temperature and should be avoided.  Intercourse:  Sexual intercourse is safe during pregnancy as long as you are comfortable, unless otherwise advised by your provider.  Spotting may occur after intercourse; report any bright red bleeding that is heavier than spotting.  Labor:  If you know that you are in labor, please go to the hospital.  If you are unsure, please call the office and let us help you decide what to do.  Lifting, straining, etc:  If your job requires heavy lifting or straining please check with  your provider for any limitations.  Generally, you should not lift items heavier than  that you can lift simply with your hands and arms (no back muscles)  Painting:  Paint fumes do not harm your pregnancy, but may make you ill and should be avoided if possible.  Latex or water based paints have less odor than oils.  Use adequate ventilation while painting.  Permanents & Hair Color:  Chemicals in hair dyes are not recommended as they cause increase hair dryness which can increase hair loss during pregnancy.  " Highlighting" and permanents are allowed.  Dye may be absorbed differently and permanents may not hold as well during pregnancy.  Sunbathing:  Use a sunscreen, as skin burns easily during pregnancy.  Drink plenty of fluids; avoid over heating.  Tanning Beds:  Because their possible side effects are still unknown, tanning beds are not recommended.  Ultrasound Scans:  Routine ultrasounds are performed at approximately 20 weeks.  You will be able to see your baby's general anatomy an if you would like to know the gender this can usually be determined as well.  If it is questionable when you conceived you may also receive an ultrasound early in your pregnancy for dating purposes.  Otherwise ultrasound exams are not routinely performed unless there is a medical necessity.  Although you can request a scan we ask that you pay for it when conducted because insurance does not cover " patient request" scans.  Work: If your pregnancy proceeds without complications you may work until your due date, unless your physician or employer advises otherwise.  Round Ligament Pain/Pelvic Discomfort:  Sharp, shooting pains not associated with bleeding are fairly common, usually occurring in the second trimester of pregnancy.  They tend to be worse when standing up or when you remain standing for long periods of time.  These are the result of pressure of certain pelvic ligaments called "round ligaments".  Rest, Tylenol and heat seem to be the most effective relief.  As the womb and fetus grow, they rise  out of the pelvis and the discomfort improves.  Please notify the office if your pain seems different than that described.  It may represent a more serious condition.   How a Baby Grows During Pregnancy  Pregnancy begins when a female's sperm enters a female's egg (fertilization). Fertilization usually happens in one of the tubes (fallopian tubes) that connect the ovaries to the womb (uterus). The fertilized egg moves down the fallopian tube to the uterus. Once it reaches the uterus, it implants into the lining of the uterus and begins to grow. For the first 10 weeks, the fertilized egg is called an embryo. After 10 weeks, it is called a fetus. As the fetus continues to grow, it receives oxygen and nutrients through tissue (placenta) that grows to support the developing baby. The placenta is the life support system for the baby. It provides oxygen and nutrition and removes waste. Learning as much as you can about your pregnancy and how your baby is developing can help you enjoy the experience. It can also make you aware of when there might be a problem and when to ask questions. How long does a typical pregnancy last? A pregnancy usually lasts 280 days, or about 40 weeks. Pregnancy is divided into three periods of growth, also called trimesters:  First trimester: 0-12 weeks.  Second trimester: 13-27 weeks.  Third trimester: 28-40 weeks. The day when your baby is ready to be born (full term) is  your estimated date of delivery. How does my baby develop month by month? First month  The fertilized egg attaches to the inside of the uterus.  Some cells will form the placenta. Others will form the fetus.  The arms, legs, brain, spinal cord, lungs, and heart begin to develop.  At the end of the first month, the heart begins to beat. Second month  The bones, inner ear, eyelids, hands, and feet form.  The genitals develop.  By the end of 8 weeks, all major organs are developing. Third  month  All of the internal organs are forming.  Teeth develop below the gums.  Bones and muscles begin to grow. The spine can flex.  The skin is transparent.  Fingernails and toenails begin to form.  Arms and legs continue to grow longer, and hands and feet develop.  The fetus is about 3 inches (7.6 cm) long. Fourth month  The placenta is completely formed.  The external sex organs, neck, outer ear, eyebrows, eyelids, and fingernails are formed.  The fetus can hear, swallow, and move its arms and legs.  The kidneys begin to produce urine.  The skin is covered with a white, waxy coating (vernix) and very fine hair (lanugo). Fifth month  The fetus moves around more and can be felt for the first time (quickening).  The fetus starts to sleep and wake up and may begin to suck its finger.  The nails grow to the end of the fingers.  The organ in the digestive system that makes bile (gallbladder) functions and helps to digest nutrients.  If your baby is a girl, eggs are present in her ovaries. If your baby is a boy, testicles start to move down into his scrotum. Sixth month  The lungs are formed.  The eyes open. The brain continues to develop.  Your baby has fingerprints and toe prints. Your baby's hair grows thicker.  At the end of the second trimester, the fetus is about 9 inches (22.9 cm) long. Seventh month  The fetus kicks and stretches.  The eyes are developed enough to sense changes in light.  The hands can make a grasping motion.  The fetus responds to sound. Eighth month  All organs and body systems are fully developed and functioning.  Bones harden, and taste buds develop. The fetus may hiccup.  Certain areas of the brain are still developing. The skull remains soft. Ninth month  The fetus gains about  lb (0.23 kg) each week.  The lungs are fully developed.  Patterns of sleep develop.  The fetus's head typically moves into a head-down position  (vertex) in the uterus to prepare for birth.  The fetus weighs 6-9 lb (2.72-4.08 kg) and is 19-20 inches (48.26-50.8 cm) long. What can I do to have a healthy pregnancy and help my baby develop? General instructions  Take prenatal vitamins as directed by your health care provider. These include vitamins such as folic acid, iron, calcium, and vitamin D. They are important for healthy development.  Take medicines only as directed by your health care provider. Read labels and ask a pharmacist or your health care provider whether over-the-counter medicines, supplements, and prescription drugs are safe to take during pregnancy.  Keep all follow-up visits as directed by your health care provider. This is important. Follow-up visits include prenatal care and screening tests. How do I know if my baby is developing well? At each prenatal visit, your health care provider will do several different tests to  check on your health and keep track of your baby's development. These include:  Fundal height and position. ? Your health care provider will measure your growing belly from your pubic bone to the top of the uterus using a tape measure. ? Your health care provider will also feel your belly to determine your baby's position.  Heartbeat. ? An ultrasound in the first trimester can confirm pregnancy and show a heartbeat, depending on how far along you are. ? Your health care provider will check your baby's heart rate at every prenatal visit.  Second trimester ultrasound. ? This ultrasound checks your baby's development. It also may show your baby's gender. What should I do if I have concerns about my baby's development? Always talk with your health care provider about any concerns that you may have about your pregnancy and your baby. Summary  A pregnancy usually lasts 280 days, or about 40 weeks. Pregnancy is divided into three periods of growth, also called trimesters.  Your health care provider  will monitor your baby's growth and development throughout your pregnancy.  Follow your health care provider's recommendations about taking prenatal vitamins and medicines during your pregnancy.  Talk with your health care provider if you have any concerns about your pregnancy or your developing baby. This information is not intended to replace advice given to you by your health care provider. Make sure you discuss any questions you have with your health care provider. Document Revised: 07/28/2018 Document Reviewed: 02/17/2017 Elsevier Patient Education  2020 [employer].

## 2019-05-03 ENCOUNTER — Other Ambulatory Visit: Payer: Self-pay | Admitting: Surgical

## 2019-05-03 DIAGNOSIS — Z3491 Encounter for supervision of normal pregnancy, unspecified, first trimester: Secondary | ICD-10-CM

## 2019-05-03 LAB — VARICELLA ZOSTER ANTIBODY, IGG: Varicella zoster IgG: 804 index (ref >165)

## 2019-05-03 LAB — MONITOR DRUG PROFILE 14(MW)
Amphetamine Scrn, Ur: NEGATIVE ng/mL
BARBITURATE SCREEN URINE: NEGATIVE ng/mL
BENZODIAZEPINE SCREEN, URINE: NEGATIVE ng/mL
Buprenorphine, Urine: NEGATIVE ng/mL
CANNABINOIDS UR QL SCN: NEGATIVE ng/mL
Cocaine (Metab) Scrn, Ur: NEGATIVE ng/mL
Creatinine(Crt), U: 36.5 mg/dL (ref 20.0–300.0)
Fentanyl, Urine: NEGATIVE pg/mL
Meperidine Screen, Urine: NEGATIVE ng/mL
Methadone Screen, Urine: NEGATIVE ng/mL
OXYCODONE+OXYMORPHONE UR QL SCN: NEGATIVE ng/mL
Opiate Scrn, Ur: NEGATIVE ng/mL
Ph of Urine: 7.4 (ref 4.5–8.9)
Phencyclidine Qn, Ur: NEGATIVE ng/mL
Propoxyphene Scrn, Ur: NEGATIVE ng/mL
SPECIFIC GRAVITY: 1.007
Tramadol Screen, Urine: NEGATIVE ng/mL

## 2019-05-03 LAB — ABO AND RH: Rh Factor: POSITIVE

## 2019-05-03 LAB — URINALYSIS, ROUTINE W REFLEX MICROSCOPIC
Bilirubin, UA: NEGATIVE
Glucose, UA: NEGATIVE
Ketones, UA: NEGATIVE
Leukocytes,UA: NEGATIVE
Nitrite, UA: NEGATIVE
Protein,UA: NEGATIVE
RBC, UA: NEGATIVE
Specific Gravity, UA: 1.011 (ref 1.005–1.030)
Urobilinogen, Ur: 0.2 mg/dL (ref 0.2–1.0)
pH, UA: 7.5 (ref 5.0–7.5)

## 2019-05-03 LAB — GC/CHLAMYDIA PROBE AMP
Chlamydia trachomatis, NAA: NEGATIVE
Neisseria Gonorrhoeae by PCR: NEGATIVE

## 2019-05-03 LAB — HGB SOLU + RFLX FRAC: Sickle Solubility Test - HGBRFX: NEGATIVE

## 2019-05-03 LAB — RPR: RPR Ser Ql: NONREACTIVE

## 2019-05-03 LAB — TSH: TSH: 2.01 u[IU]/mL (ref 0.450–4.500)

## 2019-05-03 LAB — NICOTINE SCREEN, URINE: Cotinine Ql Scrn, Ur: NEGATIVE ng/mL

## 2019-05-03 LAB — HIV ANTIBODY (ROUTINE TESTING W REFLEX): HIV Screen 4th Generation wRfx: NONREACTIVE

## 2019-05-03 LAB — HEMOGLOBIN A1C
Est. average glucose Bld gHb Est-mCnc: 88 mg/dL
Hgb A1c MFr Bld: 4.7 % — ABNORMAL LOW (ref 4.8–5.6)

## 2019-05-03 LAB — ANTIBODY SCREEN: Antibody Screen: NEGATIVE

## 2019-05-03 LAB — HEPATITIS B SURFACE ANTIGEN: Hepatitis B Surface Ag: NEGATIVE

## 2019-05-03 LAB — RUBELLA SCREEN: Rubella Antibodies, IGG: 3.03 index (ref >0.99)

## 2019-05-04 LAB — URINE CULTURE, OB REFLEX: Organism ID, Bacteria: NO GROWTH

## 2019-05-04 LAB — CULTURE, OB URINE

## 2019-05-10 DIAGNOSIS — N911 Secondary amenorrhea: Secondary | ICD-10-CM | POA: Diagnosis not present

## 2019-05-23 DIAGNOSIS — Z3685 Encounter for antenatal screening for Streptococcus B: Secondary | ICD-10-CM | POA: Diagnosis not present

## 2019-05-23 DIAGNOSIS — Z23 Encounter for immunization: Secondary | ICD-10-CM | POA: Diagnosis not present

## 2019-05-23 DIAGNOSIS — Z3481 Encounter for supervision of other normal pregnancy, first trimester: Secondary | ICD-10-CM | POA: Diagnosis not present

## 2019-05-23 DIAGNOSIS — Z31430 Encounter of female for testing for genetic disease carrier status for procreative management: Secondary | ICD-10-CM | POA: Diagnosis not present

## 2019-05-25 DIAGNOSIS — Z3481 Encounter for supervision of other normal pregnancy, first trimester: Secondary | ICD-10-CM | POA: Diagnosis not present

## 2019-05-25 DIAGNOSIS — Z3682 Encounter for antenatal screening for nuchal translucency: Secondary | ICD-10-CM | POA: Diagnosis not present

## 2019-05-25 DIAGNOSIS — Z3A12 12 weeks gestation of pregnancy: Secondary | ICD-10-CM | POA: Diagnosis not present

## 2019-05-26 ENCOUNTER — Encounter: Payer: Self-pay | Admitting: Obstetrics and Gynecology

## 2019-06-01 DIAGNOSIS — Z3401 Encounter for supervision of normal first pregnancy, first trimester: Secondary | ICD-10-CM | POA: Diagnosis not present

## 2019-06-01 DIAGNOSIS — Z331 Pregnant state, incidental: Secondary | ICD-10-CM | POA: Diagnosis not present

## 2019-07-04 DIAGNOSIS — Z3A18 18 weeks gestation of pregnancy: Secondary | ICD-10-CM | POA: Diagnosis not present

## 2019-07-04 DIAGNOSIS — Z363 Encounter for antenatal screening for malformations: Secondary | ICD-10-CM | POA: Diagnosis not present

## 2019-08-07 DIAGNOSIS — Z3A23 23 weeks gestation of pregnancy: Secondary | ICD-10-CM | POA: Diagnosis not present

## 2019-08-07 DIAGNOSIS — Z362 Encounter for other antenatal screening follow-up: Secondary | ICD-10-CM | POA: Diagnosis not present

## 2019-08-31 DIAGNOSIS — Z3480 Encounter for supervision of other normal pregnancy, unspecified trimester: Secondary | ICD-10-CM | POA: Diagnosis not present

## 2019-08-31 DIAGNOSIS — Z23 Encounter for immunization: Secondary | ICD-10-CM | POA: Diagnosis not present

## 2019-10-04 ENCOUNTER — Encounter: Payer: Self-pay | Admitting: Family Medicine

## 2019-10-04 DIAGNOSIS — Z3A31 31 weeks gestation of pregnancy: Secondary | ICD-10-CM | POA: Diagnosis not present

## 2019-10-04 DIAGNOSIS — O26843 Uterine size-date discrepancy, third trimester: Secondary | ICD-10-CM | POA: Diagnosis not present

## 2019-10-10 ENCOUNTER — Encounter: Payer: Self-pay | Admitting: Family Medicine

## 2019-10-11 ENCOUNTER — Other Ambulatory Visit: Payer: Self-pay

## 2019-10-11 MED ORDER — ESCITALOPRAM OXALATE 20 MG PO TABS: 20.0000 mg | ORAL_TABLET | Freq: Every day | ORAL | 0 refills | Status: DC

## 2019-10-11 NOTE — Telephone Encounter (Signed)
Patient is asking it to go to [id number] - [place], [place].

## 2019-10-12 MED ORDER — ESCITALOPRAM OXALATE 20 MG PO TABS: 20.0000 mg | ORAL_TABLET | Freq: Every day | ORAL | 0 refills | Status: DC

## 2019-10-12 NOTE — Addendum Note (Signed)
Addended by: Marian Sorrow on: 10/12/2019 01:58 PM   Modules accepted: Orders

## 2019-10-13 ENCOUNTER — Emergency Department (HOSPITAL_COMMUNITY)
Admission: EM | Admit: 2019-10-13 | Discharge: 2019-10-13 | Disposition: A | Discharge status: Home / Self Care | Payer: BC Managed Care – PPO | Attending: Emergency Medicine | Admitting: Emergency Medicine

## 2019-10-13 ENCOUNTER — Encounter (HOSPITAL_COMMUNITY): Payer: Self-pay | Admitting: *Deleted

## 2019-10-13 ENCOUNTER — Other Ambulatory Visit: Payer: Self-pay

## 2019-10-13 DIAGNOSIS — O1203 Gestational edema, third trimester: Secondary | ICD-10-CM | POA: Diagnosis not present

## 2019-10-13 DIAGNOSIS — M7989 Other specified soft tissue disorders: Secondary | ICD-10-CM | POA: Diagnosis not present

## 2019-10-13 DIAGNOSIS — J45909 Unspecified asthma, uncomplicated: Secondary | ICD-10-CM | POA: Insufficient documentation

## 2019-10-13 DIAGNOSIS — R2242 Localized swelling, mass and lump, left lower limb: Secondary | ICD-10-CM | POA: Diagnosis not present

## 2019-10-13 DIAGNOSIS — Z3A33 33 weeks gestation of pregnancy: Secondary | ICD-10-CM | POA: Diagnosis not present

## 2019-10-13 DIAGNOSIS — R600 Localized edema: Secondary | ICD-10-CM | POA: Diagnosis not present

## 2019-10-13 DIAGNOSIS — O1200 Gestational edema, unspecified trimester: Secondary | ICD-10-CM | POA: Diagnosis not present

## 2019-10-13 DIAGNOSIS — Z87891 Personal history of nicotine dependence: Secondary | ICD-10-CM | POA: Diagnosis not present

## 2019-10-13 DIAGNOSIS — Z3A00 Weeks of gestation of pregnancy not specified: Secondary | ICD-10-CM | POA: Diagnosis not present

## 2019-10-13 LAB — CBC
HCT: 28.5 % — ABNORMAL LOW (ref 36.0–46.0)
Hemoglobin: 9.4 g/dL — ABNORMAL LOW (ref 12.0–15.0)
MCH: 31.5 pg (ref 26.0–34.0)
MCHC: 33 g/dL (ref 30.0–36.0)
MCV: 95.6 fL (ref 80.0–100.0)
Platelets: 305 10*3/uL (ref 150–400)
RBC: 2.98 MIL/uL — ABNORMAL LOW (ref 3.87–5.11)
RDW: 12.3 % (ref 11.5–15.5)
WBC: 12.8 10*3/uL — ABNORMAL HIGH (ref 4.0–10.5)
nRBC: 0 % (ref 0.0–0.2)

## 2019-10-13 LAB — BASIC METABOLIC PANEL
Anion gap: 8 (ref 5–15)
BUN: 7 mg/dL (ref 6–20)
CO2: 21 mmol/L — ABNORMAL LOW (ref 22–32)
Calcium: 8.5 mg/dL — ABNORMAL LOW (ref 8.9–10.3)
Chloride: 106 mmol/L (ref 98–111)
Creatinine, Ser: 0.35 mg/dL — ABNORMAL LOW (ref 0.44–1.00)
GFR calc Af Amer: >60 mL/min (ref >60)
GFR calc non Af Amer: >60 mL/min (ref >60)
Glucose, Bld: 84 mg/dL (ref 70–99)
Potassium: 3.6 mmol/L (ref 3.5–5.1)
Sodium: 135 mmol/L (ref 135–145)

## 2019-10-13 NOTE — ED Provider Notes (Signed)
[facility] EMERGENCY DEPARTMENT Provider Note   CSN: [id number] Arrival date & time: 10/13/19  1926     History Chief Complaint  Patient presents with  . Leg Swelling    Tammy Knight is a 29 y.o. female.  HPI      Tammy Knight is a 29 y.o. pregnant  female G1P0, with EDD of August 13 who presents to the Emergency Department complaining of swelling of her left lower leg.  She noticed that her left knee has been hurting for several days, but denies injury.  Today, she states that she looked down and noticed that her left lower leg appears swollen.  She comes to the emergency department this evening concerned about a possible blood clot in her leg.  She does admit to recent job change in which she is sitting most of the day.  She denies any pregnancy related complaints and has routine prenatal care.  She denies recent injury, numbness of her leg or foot, rash, or weakness.  No chest pain or shortness of breath.  No history of DVT or PE.    Past Medical History:  Diagnosis Date  . Asthma    h/o no inhalers  . Chlamydia   . Depression   . Hay fever   . UTI (urinary tract infection)     Patient Active Problem List   Diagnosis Date Noted  . MDD (major depressive disorder) 03/31/2019  . Severe recurrent major depression without psychotic features ([place]) 03/31/2019  . Pregnancy 03/31/2019  . BMI 32.0-32.9,adult 06/09/2018  . Depression, major, single episode, in partial remission ([place]) 06/09/2018  . PMS (premenstrual syndrome) 12/13/2017  . Anxiety disorder 08/13/2017    Past Surgical History:  Procedure Laterality Date  . ABDOMINOPLASTY  2014  . LAPAROSCOPIC SALPINGO OOPHERECTOMY Right 06/13/2018   Procedure: LAPAROSCOPIC RIGHT  OOPHORECTOMY;  Surgeon: Harlin Heys, MD;  Location: [facility] ORS;  Service: Gynecology;  Laterality: Right;  . TONSILLECTOMY       OB History    Gravida  1   Para  0   Term  0   Preterm  0   AB  0   Living    0     SAB  0   TAB  0   Ectopic  0   Multiple  0   Live Births              Family History  Problem Relation Age of Onset  . Heart attack Father        2014  . Alcohol abuse Father   . Depression Father   . Hyperlipidemia Father   . Hypertension Father   . COPD Mother   . Glaucoma Mother   . Arthritis Mother   . Depression Mother   . Depression Sister   . Hearing loss Sister   . Diabetes Paternal Grandfather   . Breast cancer Maternal Aunt   . Ovarian cancer Neg Hx   . Colon cancer Neg Hx     Social History   Tobacco Use  . Smoking status: Former Smoker    Years: 5.00    Types: Cigarettes    Quit date: 01/07/2018    Years since quitting: 1.7  . Smokeless tobacco: Never Used  . Tobacco comment: 1-2 cig per day  Vaping Use  . Vaping Use: Never used  Substance Use Topics  . Alcohol use: Not Currently    Alcohol/week: 5.0 standard drinks    Types: 5 Glasses  of wine per week  . Drug use: No    Home Medications Prior to Admission medications   Medication Sig Start Date End Date Taking? Authorizing Provider  escitalopram (LEXAPRO) 20 MG tablet Take 1 tablet (20 mg total) by mouth daily. 10/12/19  Yes Vivi Barrack, MD  Prenatal Vit-Fe Fumarate-FA (PRENATAL 19) 29-1 MG CHEW Chew 2 each by mouth daily.   Yes [provider]    Allergies    Penicillins and Bee venom  Review of Systems   Review of Systems  Constitutional: Negative for chills and fever.  Respiratory: Negative for chest tightness and shortness of breath.   Cardiovascular: Negative for chest pain.  Genitourinary: Negative for difficulty urinating and dysuria.  Musculoskeletal: Positive for myalgias (swelling of left lower leg). Negative for arthralgias and joint swelling.  Skin: Negative for color change and wound.  Neurological: Negative for weakness and numbness.    Physical Exam Updated Vital Signs BP 126/75   Pulse 94   Temp 98.6 F (37 C) (Oral)   Resp 18   Ht 5'  5" (1.651 m)   Wt 115.2 kg   LMP 02/24/2019 (Exact Date)   SpO2 100%   BMI 42.27 kg/m   Physical Exam Vitals and nursing note reviewed.  Constitutional:      General: She is not in acute distress.    Appearance: Normal appearance.  Cardiovascular:     Rate and Rhythm: Normal rate and regular rhythm.     Pulses: Normal pulses.  Pulmonary:     Effort: Pulmonary effort is normal.  Abdominal:     Comments: Gravid abdomen.  Non tender  Musculoskeletal:        General: Tenderness present. No signs of injury. Normal range of motion.     Right lower leg: No edema.     Left lower leg: No edema.     Comments: ttp of the left anterior knee.  No tenderness with valgus or varus stress.  No palpable effusion.   No erythema or excessive warmth of the joint.  Mild ttp of the posterior left lower leg.  No palpable cord  Skin:    General: Skin is warm.     Capillary Refill: Capillary refill takes less than 2 seconds.     Findings: No erythema or rash.  Neurological:     General: No focal deficit present.     Mental Status: She is alert.     Sensory: No sensory deficit.     Motor: No weakness.     ED Results / Procedures / Treatments   Labs (all labs ordered are listed, but only abnormal results are displayed) Labs Reviewed  CBC - Abnormal; Notable for the following components:      Result Value   WBC 12.8 (*)    RBC 2.98 (*)    Hemoglobin 9.4 (*)    HCT 28.5 (*)    All other components within normal limits  BASIC METABOLIC PANEL - Abnormal; Notable for the following components:   CO2 21 (*)    Creatinine, Ser 0.35 (*)    Calcium 8.5 (*)    All other components within normal limits    EKG None  Radiology No results found.  Procedures Procedures (including critical care time)  Medications Ordered in ED Medications - No data to display  ED Course  I have reviewed the triage vital signs and the nursing notes.  Pertinent labs & imaging results that were available during  my care  of the patient were reviewed by me and considered in my medical decision making (see chart for details).    MDM Rules/Calculators/A&P                          Patient here with complaints of swelling and pain of the left lower extremity.  She is approximately [redacted] weeks pregnant.  No appreciable edema of the extremity on my exam.  Posterior tibial and dorsalis pedis pulses are palpable.  I have scheduled patient to return tomorrow morning at 9 AM for outpatient ultrasound of the left lower extremity. She agrees to plan.  Clinical suspicion for DVT is low.  No concerning sx's for PE   Final Clinical Impression(s) / ED Diagnoses Final diagnoses:  None    Rx / DC Orders ED Discharge Orders         Ordered    US Venous Img Lower Unilateral Left     Discontinue     10/13/19 2237           Kem Parkinson, PA-C 10/16/19 1525    Fredia Sorrow, MD 10/18/19 [phone]

## 2019-10-13 NOTE — ED Triage Notes (Signed)
Pt c/o left leg swelling and pain to left knee that started today, denies any injury, pt is currently pregnant with due date of December 01 2019

## 2019-10-13 NOTE — Discharge Instructions (Signed)
You are scheduled to return here tomorrow morning for an ultrasound of your left leg.  Your appointment time is 9 AM.  Please try to arrive 10 to 15 minutes early to register.  Follow-up with your OB/GYN as scheduled.

## 2019-10-14 ENCOUNTER — Emergency Department (HOSPITAL_COMMUNITY)
Admission: RE | Admit: 2019-10-14 | Discharge: 2019-10-14 | Disposition: A | Discharge status: Home / Self Care | Payer: BC Managed Care – PPO | Source: Ambulatory Visit | Attending: Emergency Medicine | Admitting: Emergency Medicine

## 2019-10-14 DIAGNOSIS — O1203 Gestational edema, third trimester: Secondary | ICD-10-CM | POA: Diagnosis not present

## 2019-10-14 DIAGNOSIS — R600 Localized edema: Secondary | ICD-10-CM | POA: Diagnosis not present

## 2019-10-14 DIAGNOSIS — Z3A33 33 weeks gestation of pregnancy: Secondary | ICD-10-CM | POA: Diagnosis not present

## 2019-10-14 NOTE — ED Provider Notes (Signed)
Patient presented to the ER with concerns of her left leg swelling, at [redacted] weeks pregnant.  Previous provider had ordered DVT ultrasound of the left lower leg.  I personally reviewed her previous work-up, and DVT study which was negative.  She denies any new or worsening symptoms.  I explained these findings to the patient, who is overall reassured.  Return precautions given.  She has pending follow-up with her OB/GYN this coming week.  At this stage in the ED course, the patient has been medically screened and is stable for discharge.   Garald Balding, PA-C 10/14/19 4175    Isla Pence, MD 10/14/19 1500

## 2019-11-01 DIAGNOSIS — Z3480 Encounter for supervision of other normal pregnancy, unspecified trimester: Secondary | ICD-10-CM | POA: Diagnosis not present

## 2019-11-01 DIAGNOSIS — Z3685 Encounter for antenatal screening for Streptococcus B: Secondary | ICD-10-CM | POA: Diagnosis not present

## 2019-11-01 LAB — OB RESULTS CONSOLE GBS: GBS: NEGATIVE

## 2019-11-08 DIAGNOSIS — O26843 Uterine size-date discrepancy, third trimester: Secondary | ICD-10-CM | POA: Diagnosis not present

## 2019-11-08 DIAGNOSIS — Z3A36 36 weeks gestation of pregnancy: Secondary | ICD-10-CM | POA: Diagnosis not present

## 2019-11-13 ENCOUNTER — Telehealth (HOSPITAL_COMMUNITY): Payer: Self-pay | Admitting: *Deleted

## 2019-11-13 NOTE — Telephone Encounter (Signed)
Preadmission screen  

## 2019-11-16 ENCOUNTER — Telehealth (HOSPITAL_COMMUNITY): Payer: Self-pay | Admitting: *Deleted

## 2019-11-16 ENCOUNTER — Encounter (HOSPITAL_COMMUNITY): Payer: Self-pay | Admitting: *Deleted

## 2019-11-16 NOTE — Telephone Encounter (Signed)
Preadmission screen  

## 2019-11-22 ENCOUNTER — Other Ambulatory Visit (HOSPITAL_COMMUNITY)
Admission: RE | Admit: 2019-11-22 | Discharge: 2019-11-22 | Disposition: A | Discharge status: Home / Self Care | Payer: BC Managed Care – PPO | Source: Ambulatory Visit | Attending: Obstetrics and Gynecology | Admitting: Obstetrics and Gynecology

## 2019-11-22 DIAGNOSIS — O26893 Other specified pregnancy related conditions, third trimester: Secondary | ICD-10-CM | POA: Diagnosis not present

## 2019-11-22 DIAGNOSIS — O9902 Anemia complicating childbirth: Secondary | ICD-10-CM | POA: Diagnosis not present

## 2019-11-22 DIAGNOSIS — D649 Anemia, unspecified: Secondary | ICD-10-CM | POA: Diagnosis not present

## 2019-11-22 DIAGNOSIS — Z87891 Personal history of nicotine dependence: Secondary | ICD-10-CM | POA: Diagnosis not present

## 2019-11-22 DIAGNOSIS — Z880 Allergy status to penicillin: Secondary | ICD-10-CM | POA: Diagnosis not present

## 2019-11-22 DIAGNOSIS — Z3A39 39 weeks gestation of pregnancy: Secondary | ICD-10-CM | POA: Diagnosis not present

## 2019-11-22 DIAGNOSIS — O99214 Obesity complicating childbirth: Secondary | ICD-10-CM | POA: Diagnosis not present

## 2019-11-22 DIAGNOSIS — Z01812 Encounter for preprocedural laboratory examination: Secondary | ICD-10-CM | POA: Insufficient documentation

## 2019-11-22 DIAGNOSIS — Z20822 Contact with and (suspected) exposure to covid-19: Secondary | ICD-10-CM | POA: Diagnosis not present

## 2019-11-22 LAB — SARS CORONAVIRUS 2 (TAT 6-24 HRS): SARS Coronavirus 2: NEGATIVE

## 2019-11-23 NOTE — H&P (Signed)
Tammy Knight is a 29 y.o. female presenting for two stage IOL at term. Pregnancy complicated by Hx of abdominoplasty. Panorama normal female. U/S 11/08/19 noted Vtx, EFW 7# 11oz (92%), posterior placenta. OB History    Gravida  1   Para  0   Term  0   Preterm  0   AB  0   Living  0     SAB  0   TAB  0   Ectopic  0   Multiple  0   Live Births             Past Medical History:  Diagnosis Date  . Asthma    h/o no inhalers  . Chlamydia   . Depression   . Hay fever   . UTI (urinary tract infection)    Past Surgical History:  Procedure Laterality Date  . ABDOMINOPLASTY  2014  . LAPAROSCOPIC SALPINGO OOPHERECTOMY Right 06/13/2018   Procedure: LAPAROSCOPIC RIGHT  OOPHORECTOMY;  Surgeon: Harlin Heys, MD;  Location: [facility] ORS;  Service: Gynecology;  Laterality: Right;  . TONSILLECTOMY     Family History: family history includes Alcohol abuse in her father; Arthritis in her mother; Birth defects in her sister; Breast cancer in her maternal aunt; COPD in her mother; Depression in her father, mother, and sister; Diabetes in her paternal grandfather; Glaucoma in her mother; Hearing loss in her sister; Heart attack in her father; Hyperlipidemia in her father; Hypertension in her father. Social History:  reports that she quit smoking about 22 months ago. Her smoking use included cigarettes. She quit after 5.00 years of use. She has never used smokeless tobacco. She reports previous alcohol use of about 5.0 standard drinks of alcohol per week. She reports that she does not use drugs.     Maternal Diabetes: No Genetic Screening: Normal Maternal Ultrasounds/Referrals: Normal Fetal Ultrasounds or other Referrals:  None Maternal Substance Abuse:  No Significant Maternal Medications:  None Significant Maternal Lab Results:  Group B Strep negative Other Comments:  None  Review of Systems  Eyes: Negative for visual disturbance.  Gastrointestinal: Negative for  abdominal pain.  Neurological: Negative for headaches.   Maternal Medical History:  Fetal activity: Perceived fetal activity is normal.        Last menstrual period 02/24/2019. Maternal Exam:  Abdomen: Fetal presentation: vertex     Physical Exam Cardiovascular:     Rate and Rhythm: Normal rate.  Pulmonary:     Effort: Pulmonary effort is normal.     Prenatal labs: ABO, Rh: A/Positive/-- (01/12 8875) Antibody: Negative (01/12 0938) Rubella: 3.03 (01/12 0938) RPR: Non Reactive (01/12 0938)  HBsAg: Negative (01/12 7972)  HIV: Non Reactive (01/12 8206)  GBS:   negative 11/01/19  Assessment/Plan: 29 yo G1P0 for two stage IOL.   Shon Millet II 11/23/2019, 7:26 PM

## 2019-11-24 ENCOUNTER — Inpatient Hospital Stay (HOSPITAL_COMMUNITY): Payer: BC Managed Care – PPO | Admitting: Anesthesiology

## 2019-11-24 ENCOUNTER — Other Ambulatory Visit: Payer: Self-pay

## 2019-11-24 ENCOUNTER — Inpatient Hospital Stay (HOSPITAL_COMMUNITY)
Admission: AD | Admit: 2019-11-24 | Discharge: 2019-11-26 | DRG: 788 | Disposition: A | Discharge status: Home / Self Care | Payer: BC Managed Care – PPO | Attending: Obstetrics and Gynecology | Admitting: Obstetrics and Gynecology

## 2019-11-24 ENCOUNTER — Encounter (HOSPITAL_COMMUNITY): Payer: Self-pay | Admitting: Obstetrics and Gynecology

## 2019-11-24 ENCOUNTER — Encounter (HOSPITAL_COMMUNITY)
Admission: AD | Disposition: A | Discharge status: Home / Self Care | Payer: Self-pay | Source: Home / Self Care | Attending: Obstetrics and Gynecology

## 2019-11-24 ENCOUNTER — Inpatient Hospital Stay (HOSPITAL_COMMUNITY): Payer: BC Managed Care – PPO

## 2019-11-24 DIAGNOSIS — O99214 Obesity complicating childbirth: Principal | ICD-10-CM | POA: Diagnosis present

## 2019-11-24 DIAGNOSIS — Z3A39 39 weeks gestation of pregnancy: Secondary | ICD-10-CM

## 2019-11-24 DIAGNOSIS — Z87891 Personal history of nicotine dependence: Secondary | ICD-10-CM | POA: Diagnosis not present

## 2019-11-24 DIAGNOSIS — Z880 Allergy status to penicillin: Secondary | ICD-10-CM | POA: Diagnosis not present

## 2019-11-24 DIAGNOSIS — Z20822 Contact with and (suspected) exposure to covid-19: Secondary | ICD-10-CM | POA: Diagnosis present

## 2019-11-24 DIAGNOSIS — O26893 Other specified pregnancy related conditions, third trimester: Secondary | ICD-10-CM | POA: Diagnosis present

## 2019-11-24 DIAGNOSIS — Z3490 Encounter for supervision of normal pregnancy, unspecified, unspecified trimester: Secondary | ICD-10-CM

## 2019-11-24 LAB — CBC
HCT: 32.3 % — ABNORMAL LOW (ref 36.0–46.0)
Hemoglobin: 10.5 g/dL — ABNORMAL LOW (ref 12.0–15.0)
MCH: 29.4 pg (ref 26.0–34.0)
MCHC: 32.5 g/dL (ref 30.0–36.0)
MCV: 90.5 fL (ref 80.0–100.0)
Platelets: 315 10*3/uL (ref 150–400)
RBC: 3.57 MIL/uL — ABNORMAL LOW (ref 3.87–5.11)
RDW: 13.3 % (ref 11.5–15.5)
WBC: 10.1 10*3/uL (ref 4.0–10.5)
nRBC: 0 % (ref 0.0–0.2)

## 2019-11-24 LAB — TYPE AND SCREEN
ABO/RH(D): A POS
Antibody Screen: NEGATIVE

## 2019-11-24 LAB — RPR: RPR Ser Ql: NONREACTIVE

## 2019-11-24 SURGERY — Surgical Case
Anesthesia: Epidural

## 2019-11-24 MED ORDER — OXYTOCIN-SODIUM CHLORIDE 30-0.9 UT/500ML-% IV SOLN
INTRAVENOUS | Status: AC
  Filled 2019-11-24: qty 500

## 2019-11-24 MED ORDER — DIPHENHYDRAMINE HCL 50 MG/ML IJ SOLN: 12.5000 mg | INTRAMUSCULAR | Status: DC | PRN

## 2019-11-24 MED ORDER — ONDANSETRON HCL 4 MG/2ML IJ SOLN
INTRAMUSCULAR | Status: AC
  Filled 2019-11-24: qty 2

## 2019-11-24 MED ORDER — HYDROMORPHONE HCL 1 MG/ML IJ SOLN: 0.2500 mg | INTRAMUSCULAR | Status: DC | PRN

## 2019-11-24 MED ORDER — CLINDAMYCIN PHOSPHATE 900 MG/50ML IV SOLN
INTRAVENOUS | Status: DC | PRN
Start: 2019-11-24 — End: 2019-11-24
  Administered 2019-11-24: 900 mg via INTRAVENOUS

## 2019-11-24 MED ORDER — SODIUM CHLORIDE 0.9 % IV SOLN
INTRAVENOUS | Status: DC | PRN
Start: 2019-11-24 — End: 2019-11-24

## 2019-11-24 MED ORDER — SODIUM CHLORIDE 0.9 % IR SOLN
Status: DC | PRN
  Administered 2019-11-24: 1

## 2019-11-24 MED ORDER — OXYTOCIN BOLUS FROM INFUSION: 333.0000 mL | Freq: Once | INTRAVENOUS | Status: DC

## 2019-11-24 MED ORDER — EPHEDRINE 5 MG/ML INJ: 10.0000 mg | INTRAVENOUS | Status: DC | PRN

## 2019-11-24 MED ORDER — OXYTOCIN-SODIUM CHLORIDE 30-0.9 UT/500ML-% IV SOLN
INTRAVENOUS | Status: DC | PRN
  Administered 2019-11-24: 30 [IU] via INTRAVENOUS

## 2019-11-24 MED ORDER — CLINDAMYCIN PHOSPHATE 900 MG/50ML IV SOLN: 900.0000 mg | INTRAVENOUS | Status: DC

## 2019-11-24 MED ORDER — NALBUPHINE HCL 10 MG/ML IJ SOLN: 5.0000 mg | Freq: Once | INTRAMUSCULAR | Status: DC | PRN

## 2019-11-24 MED ORDER — ONDANSETRON HCL 4 MG/2ML IJ SOLN: 4.0000 mg | Freq: Once | INTRAMUSCULAR | Status: DC | PRN

## 2019-11-24 MED ORDER — LACTATED RINGERS IV SOLN: INTRAVENOUS | Status: DC

## 2019-11-24 MED ORDER — FENTANYL CITRATE (PF) 100 MCG/2ML IJ SOLN
INTRAMUSCULAR | Status: DC | PRN
  Administered 2019-11-24: 100 ug via EPIDURAL

## 2019-11-24 MED ORDER — LIDOCAINE HCL (PF) 1 % IJ SOLN: 30.0000 mL | INTRAMUSCULAR | Status: DC | PRN

## 2019-11-24 MED ORDER — DIPHENHYDRAMINE HCL 25 MG PO CAPS
25.0000 mg | ORAL_CAPSULE | ORAL | Status: DC | PRN
  Administered 2019-11-25: 25 mg via ORAL
  Filled 2019-11-24: qty 1

## 2019-11-24 MED ORDER — SODIUM CHLORIDE 0.9% FLUSH: 3.0000 mL | INTRAVENOUS | Status: DC | PRN

## 2019-11-24 MED ORDER — LACTATED RINGERS IV SOLN: 500.0000 mL | Freq: Once | INTRAVENOUS | Status: DC

## 2019-11-24 MED ORDER — ONDANSETRON HCL 4 MG/2ML IJ SOLN: 4.0000 mg | Freq: Three times a day (TID) | INTRAMUSCULAR | Status: DC | PRN

## 2019-11-24 MED ORDER — OXYCODONE HCL 5 MG/5ML PO SOLN: 5.0000 mg | Freq: Once | ORAL | Status: DC | PRN

## 2019-11-24 MED ORDER — ONDANSETRON HCL 4 MG/2ML IJ SOLN
INTRAMUSCULAR | Status: DC | PRN
  Administered 2019-11-24: 4 mg via INTRAVENOUS

## 2019-11-24 MED ORDER — HYDROXYZINE HCL 50 MG PO TABS: 50.0000 mg | ORAL_TABLET | Freq: Four times a day (QID) | ORAL | Status: DC | PRN

## 2019-11-24 MED ORDER — OXYCODONE HCL 5 MG PO TABS: 5.0000 mg | ORAL_TABLET | Freq: Once | ORAL | Status: DC | PRN

## 2019-11-24 MED ORDER — ACETAMINOPHEN 325 MG PO TABS: 650.0000 mg | ORAL_TABLET | ORAL | Status: DC | PRN

## 2019-11-24 MED ORDER — FENTANYL CITRATE (PF) 100 MCG/2ML IJ SOLN
INTRAMUSCULAR | Status: AC
  Filled 2019-11-24: qty 2

## 2019-11-24 MED ORDER — OXYCODONE-ACETAMINOPHEN 5-325 MG PO TABS: 1.0000 | ORAL_TABLET | ORAL | Status: DC | PRN

## 2019-11-24 MED ORDER — SOD CITRATE-CITRIC ACID 500-334 MG/5ML PO SOLN: 30.0000 mL | ORAL | Status: DC

## 2019-11-24 MED ORDER — LACTATED RINGERS IV SOLN: 500.0000 mL | INTRAVENOUS | Status: DC | PRN

## 2019-11-24 MED ORDER — LIDOCAINE-EPINEPHRINE (PF) 2 %-1:200000 IJ SOLN
INTRAMUSCULAR | Status: DC | PRN
  Administered 2019-11-24 (×2): 5 mL via EPIDURAL
  Administered 2019-11-24: 4 mL via EPIDURAL

## 2019-11-24 MED ORDER — NALOXONE HCL 4 MG/10ML IJ SOLN: 1.0000 ug/kg/h | INTRAVENOUS | Status: DC | PRN

## 2019-11-24 MED ORDER — OXYCODONE-ACETAMINOPHEN 5-325 MG PO TABS: 2.0000 | ORAL_TABLET | ORAL | Status: DC | PRN

## 2019-11-24 MED ORDER — FLEET ENEMA 7-19 GM/118ML RE ENEM: 1.0000 | ENEMA | RECTAL | Status: DC | PRN

## 2019-11-24 MED ORDER — KETOROLAC TROMETHAMINE 30 MG/ML IJ SOLN: 30.0000 mg | Freq: Once | INTRAMUSCULAR | Status: AC | PRN

## 2019-11-24 MED ORDER — CLINDAMYCIN PHOSPHATE 900 MG/50ML IV SOLN
INTRAVENOUS | Status: AC
  Filled 2019-11-24: qty 50

## 2019-11-24 MED ORDER — TERBUTALINE SULFATE 1 MG/ML IJ SOLN: 0.2500 mg | Freq: Once | INTRAMUSCULAR | Status: DC | PRN

## 2019-11-24 MED ORDER — PHENYLEPHRINE 40 MCG/ML (10ML) SYRINGE FOR IV PUSH (FOR BLOOD PRESSURE SUPPORT)
80.0000 ug | PREFILLED_SYRINGE | INTRAVENOUS | Status: DC | PRN
  Filled 2019-11-24: qty 10

## 2019-11-24 MED ORDER — LACTATED RINGERS IV SOLN
INTRAVENOUS | Status: DC | PRN
Start: 2019-11-24 — End: 2019-11-24

## 2019-11-24 MED ORDER — OXYTOCIN-SODIUM CHLORIDE 30-0.9 UT/500ML-% IV SOLN
1.0000 m[IU]/min | INTRAVENOUS | Status: DC
  Administered 2019-11-24: 2 m[IU]/min via INTRAVENOUS

## 2019-11-24 MED ORDER — SODIUM CHLORIDE (PF) 0.9 % IJ SOLN
INTRAMUSCULAR | Status: DC | PRN
  Administered 2019-11-24: 12 mL/h via EPIDURAL

## 2019-11-24 MED ORDER — SOD CITRATE-CITRIC ACID 500-334 MG/5ML PO SOLN
30.0000 mL | ORAL | Status: DC | PRN
  Administered 2019-11-24: 30 mL via ORAL
  Filled 2019-11-24: qty 30

## 2019-11-24 MED ORDER — NALOXONE HCL 0.4 MG/ML IJ SOLN: 0.4000 mg | INTRAMUSCULAR | Status: DC | PRN

## 2019-11-24 MED ORDER — NALBUPHINE HCL 10 MG/ML IJ SOLN: 5.0000 mg | INTRAMUSCULAR | Status: DC | PRN

## 2019-11-24 MED ORDER — SCOPOLAMINE 1 MG/3DAYS TD PT72: 1.0000 | MEDICATED_PATCH | Freq: Once | TRANSDERMAL | Status: DC

## 2019-11-24 MED ORDER — MISOPROSTOL 25 MCG QUARTER TABLET
25.0000 ug | ORAL_TABLET | ORAL | Status: DC | PRN
  Administered 2019-11-24 (×2): 25 ug via VAGINAL
  Filled 2019-11-24 (×2): qty 1

## 2019-11-24 MED ORDER — ESCITALOPRAM OXALATE 20 MG PO TABS
20.0000 mg | ORAL_TABLET | Freq: Every day | ORAL | Status: DC
  Administered 2019-11-24: 20 mg via ORAL
  Filled 2019-11-24: qty 1

## 2019-11-24 MED ORDER — GENTAMICIN SULFATE 40 MG/ML IJ SOLN
5.0000 mg/kg | INTRAVENOUS | Status: AC
  Administered 2019-11-24: 410 mg via INTRAVENOUS
  Filled 2019-11-24: qty 10.25

## 2019-11-24 MED ORDER — FENTANYL-BUPIVACAINE-NACL 0.5-0.125-0.9 MG/250ML-% EP SOLN
EPIDURAL | Status: AC
  Filled 2019-11-24: qty 250

## 2019-11-24 MED ORDER — ONDANSETRON HCL 4 MG/2ML IJ SOLN: 4.0000 mg | Freq: Four times a day (QID) | INTRAMUSCULAR | Status: DC | PRN

## 2019-11-24 MED ORDER — STERILE WATER FOR IRRIGATION IR SOLN
Status: DC | PRN
  Administered 2019-11-24: 1

## 2019-11-24 MED ORDER — MORPHINE SULFATE (PF) 0.5 MG/ML IJ SOLN
INTRAMUSCULAR | Status: AC
  Filled 2019-11-24: qty 10

## 2019-11-24 MED ORDER — POVIDONE-IODINE 10 % EX SWAB: 2.0000 "application " | Freq: Once | CUTANEOUS | Status: DC

## 2019-11-24 MED ORDER — FENTANYL-BUPIVACAINE-NACL 0.5-0.125-0.9 MG/250ML-% EP SOLN: 12.0000 mL/h | EPIDURAL | Status: DC | PRN

## 2019-11-24 MED ORDER — OXYTOCIN-SODIUM CHLORIDE 30-0.9 UT/500ML-% IV SOLN
2.5000 [IU]/h | INTRAVENOUS | Status: DC
  Filled 2019-11-24: qty 500

## 2019-11-24 MED ORDER — MORPHINE SULFATE (PF) 0.5 MG/ML IJ SOLN
INTRAMUSCULAR | Status: DC | PRN
  Administered 2019-11-24: 3 mg via EPIDURAL

## 2019-11-24 MED ORDER — FENTANYL CITRATE (PF) 100 MCG/2ML IJ SOLN: 50.0000 ug | INTRAMUSCULAR | Status: DC | PRN

## 2019-11-24 MED ORDER — PHENYLEPHRINE 40 MCG/ML (10ML) SYRINGE FOR IV PUSH (FOR BLOOD PRESSURE SUPPORT): 80.0000 ug | PREFILLED_SYRINGE | INTRAVENOUS | Status: DC | PRN

## 2019-11-24 MED ORDER — LIDOCAINE HCL (PF) 1 % IJ SOLN
INTRAMUSCULAR | Status: DC | PRN
  Administered 2019-11-24 (×2): 4 mL via EPIDURAL

## 2019-11-24 SURGICAL SUPPLY — 39 items
BENZOIN TINCTURE PRP APPL 2/3 (GAUZE/BANDAGES/DRESSINGS) ×2 IMPLANT
CHLORAPREP W/TINT 26ML (MISCELLANEOUS) ×2 IMPLANT
CLAMP CORD UMBIL (MISCELLANEOUS) IMPLANT
CLOSURE STERI STRIP 1/2 X4 (GAUZE/BANDAGES/DRESSINGS) ×2 IMPLANT
CLOTH BEACON ORANGE TIMEOUT ST (SAFETY) ×2 IMPLANT
DERMABOND ADVANCED (GAUZE/BANDAGES/DRESSINGS)
DERMABOND ADVANCED .7 DNX12 (GAUZE/BANDAGES/DRESSINGS) IMPLANT
DRSG OPSITE POSTOP 4X10 (GAUZE/BANDAGES/DRESSINGS) ×2 IMPLANT
DRSG OPSITE POSTOP 4X14 (GAUZE/BANDAGES/DRESSINGS) ×2 IMPLANT
ELECT REM PT RETURN 9FT ADLT (ELECTROSURGICAL) ×2
ELECTRODE REM PT RTRN 9FT ADLT (ELECTROSURGICAL) ×1 IMPLANT
EXTRACTOR VACUUM M CUP 4 TUBE (SUCTIONS) IMPLANT
GAUZE SPONGE 4X4 12PLY STRL LF (GAUZE/BANDAGES/DRESSINGS) ×4 IMPLANT
GLOVE BIO SURGEON STRL SZ7.5 (GLOVE) ×2 IMPLANT
GLOVE BIOGEL PI IND STRL 7.0 (GLOVE) ×1 IMPLANT
GLOVE BIOGEL PI INDICATOR 7.0 (GLOVE) ×1
GOWN STRL REUS W/TWL LRG LVL3 (GOWN DISPOSABLE) ×4 IMPLANT
KIT ABG SYR 3ML LUER SLIP (SYRINGE) ×2 IMPLANT
NEEDLE HYPO 25X5/8 SAFETYGLIDE (NEEDLE) ×2 IMPLANT
NS IRRIG 1000ML POUR BTL (IV SOLUTION) ×2 IMPLANT
PACK C SECTION WH (CUSTOM PROCEDURE TRAY) ×2 IMPLANT
PAD ABD 7.5X8 STRL (GAUZE/BANDAGES/DRESSINGS) ×2 IMPLANT
PAD ABD DERMACEA PRESS 5X9 (GAUZE/BANDAGES/DRESSINGS) ×2 IMPLANT
PAD OB MATERNITY 4.3X12.25 (PERSONAL CARE ITEMS) ×2 IMPLANT
PENCIL SMOKE EVAC W/HOLSTER (ELECTROSURGICAL) ×2 IMPLANT
STRIP CLOSURE SKIN 1/2X4 (GAUZE/BANDAGES/DRESSINGS) IMPLANT
SUT CHROMIC 2 0 SH (SUTURE) ×2 IMPLANT
SUT MNCRL 0 VIOLET CTX 36 (SUTURE) ×5 IMPLANT
SUT MONOCRYL 0 CTX 36 (SUTURE) ×5
SUT PDS AB 0 CTX 36 PDP370T (SUTURE) ×2 IMPLANT
SUT PDS AB 0 CTX 60 (SUTURE) ×4 IMPLANT
SUT PLAIN 0 NONE (SUTURE) IMPLANT
SUT PLAIN 2 0 (SUTURE)
SUT PLAIN 2 0 XLH (SUTURE) IMPLANT
SUT PLAIN ABS 2-0 CT1 27XMFL (SUTURE) IMPLANT
SUT VIC AB 4-0 KS 27 (SUTURE) ×2 IMPLANT
TOWEL OR 17X24 6PK STRL BLUE (TOWEL DISPOSABLE) ×2 IMPLANT
TRAY FOLEY W/BAG SLVR 14FR LF (SET/KITS/TRAYS/PACK) ×2 IMPLANT
WATER STERILE IRR 1000ML POUR (IV SOLUTION) ×2 IMPLANT

## 2019-11-24 NOTE — Anesthesia Preprocedure Evaluation (Signed)
Anesthesia Evaluation  Patient identified by MRN, date of birth, ID band Patient awake    Reviewed: Allergy & Precautions, Patient's Chart, lab work & pertinent test results  Airway Mallampati: II  TM Distance: >3 FB Neck ROM: Full    Dental no notable dental hx. (+) Teeth Intact   Pulmonary asthma , former smoker,    Pulmonary exam normal breath sounds clear to auscultation       Cardiovascular negative cardio ROS Normal cardiovascular exam Rhythm:Regular Rate:Normal     Neuro/Psych PSYCHIATRIC DISORDERS Anxiety Depression negative neurological ROS     GI/Hepatic Neg liver ROS, GERD  Medicated,  Endo/Other  Morbid obesity  Renal/GU negative Renal ROS  negative genitourinary   Musculoskeletal negative musculoskeletal ROS (+)   Abdominal (+) + obese,   Peds  Hematology  (+) anemia ,   Anesthesia Other Findings   Reproductive/Obstetrics (+) Pregnancy                             Anesthesia Physical Anesthesia Plan  ASA: III  Anesthesia Plan: Epidural   Post-op Pain Management:    Induction:   PONV Risk Score and Plan:   Airway Management Planned: Natural Airway  Additional Equipment:   Intra-op Plan:   Post-operative Plan:   Informed Consent: I have reviewed the patients History and Physical, chart, labs and discussed the procedure including the risks, benefits and alternatives for the proposed anesthesia with the patient or authorized representative who has indicated his/her understanding and acceptance.       Plan Discussed with: Anesthesiologist  Anesthesia Plan Comments:         Anesthesia Quick Evaluation

## 2019-11-24 NOTE — Brief Op Note (Signed)
11/24/2019  11:12 PM  PATIENT:  Tammy Knight  29 y.o. female  PRE-OPERATIVE DIAGNOSIS:  Arrest of dilation  POST-OPERATIVE DIAGNOSIS:  Same  PROCEDURE:  Procedure(s): CESAREAN SECTION (N/A)  SURGEON:  Surgeon(s) and Role:    * Everlene Farrier, MD - Primary  PHYSICIAN ASSISTANT:   ASSISTANTS: none   ANESTHESIA:   epidural  EBL:  500 ml   BLOOD ADMINISTERED:none  DRAINS: Urinary Catheter (Foley)   LOCAL MEDICATIONS USED:  NONE  SPECIMEN:  No Specimen  DISPOSITION OF SPECIMEN:  N/A  COUNTS:  YES  TOURNIQUET:  * No tourniquets in log *  DICTATION: .Other Dictation: Dictation Number [id number]  PLAN OF CARE: Admit to inpatient   PATIENT DISPOSITION:  PACU - hemodynamically stable.   Delay start of Pharmacological VTE agent (>24hrs) due to surgical blood loss or risk of bleeding: not applicable

## 2019-11-24 NOTE — Progress Notes (Signed)
IUPC placed about 1 hour ago>labor adequate Cx no change A/P: Arrest of dilation         Rec: C/S. D/W procedure and risks including infection, organ damage, bleeding/transfusion-HIV/Hep, DVT/PE, pneumonia, wound breakdown. Patient states she understands and agrees.

## 2019-11-24 NOTE — Transfer of Care (Signed)
Immediate Anesthesia Transfer of Care Note  Patient: Tammy Knight  Procedure(s) Performed: CESAREAN SECTION (N/A )  Patient Location: PACU  Anesthesia Type:Epidural  Level of Consciousness: awake, alert  and oriented  Airway & Oxygen Therapy: Patient Spontanous Breathing  Post-op Assessment: Report given to RN and Post -op Vital signs reviewed and stable  Post vital signs: Reviewed and stable  Last Vitals:  Vitals Value Taken Time  BP 118/71 11/24/19 2325  Temp    Pulse 106 11/24/19 2327  Resp 24 11/24/19 2327  SpO2 93 % 11/24/19 2327  Vitals shown include unvalidated device data.  Last Pain:  Vitals:   11/24/19 1206  TempSrc: Oral         Complications: No complications documented.

## 2019-11-24 NOTE — Progress Notes (Signed)
FHT cat one UCs q2-3 min Cx 2/80/-2/vtx AROM clear

## 2019-11-24 NOTE — Anesthesia Procedure Notes (Signed)
Epidural Patient location during procedure: OB Start time: 11/24/2019 3:57 PM End time: 11/24/2019 4:05 PM  Staffing Anesthesiologist: Josephine Igo, MD Performed: anesthesiologist   Preanesthetic Checklist Completed: patient identified, IV checked, site marked, risks and benefits discussed, surgical consent, monitors and equipment checked, pre-op evaluation and timeout performed  Epidural Patient position: sitting Prep: DuraPrep and site prepped and draped Patient monitoring: continuous pulse ox and blood pressure Approach: midline Location: L3-L4 Injection technique: LOR air  Needle:  Needle type: Tuohy  Needle gauge: 17 G Needle length: 9 cm and 9 Needle insertion depth: 6 cm Catheter type: closed end flexible Catheter size: 19 Gauge Catheter at skin depth: 11 cm Test dose: negative and Other  Assessment Events: blood not aspirated, injection not painful, no injection resistance, no paresthesia and negative IV test  Additional Notes Patient identified. Risks and benefits discussed including failed block, incomplete  Pain control, post dural puncture headache, nerve damage, paralysis, blood pressure Changes, nausea, vomiting, reactions to medications-both toxic and allergic and post Partum back pain. All questions were answered. Patient expressed understanding and wished to proceed. Sterile technique was used throughout procedure. Epidural site was Dressed with sterile barrier dressing. No paresthesias, signs of intravascular injection Or signs of intrathecal spread were encountered.  Patient was more comfortable after the epidural was dosed. Please see RN's note for documentation of vital signs and FHR which are stable. Reason for block:procedure for pain

## 2019-11-25 ENCOUNTER — Encounter (HOSPITAL_COMMUNITY): Payer: Self-pay | Admitting: Obstetrics and Gynecology

## 2019-11-25 LAB — CBC
HCT: 30.1 % — ABNORMAL LOW (ref 36.0–46.0)
Hemoglobin: 9.7 g/dL — ABNORMAL LOW (ref 12.0–15.0)
MCH: 29.3 pg (ref 26.0–34.0)
MCHC: 32.2 g/dL (ref 30.0–36.0)
MCV: 90.9 fL (ref 80.0–100.0)
Platelets: 249 10*3/uL (ref 150–400)
RBC: 3.31 MIL/uL — ABNORMAL LOW (ref 3.87–5.11)
RDW: 13.3 % (ref 11.5–15.5)
WBC: 14.5 10*3/uL — ABNORMAL HIGH (ref 4.0–10.5)
nRBC: 0 % (ref 0.0–0.2)

## 2019-11-25 MED ORDER — ZOLPIDEM TARTRATE 5 MG PO TABS: 5.0000 mg | ORAL_TABLET | Freq: Every evening | ORAL | Status: DC | PRN

## 2019-11-25 MED ORDER — SENNOSIDES-DOCUSATE SODIUM 8.6-50 MG PO TABS
2.0000 | ORAL_TABLET | ORAL | Status: DC
  Administered 2019-11-25 – 2019-11-26 (×2): 2 via ORAL
  Filled 2019-11-25 (×2): qty 2

## 2019-11-25 MED ORDER — COCONUT OIL OIL: 1.0000 "application " | TOPICAL_OIL | Status: DC | PRN

## 2019-11-25 MED ORDER — IBUPROFEN 600 MG PO TABS
600.0000 mg | ORAL_TABLET | Freq: Four times a day (QID) | ORAL | Status: DC | PRN
  Administered 2019-11-25 – 2019-11-26 (×6): 600 mg via ORAL
  Filled 2019-11-25 (×6): qty 1

## 2019-11-25 MED ORDER — SIMETHICONE 80 MG PO CHEW
80.0000 mg | CHEWABLE_TABLET | ORAL | Status: DC
  Administered 2019-11-25: 80 mg via ORAL
  Filled 2019-11-25: qty 1

## 2019-11-25 MED ORDER — OXYCODONE HCL 5 MG PO TABS: 5.0000 mg | ORAL_TABLET | ORAL | Status: DC | PRN

## 2019-11-25 MED ORDER — SIMETHICONE 80 MG PO CHEW: 80.0000 mg | CHEWABLE_TABLET | ORAL | Status: DC | PRN

## 2019-11-25 MED ORDER — HYDROMORPHONE HCL 1 MG/ML IJ SOLN: 0.2000 mg | INTRAMUSCULAR | Status: DC | PRN

## 2019-11-25 MED ORDER — ESCITALOPRAM OXALATE 20 MG PO TABS
20.0000 mg | ORAL_TABLET | Freq: Every day | ORAL | Status: DC
  Administered 2019-11-25 – 2019-11-26 (×2): 20 mg via ORAL
  Filled 2019-11-25 (×2): qty 1

## 2019-11-25 MED ORDER — MENTHOL 3 MG MT LOZG: 1.0000 | LOZENGE | OROMUCOSAL | Status: DC | PRN

## 2019-11-25 MED ORDER — TETANUS-DIPHTH-ACELL PERTUSSIS 5-2.5-18.5 LF-MCG/0.5 IM SUSP: 0.5000 mL | Freq: Once | INTRAMUSCULAR | Status: DC

## 2019-11-25 MED ORDER — DIBUCAINE (PERIANAL) 1 % EX OINT: 1.0000 "application " | TOPICAL_OINTMENT | CUTANEOUS | Status: DC | PRN

## 2019-11-25 MED ORDER — SIMETHICONE 80 MG PO CHEW
80.0000 mg | CHEWABLE_TABLET | Freq: Three times a day (TID) | ORAL | Status: DC
  Administered 2019-11-25 – 2019-11-26 (×3): 80 mg via ORAL
  Filled 2019-11-25 (×4): qty 1

## 2019-11-25 MED ORDER — OXYTOCIN-SODIUM CHLORIDE 30-0.9 UT/500ML-% IV SOLN: 2.5000 [IU]/h | INTRAVENOUS | Status: AC

## 2019-11-25 MED ORDER — PRENATAL MULTIVITAMIN CH
1.0000 | ORAL_TABLET | Freq: Every day | ORAL | Status: DC
  Administered 2019-11-25 – 2019-11-26 (×2): 1 via ORAL
  Filled 2019-11-25 (×2): qty 1

## 2019-11-25 MED ORDER — LACTATED RINGERS IV SOLN: INTRAVENOUS | Status: DC

## 2019-11-25 MED ORDER — KETOROLAC TROMETHAMINE 30 MG/ML IJ SOLN
INTRAMUSCULAR | Status: AC
  Filled 2019-11-25: qty 1

## 2019-11-25 MED ORDER — KETOROLAC TROMETHAMINE 30 MG/ML IJ SOLN
30.0000 mg | Freq: Once | INTRAMUSCULAR | Status: AC | PRN
  Administered 2019-11-25: 30 mg via INTRAVENOUS

## 2019-11-25 MED ORDER — ACETAMINOPHEN 325 MG PO TABS
650.0000 mg | ORAL_TABLET | ORAL | Status: DC | PRN
  Administered 2019-11-25 (×2): 650 mg via ORAL
  Filled 2019-11-25 (×2): qty 2

## 2019-11-25 MED ORDER — WITCH HAZEL-GLYCERIN EX PADS: 1.0000 "application " | MEDICATED_PAD | CUTANEOUS | Status: DC | PRN

## 2019-11-25 MED ORDER — DIPHENHYDRAMINE HCL 25 MG PO CAPS: 25.0000 mg | ORAL_CAPSULE | Freq: Four times a day (QID) | ORAL | Status: DC | PRN

## 2019-11-25 NOTE — Lactation Note (Signed)
This note was copied from a baby's chart. Lactation Consultation Note  Patient Name: Tammy Knight [id number] Date: 11/25/2019 Reason for consult: Initial assessment;1st time breastfeeding;Term P1, 4 hour term female infant. Mom has DEBP at home.  Per mom, infant latched well in room no BF concerns at this time. Mom knows to BF infant according to cues, on demand, 8 to 12+ times within 24 hours. Parents will do as much STS with infant as possible. Mom knows to call RN or LC if she has questions, concerns or inf she needs assistance with latching infant at breast. Reviewed Baby & Me book's Breastfeeding Basics.  Mom made aware of O/P services, breastfeeding support groups, community resources, and our phone # for post-discharge questions.   Maternal Data Formula Feeding for Exclusion: No Has patient been taught Hand Expression?: Yes Does the patient have breastfeeding experience prior to this delivery?: No  Feeding Feeding Type: Breast Fed  LATCH Score             Interventions Interventions: Breast feeding basics reviewed;Skin to skin;Breast massage;Hand express  Lactation Tools Discussed/Used WIC Program: No   Consult Status Consult Status: Follow-up Date: 11/25/19 Follow-up type: In-patient    Tammy Knight 11/25/2019, 2:59 AM

## 2019-11-25 NOTE — Progress Notes (Signed)
Epidural site clean and dry.

## 2019-11-25 NOTE — Lactation Note (Signed)
This note was copied from a baby's chart. Lactation Consultation Note  Patient Name: Tammy Knight [id number] Date: 11/25/2019   Baby Tammy Burgo now 59 hours old.  Lights very dim on arrival. Parents report they feel she is breastfeeding well.  Mom denies breast or nipple soreness.  Mom reports she knows how to hand express and has a DEBP at home.  Parents with questions.  Answered all questions and concerns.  Urged to feed on cue and 8-12 or more times day.  Delay pumping until breastfeeding well established.  Call lactation as needed.  Maternal Data    Feeding Feeding Type: Breast Fed  [facility] Score                   Interventions    Lactation Tools Discussed/Used     Consult Status      Tammy Knight 11/25/2019, 8:45 PM

## 2019-11-25 NOTE — Op Note (Signed)
Tammy Knight, COUNTESS MEDICAL RECORD [id number] ACCOUNT [id number] DATE OF BIRTH:14-May-1990 FACILITY: [facility] LOCATION: [place] II, [place]  OPERATIVE REPORT  DATE OF PROCEDURE:  11/24/2019  PREOPERATIVE DIAGNOSIS:  Arrest of dilation.  POSTOPERATIVE DIAGNOSIS:  Arrest of dilation.  PROCEDURE:  Primary low transverse cesarean section.  SURGEON:  Everlene Farrier, MD.  ANESTHESIA:  Epidural.  ESTIMATED BLOOD LOSS:  mL.  FINDINGS:  Viable female infant.  Apgars arterial cord pH birth weight pending.  INDICATIONS AND CONSENT:  This patient is a 29 year old G1 P0 at 25 and 0/7th weeks who was admitted for 2-stage induction of labor.  She progressed to 2 cm dilation, 90% effacement, -2 station.  IUPC is placed assuring good labor pattern.  After several  hours, there is no change.  Recommendation for cesarean section was made.  Potential risks and complications were discussed preoperatively including but not limited to infection, organ damage, bleeding requiring transfusion of blood products with HIV  and hepatitis acquisition, DVT, PE, pneumonia, wound breakdown.  The patient is status post abdominoplasty.  She states she understands and agrees and consent was signed on the chart.  DESCRIPTION OF PROCEDURE:  The patient was taken to the operating room where she was identified.  Her epidural is augmented to a surgical level.  She was placed in the dorsal supine position with a 15-degree left lateral wedge.  She was prepped vaginally  with Betadine.  Foley catheter was already in place and prepped with ChloraPrep.  After 3 minute drying time, timeout was undertaken and she was draped in a sterile fashion.  SHE IS ALLERGIC TO PENICILLIN and gentamicin and clindamycin have been ordered  and running.  After testing for adequate epidural anesthesia, skin was entered through a Pfannenstiel incision about two fingerbreadths above the level of the symphysis pubis.   Dissection was carried out in layers.  There are multiple subcutaneous  bleeders that are controlled with cautery.  Dissection was carried out down through the transverse incision of the fascia and the peritoneum was taken down superiorly and inferiorly.  The bladder flap was developed.  Uterus was incised in a low  transverse manner and the uterine cavity was entered bluntly with a hemostat.  The uterine incision is extended with the fingers.  Vertex was delivered and the baby was delivered without difficulty.  After 1 minute drying time cord was clamped and cut  and the baby was handed to waiting pediatrics team.  Placenta was manually delivered.  Uterine cavity was clean.  Uterus and the leads well.  Uterus was then closed in two running locking imbricating layers of 0 Monocryl suture.  This achieved good  hemostasis.  Palpation reveals the right ovary to be surgically absent.  The left ovary palpates normally.  Copious lavage was carried out and all returned as clear.  The anterior peritoneum was closed in a running fashion with 0 Monocryl suture, which  was also used to plicate the pyramidalis as well as the rectus muscles in the midline.  Good hemostasis on the rectus abdominis was obtained with a 2-0 chromic.  Lavage was again carried out.  The anterior fascia was then closed in a running fashion with  a 0 looped PDS, taking generous wide bites of the fascia.  Subcutaneous layer was dry.  It was reapproximated with interrupted 2-0 plain suture and the skin was closed in a subcuticular fashion with a 4-0 Vicryl on a Keith needle.  Benzoin and  Steri-Strips are applied followed  by a honeycomb dressing and a pressure dressing.  All counts were correct.  The patient was taken to recovery room in stable condition.  PN/NUANCE  D:11/24/2019 T:11/25/2019 JOB:[id number]

## 2019-11-25 NOTE — Anesthesia Postprocedure Evaluation (Signed)
Anesthesia Post Note  Patient: Tammy Knight  Procedure(s) Performed: CESAREAN SECTION (N/A )     Patient location during evaluation: Mother Baby Anesthesia Type: Epidural Level of consciousness: oriented and awake and alert Pain management: pain level controlled Vital Signs Assessment: post-procedure vital signs reviewed and stable Respiratory status: spontaneous breathing and respiratory function stable Cardiovascular status: blood pressure returned to baseline and stable Postop Assessment: no headache, no backache, no apparent nausea or vomiting and able to ambulate Anesthetic complications: no   No complications documented.  Last Vitals:  Vitals:   11/25/19 0000 11/25/19 0015  BP: 130/75 123/78  Pulse: 69 64  Resp: 20 18  Temp: (P) 36.8 C   SpO2: 100% 100%    Last Pain:  Vitals:   11/25/19 0015  TempSrc:   PainSc: (P) 4    Pain Goal:    LLE Motor Response: Purposeful movement (11/24/19 2345)   RLE Motor Response: Purposeful movement (11/24/19 2345)       Epidural/Spinal Function Cutaneous sensation: Able to Wiggle Toes (11/25/19 0000), Patient able to flex knees: Yes (11/25/19 0000), Patient able to lift hips off bed: Yes (11/25/19 0000), Back pain beyond tenderness at insertion site: No (11/25/19 0000), Progressively worsening motor and/or sensory loss: No (11/25/19 0000), Bowel and/or bladder incontinence post epidural: No (11/25/19 0000)  Barnet Glasgow

## 2019-11-25 NOTE — Progress Notes (Signed)
Patient attempted to void with no results, patient given sprite and juice spritzer and ambulated in the hallway.

## 2019-11-25 NOTE — Progress Notes (Signed)
Subjective: Postpartum Day 1: Cesarean Delivery Patient reports tolerating PO.    Objective: Vital signs in last 24 hours: Temp:  [98.2 F (36.8 C)-98.7 F (37.1 C)] 98.5 F (36.9 C) (08/07 0720) Pulse Rate:  [62-98] 71 (08/07 0720) Resp:  [16-24] 16 (08/07 0820) BP: (106-131)/(45-88) 117/68 (08/07 0738) SpO2:  [99 %-100 %] 100 % (08/07 0820)  Physical Exam:  General: alert, cooperative and no distress Lochia: appropriate Uterine Fundus: firm Incision: healing well DVT Evaluation: No evidence of DVT seen on physical exam.  Recent Labs    11/24/19 0100 11/25/19 0422  HGB 10.5* 9.7*  HCT 32.3* 30.1*    Assessment/Plan: Status post Cesarean section. Doing well postoperatively.  Continue current care.  Shon Millet II 11/25/2019, 8:45 AM

## 2019-11-26 MED ORDER — OXYCODONE HCL 5 MG PO TABS: 5.0000 mg | ORAL_TABLET | Freq: Four times a day (QID) | ORAL | 0 refills | Status: DC | PRN

## 2019-11-26 MED ORDER — ACETAMINOPHEN 325 MG PO TABS: 650.0000 mg | ORAL_TABLET | Freq: Four times a day (QID) | ORAL | 0 refills | Status: DC | PRN

## 2019-11-26 MED ORDER — IBUPROFEN 600 MG PO TABS: 600.0000 mg | ORAL_TABLET | Freq: Four times a day (QID) | ORAL | 0 refills | Status: DC | PRN

## 2019-11-26 NOTE — Lactation Note (Addendum)
This note was copied from a baby's chart. Lactation Consultation Note  Patient Name: Tammy Knight Date: 11/26/2019  Mom is a G1 P1.  Parents being discharged. Mom reports that she is not breastfeeding well today.  Mom reports fussy at the breast. Trying to fed on arrival.  Infant coming off and on the breast and will not latch.  Inquired about pacifier.  Parents report they gave her a pacifier last night after feeds because she was fussy. [place] asked if she could assist with feeding.  Mom agreed.  Assisted mom in trying to latch infant on right breast in cradle hold.  Infant fussing at the breast. LC noted heart shaped tip of tongue when infant crying. Coming off and on.  Tried to roll nipple and hand express.  Infant latched for a few minutes and came off.  Then used manual pump .  Tried to stretch and evert tissue with it.  Infant will not latch still comes off and on.  Assist with 24 mm nipple shield. Infant latched and breastfed with rhythmic sucking and few intermittent swallows. Came off and fell asleep content. Reviewed nipple shield use. Urged to hand express and pump past breastfeedings.  Infant with 6 percent weight loss and now struggling to maintain latch.  Mom reports positive breast changes during pregnancy. Reviewed how to apply nipple shield.  Urged her not to use the nipple shield unless baby can't latch after prepumping with manual pump or rolling out nipple and hand expression. Urged to hand express prior to applying nipple shield as well.   Mom has areolar bruising and compression stripes on both nipples. Tech came in for hearing test. Infant content with hearing test and initiated pumping with mom. Mom got a few drops of colostrum which we fed back to baby.  Urged to make sure to pump past 5-6 feeds during day and hand express.  Reviewed cluster feeding.  Urged parents to follow up with lactation as needed.    Maternal Data    Feeding Feeding Type: Breast Fed  LATCH  Score Latch: Repeated attempts needed to sustain latch, nipple held in mouth throughout feeding, stimulation needed to elicit sucking reflex.  Audible Swallowing: A few with stimulation  Type of Nipple: Everted at rest and after stimulation  Comfort (Breast/Nipple): Soft / non-tender  Hold (Positioning): Assistance needed to correctly position infant at breast and maintain latch.  LATCH Score: 7  Interventions    Lactation Tools Discussed/Used     Consult Status      Tammy Knight 11/26/2019, 3:01 PM

## 2019-11-26 NOTE — Progress Notes (Signed)
CSW received consult for hx of anxiety with panic attacks and SI with 2 BH visits.  CSW met with [name] at bedside to offer support and complete assessment.  On arrival, CSW introduced self and stated purpose for visit. [name], [name], and [name] were present at time of visit, however, after PPD/A and SIDS education, [name] and [name] stepped out of room to offer [name] privacy during assessment. [name], [name], and [name] were pleasant and engaged during visit.   CSW provided education regarding the baby blues period vs. perinatal mood disorders, discussed treatment and gave resources for mental health follow up if concerns arise.  CSW recommends self-evaluation during the postpartum time period using the New Mom Checklist from Postpartum Progress and encouraged [name], [name], and [name] to contact a medical professional if symptoms are noted at any time. All stated understanding and denied any additional questions.   CSW provided review of Sudden Infant Death Syndrome (SIDS) precautions. [name], [name], and [name] stated understanding and denied any additional questions.  [name] and [name] confirmed having all needed items for baby including car seat and crib and bassinet for baby's safe sleep.   During assessment, [name] reported hx of depression, anxiety, and SI. [name] related SI hx to discontinuing psychotropic meds abruptly. [name] stated I realized I need my medication and I do much better on them. [name] confirmed active Rx for Lexapro. In addition to RX, [name] identified warm baths and walking as coping skills. [name] stated infant provides purpose and meaning to her life. [name] identified current mood as "feeling good" and "happy". [name] denied any SI, HI, or domestic violence. [name] identified [name], mom, and [name]'s mom as support, however,  stated she is interested in finding a therapist for additional support. CSW provided [name] will list of counseling options in [facility]. [name] declined any additional support or resources.    CSW identifies no further need  for intervention and no barriers to discharge at this time.  Tammy Knight D. Lissa Morales, MSW, [place] Clinical Social Worker [phone]

## 2019-11-26 NOTE — Discharge Summary (Signed)
Postpartum Discharge Summary  Date of Service updated 11/26/19     Patient Name: Tammy Knight DOB: 02/18/1991 MRN: [id number]  Date of admission: 11/24/2019 Delivery date:11/24/2019  Delivering provider: Everlene Farrier  Date of discharge: 11/26/2019  Admitting diagnosis: Term pregnancy [Z34.90] Intrauterine pregnancy: [redacted]w[redacted]d    Secondary diagnosis:  Active Problems:   Term pregnancy  Additional problems:     Discharge diagnosis: Term Pregnancy Delivered                                              Post partum procedures Augmentation: AROM and Pitocin Complications: None  Hospital course: Induction of Labor With Cesarean Section   29y.o. yo G1P1001 at 380w0das admitted to the hospital 11/24/2019 for induction of labor. Patient had a labor course significant for arrest of dilation. The patient went for cesarean section due to Arrest of Dilation. Delivery details are as follows: Membrane Rupture Time/Date: 4:33 PM ,11/24/2019   Delivery Method:C-Section, Low Transverse  Details of operation can be found in separate operative Note.  Patient had an uncomplicated postpartum course. She is ambulating, tolerating a regular diet, passing flatus, and urinating well.  Patient is discharged home in stable condition on 11/26/19.      Newborn Data: Birth date:11/24/2019  Birth time:10:25 PM  Gender:Female  Living status:Living  Apgars:7 ,8  [id number]                                 Magnesium Sulfate received: No BMZ received: No Rhophylac:No MMR:No T-DaP:Given prenatally Flu: No Transfusion:No  Physical exam  Vitals:   11/25/19 1924 11/25/19 2222 11/25/19 2341 11/26/19 0602  BP: (!) 105/56 116/63  (!) 117/57  Pulse: 73 69  65  Resp: 16 15 16 17   Temp: 97.9 F (36.6 C) 98.6 F (37 C)  98.3 F (36.8 C)  TempSrc: Axillary Oral  Oral  SpO2: 100% 97% 98% 100%  Weight:      Height:       General: alert, cooperative and no distress Lochia: appropriate Uterine  Fundus: firm Incision: Healing well with no significant drainage DVT Evaluation: No evidence of DVT seen on physical exam. Labs: Lab Results  Component Value Date   WBC 14.5 (H) 11/25/2019   HGB 9.7 (L) 11/25/2019   HCT 30.1 (L) 11/25/2019   MCV 90.9 11/25/2019   PLT 249 11/25/2019   CMP Latest Ref Rng & Units 10/13/2019  Glucose 70 - 99 mg/dL 84  BUN 6 - 20 mg/dL 7  Creatinine 0.44 - 1.00 mg/dL 0.35(L)  Sodium 135 - 145 mmol/L 135  Potassium 3.5 - 5.1 mmol/L 3.6  Chloride 98 - 111 mmol/L 106  CO2 22 - 32 mmol/L 21(L)  Calcium 8.9 - 10.3 mg/dL 8.5(L)  Total Protein 6.5 - 8.1 g/dL -  Total Bilirubin 0.3 - 1.2 mg/dL -  Alkaline Phos 38 - 126 U/L -  AST 15 - 41 U/L -  ALT 0 - 44 U/L -   Edinburgh Score: Edinburgh Postnatal Depression Scale Screening Tool 11/25/2019  I have been able to laugh and see the funny side of things. (No Data)      After visit meds:  Allergies as of 11/26/2019      Reactions   Penicillins Anaphylaxis  Bee Venom Swelling      Medication List    TAKE these medications   acetaminophen 325 MG tablet Commonly known as: TYLENOL Take 2 tablets (650 mg total) by mouth every 6 (six) hours as needed for mild pain (temperature > 101.5.).   escitalopram 20 MG tablet Commonly known as: Lexapro Take 1 tablet (20 mg total) by mouth daily.   ibuprofen 600 MG tablet Commonly known as: ADVIL Take 1 tablet (600 mg total) by mouth every 6 (six) hours as needed for moderate pain.   oxyCODONE 5 MG immediate release tablet Commonly known as: Oxy IR/ROXICODONE Take 1 tablet (5 mg total) by mouth every 6 (six) hours as needed for moderate pain.   Prenatal 19 29-1 MG Chew Chew 2 each by mouth daily.        Discharge home in stable condition Infant Feeding: Breast Infant Disposition:home with mother Discharge instruction: per After Visit Summary and Postpartum booklet. Activity: Advance as tolerated. Pelvic rest for 6 weeks.  Diet: routine  diet Anticipated Birth Control: Unsure Postpartum Appointment:6 weeks Additional Postpartum F/U:  Future Appointments: Future Appointments  Date Time Provider [place]  12/12/2019  2:20 PM Vivi Barrack, MD [facility]   Follow up Visit:      11/26/2019 Allena Katz, MD

## 2019-12-12 ENCOUNTER — Ambulatory Visit (INDEPENDENT_AMBULATORY_CARE_PROVIDER_SITE_OTHER): Payer: BC Managed Care – PPO | Admitting: Family Medicine

## 2019-12-12 ENCOUNTER — Encounter: Payer: Self-pay | Admitting: Family Medicine

## 2019-12-12 ENCOUNTER — Other Ambulatory Visit: Payer: Self-pay

## 2019-12-12 VITALS — BP 107/69 | HR 73 | Temp 98.2°F | Ht 65.0 in | Wt 233.2 lb

## 2019-12-12 DIAGNOSIS — F324 Major depressive disorder, single episode, in partial remission: Secondary | ICD-10-CM | POA: Diagnosis not present

## 2019-12-12 DIAGNOSIS — Z0001 Encounter for general adult medical examination with abnormal findings: Secondary | ICD-10-CM | POA: Diagnosis not present

## 2019-12-12 MED ORDER — ESCITALOPRAM OXALATE 20 MG PO TABS: 20.0000 mg | ORAL_TABLET | Freq: Every day | ORAL | 3 refills | Status: DC

## 2019-12-12 NOTE — Assessment & Plan Note (Signed)
Overall stable.  No signs of postpartum depression.  Continue Lexapro 20 mg daily.  Follow-up in 1 year.

## 2019-12-12 NOTE — Progress Notes (Signed)
Chief Complaint:  Tammy Knight is a 29 y.o. female who presents today for her annual comprehensive physical exam.    Assessment/Plan:  Varicose veins Reassured patient.  Should hopefully have some improvement now that she is 2 weeks after delivery.  She will let us know if she needs referral to vascular surgery.  Chronic Problems Addressed Today: Depression, major, single episode, in partial remission ([place]) Overall stable.  No signs of postpartum depression.  Continue Lexapro 20 mg daily.  Follow-up in 1 year.  Preventative Healthcare: UTD.   Patient Counseling(The following topics were reviewed and/or handout was given):  -Nutrition: Stressed importance of moderation in sodium/caffeine intake, saturated fat and cholesterol, caloric balance, sufficient intake of fresh fruits, vegetables, and fiber.  -Stressed the importance of regular exercise.   -Substance Abuse: Discussed cessation/primary prevention of tobacco, alcohol, or other drug use; driving or other dangerous activities under the influence; availability of treatment for abuse.   -Injury prevention: Discussed safety belts, safety helmets, smoke detector, smoking near bedding or upholstery.   -Sexuality: Discussed sexually transmitted diseases, partner selection, use of condoms, avoidance of unintended pregnancy and contraceptive alternatives.   -Dental health: Discussed importance of regular tooth brushing, flossing, and dental visits.  -Health maintenance and immunizations reviewed. Please refer to Health maintenance section.  Return to care in 1 year for next preventative visit.     Subjective:  HPI:  She has no acute complaints today.  Gave birth via C-section 2 weeks ago.  Overall doing well.  Having some varicose veins.  Lifestyle Diet: Balanced.  Exercise: Limited due to recent pregnancy.   Depression screen PHQ 2/9 08/13/2017  Decreased Interest 1  Down, Depressed, Hopeless 1  PHQ - 2 Score 2   Altered sleeping 1  Tired, decreased energy 1  Change in appetite 1  Feeling bad or failure about yourself  1  Trouble concentrating 1  Moving slowly or fidgety/restless 0  Suicidal thoughts 0  PHQ-9 Score 7  Difficult doing work/chores Very difficult    Health Maintenance Due  Topic Date Due  . Hepatitis C Screening  Never done  . COVID-19 Vaccine (1) Never done     ROS: Per HPI, otherwise a complete review of systems was negative.   PMH:  The following were reviewed and entered/updated in epic: Past Medical History:  Diagnosis Date  . Asthma    h/o no inhalers  . Chlamydia   . Depression   . Hay fever   . UTI (urinary tract infection)    Patient Active Problem List   Diagnosis Date Noted  . Depression, major, single episode, in partial remission ([place]) 06/09/2018  . PMS (premenstrual syndrome) 12/13/2017  . Anxiety disorder 08/13/2017   Past Surgical History:  Procedure Laterality Date  . ABDOMINOPLASTY  2014  . CESAREAN SECTION N/A 11/24/2019   Procedure: CESAREAN SECTION;  Surgeon: Everlene Farrier, MD;  Location: [facility];  Service: Obstetrics;  Laterality: N/A;  . LAPAROSCOPIC SALPINGO OOPHERECTOMY Right 06/13/2018   Procedure: LAPAROSCOPIC RIGHT  OOPHORECTOMY;  Surgeon: Harlin Heys, MD;  Location: [facility] ORS;  Service: Gynecology;  Laterality: Right;  . TONSILLECTOMY      Family History  Problem Relation Age of Onset  . Heart attack Father        2014  . Alcohol abuse Father   . Depression Father   . Hyperlipidemia Father   . Hypertension Father   . COPD Mother   . Glaucoma Mother   . Arthritis  Mother   . Depression Mother   . Depression Sister   . Hearing loss Sister   . Birth defects Sister        no thyroid  . Diabetes Paternal Grandfather   . Breast cancer Maternal Aunt   . Ovarian cancer Neg Hx   . Colon cancer Neg Hx     Medications- reviewed and updated Current Outpatient Medications  Medication Sig Dispense Refill  .  escitalopram (LEXAPRO) 20 MG tablet Take 1 tablet (20 mg total) by mouth daily. 90 tablet 3  . ibuprofen (ADVIL) 600 MG tablet Take 1 tablet (600 mg total) by mouth every 6 (six) hours as needed for moderate pain. 60 tablet 0  . oxyCODONE (OXY IR/ROXICODONE) 5 MG immediate release tablet Take 1 tablet (5 mg total) by mouth every 6 (six) hours as needed for moderate pain. 20 tablet 0  . Prenatal Vit-Fe Fumarate-FA (PRENATAL 19) 29-1 MG CHEW Chew 2 each by mouth daily.    Marland Kitchen acetaminophen (TYLENOL) 325 MG tablet Take 2 tablets (650 mg total) by mouth every 6 (six) hours as needed for mild pain (temperature > 101.5.). (Patient not taking: Reported on 12/12/2019) 60 tablet 0   No current facility-administered medications for this visit.    Allergies-reviewed and updated Allergies  Allergen Reactions  . Penicillins Anaphylaxis  . Bee Venom Swelling    Social History   Socioeconomic History  . Marital status: Single    Spouse name: Not on file  . Number of children: Not on file  . Years of education: Not on file  . Highest education level: Not on file  Occupational History  . Not on file  Tobacco Use  . Smoking status: Former Smoker    Years: 5.00    Types: Cigarettes    Quit date: 01/07/2018    Years since quitting: 1.9  . Smokeless tobacco: Never Used  . Tobacco comment: 1-2 cig per day  Vaping Use  . Vaping Use: Never used  Substance and Sexual Activity  . Alcohol use: Not Currently    Alcohol/week: 5.0 standard drinks    Types: 5 Glasses of wine per week  . Drug use: No  . Sexual activity: Yes    Partners: Male    Birth control/protection: None  Other Topics Concern  . Not on file  Social History Narrative  . Not on file   Social Determinants of Health   Financial Resource Strain:   . Difficulty of Paying Living Expenses: Not on file  Food Insecurity:   . Worried About Charity fundraiser in the Last Year: Not on file  . Ran Out of Food in the Last Year: Not on file   Transportation Needs:   . Lack of Transportation (Medical): Not on file  . Lack of Transportation (Non-Medical): Not on file  Physical Activity:   . Days of Exercise per Week: Not on file  . Minutes of Exercise per Session: Not on file  Stress:   . Feeling of Stress : Not on file  Social Connections:   . Frequency of Communication with Friends and Family: Not on file  . Frequency of Social Gatherings with Friends and Family: Not on file  . Attends Religious Services: Not on file  . Active Member of Clubs or Organizations: Not on file  . Attends Archivist Meetings: Not on file  . Marital Status: Not on file        Objective:  Physical Exam: BP 107/69  Pulse 73   Temp 98.2 F (36.8 C) (Temporal)   Ht 5\' 5"  (1.651 m)   Wt 233 lb 3.2 oz (105.8 kg)   LMP 02/24/2019 (Exact Date)   SpO2 98%   BMI 38.81 kg/m   Body mass index is 38.81 kg/m. Wt Readings from Last 3 Encounters:  12/12/19 233 lb 3.2 oz (105.8 kg)  11/24/19 258 lb 8 oz (117.3 kg)  10/13/19 254 lb (115.2 kg)   Gen: NAD, resting comfortably HEENT: TMs normal bilaterally. OP clear. No thyromegaly noted.  CV: RRR with no murmurs appreciated Pulm: NWOB, CTAB with no crackles, wheezes, or rhonchi GI: Normal bowel sounds present. Soft, Nontender, Nondistended. MSK: no edema, cyanosis, or clubbing noted.  Varicosities noted on bilateral lower extremities Skin: warm, dry Neuro: CN2-12 grossly intact. Strength 5/5 in upper and lower extremities. Reflexes symmetric and intact bilaterally.  Psych: Normal affect and thought content     Shaquaya Wuellner M. [name], MD 12/12/2019 2:55 PM

## 2019-12-12 NOTE — Patient Instructions (Signed)
It was very nice to see you today!  Congratulations on the little one!  I sent in a years worth of lexapro.  No blood work today.  I will see you back in a year.  Come back to see me sooner if needed.   Take care, Dr Jimmey Ralph  Please try these tips to maintain a healthy lifestyle:   Eat at least 3 REAL meals and 1-2 snacks per day.  Aim for no more than 5 hours between eating.  If you eat breakfast, please do so within one hour of getting up.    Each meal should contain half fruits/vegetables, one quarter protein, and one quarter carbs (no bigger than a computer mouse)   Cut down on sweet beverages. This includes juice, soda, and sweet tea.     Drink at least 1 glass of water with each meal and aim for at least 8 glasses per day   Exercise at least 150 minutes every week.    Preventive Care 28-28 Years Oldears Old, Female Preventive care refers to visits with your health care provider and lifestyle choices that can promote health and wellness. This includes:  A yearly physical exam. This may also be called an annual well check.  Regular dental visits and eye exams.  Immunizations.  Screening for certain conditions.  Healthy lifestyle choices, such as eating a healthy diet, getting regular exercise, not using drugs or products that contain nicotine and tobacco, and limiting alcohol use. What can I expect for my preventive care visit? Physical exam Your health care provider will check your:  Height and weight. This may be used to calculate body mass index (BMI), which tells if you are at a healthy weight.  Heart rate and blood pressure.  Skin for abnormal spots. Counseling Your health care provider may ask you questions about your:  Alcohol, tobacco, and drug use.  Emotional well-being.  Home and relationship well-being.  Sexual activity.  Eating habits.  Work and work Astronomer.  Method of birth control.  Menstrual cycle.  Pregnancy history. What  immunizations do I need?  Influenza (flu) vaccine  This is recommended every year. Tetanus, diphtheria, and pertussis (Tdap) vaccine  You may need a Td booster every 10 years. Varicella (chickenpox) vaccine  You may need this if you have not been vaccinated. Human papillomavirus (HPV) vaccine  If recommended by your health care provider, you may need three doses over 6 months. Measles, mumps, and rubella (MMR) vaccine  You may need at least one dose of MMR. You may also need a second dose. Meningococcal conjugate (MenACWY) vaccine  One dose is recommended if you are age 28-28 years and a first-year college student living in a residence hall, or if you have one of several medical conditions. You may also need additional booster doses. Pneumococcal conjugate (PCV13) vaccine  You may need this if you have certain conditions and were not previously vaccinated. Pneumococcal polysaccharide (PPSV23) vaccine  You may need one or two doses if you smoke cigarettes or if you have certain conditions. Hepatitis A vaccine  You may need this if you have certain conditions or if you travel or work in places where you may be exposed to hepatitis A. Hepatitis B vaccine  You may need this if you have certain conditions or if you travel or work in places where you may be exposed to hepatitis B. Haemophilus influenzae type b (Hib) vaccine  You may need this if you have certain conditions. You may receive vaccines  as individual doses or as more than one vaccine together in one shot (combination vaccines). Talk with your health care provider about the risks and benefits of combination vaccines. What tests do I need?  Blood tests  Lipid and cholesterol levels. These may be checked every 5 years starting at age 28.  Hepatitis C test.  Hepatitis B test. Screening  Diabetes screening. This is done by checking your blood sugar (glucose) after you have not eaten for a while (fasting).  Sexually  transmitted disease (STD) testing.  BRCA-related cancer screening. This may be done if you have a family history of breast, ovarian, tubal, or peritoneal cancers.  Pelvic exam and Pap test. This may be done every 3 years starting at age 28. Starting at age 55, this may be done every 5 years if you have a Pap test in combination with an HPV test. Talk with your health care provider about your test results, treatment options, and if necessary, the need for more tests. Follow these instructions at home: Eating and drinking   Eat a diet that includes fresh fruits and vegetables, whole grains, lean protein, and low-fat dairy.  Take vitamin and mineral supplements as recommended by your health care provider.  Do not drink alcohol if: ? Your health care provider tells you not to drink. ? You are pregnant, may be pregnant, or are planning to become pregnant.  If you drink alcohol: ? Limit how much you have to 0-1 drink a day. ? Be aware of how much alcohol is in your drink. In the [place] one drink equals one 12 oz bottle of beer (355 mL), one 5 oz glass of wine (148 mL), or one 1 oz glass of hard liquor (44 mL). Lifestyle  Take daily care of your teeth and gums.  Stay active. Exercise for at least 30 minutes on 5 or more days each week.  Do not use any products that contain nicotine or tobacco, such as cigarettes, e-cigarettes, and chewing tobacco. If you need help quitting, ask your health care provider.  If you are sexually active, practice safe sex. Use a condom or other form of birth control (contraception) in order to prevent pregnancy and STIs (sexually transmitted infections). If you plan to become pregnant, see your health care provider for a preconception visit. What's next?  Visit your health care provider once a year for a well check visit.  Ask your health care provider how often you should have your eyes and teeth checked.  Stay up to date on all vaccines. This information  is not intended to replace advice given to you by your health care provider. Make sure you discuss any questions you have with your health care provider. Document Revised: 12/16/2017 Document Reviewed: 12/16/2017 Elsevier Patient Education  2020 [employer].

## 2020-01-09 DIAGNOSIS — Z1389 Encounter for screening for other disorder: Secondary | ICD-10-CM | POA: Diagnosis not present

## 2020-01-26 IMAGING — DX DG WRIST COMPLETE 3+V*R*
4 series · 4 of 4 positions shown · non-contrast
Comparison: None.

CLINICAL DATA: Right wrist pain

EXAM:
RIGHT WRIST - COMPLETE 3+ VIEW

[wrist pa]
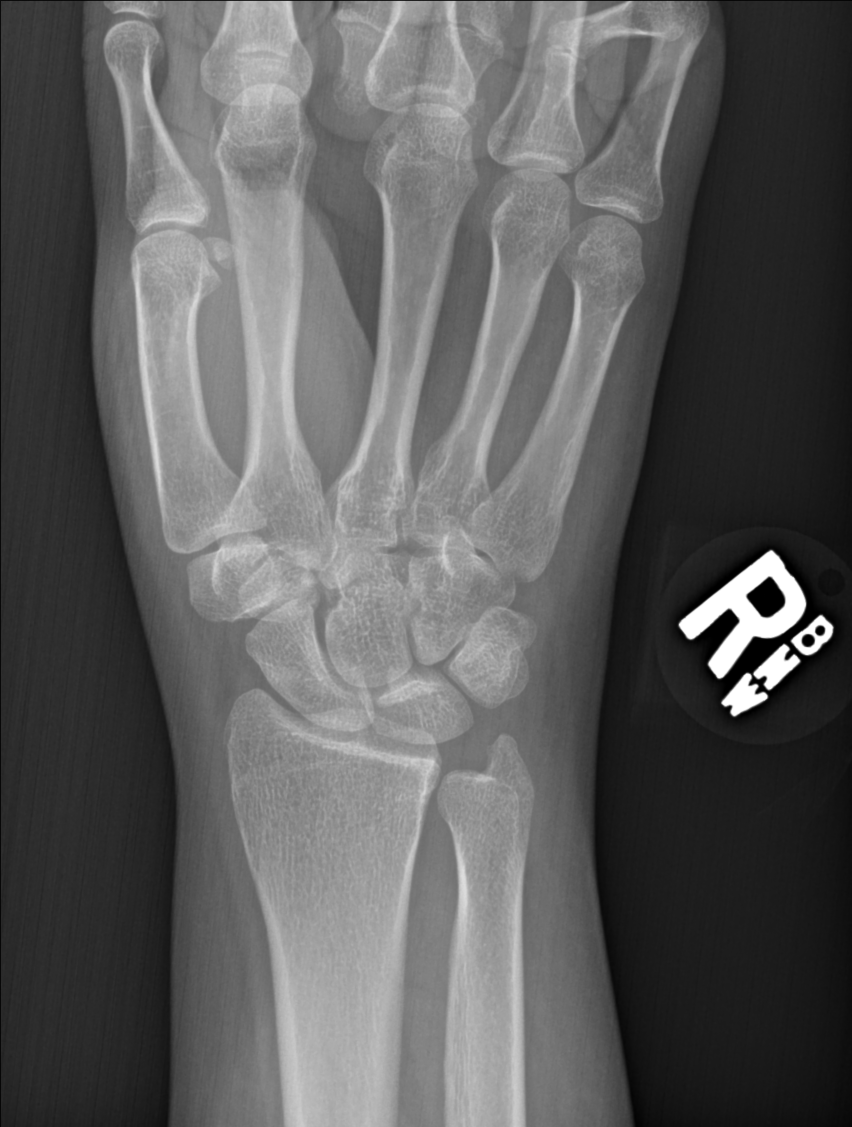

[wrist obl (oblique)]
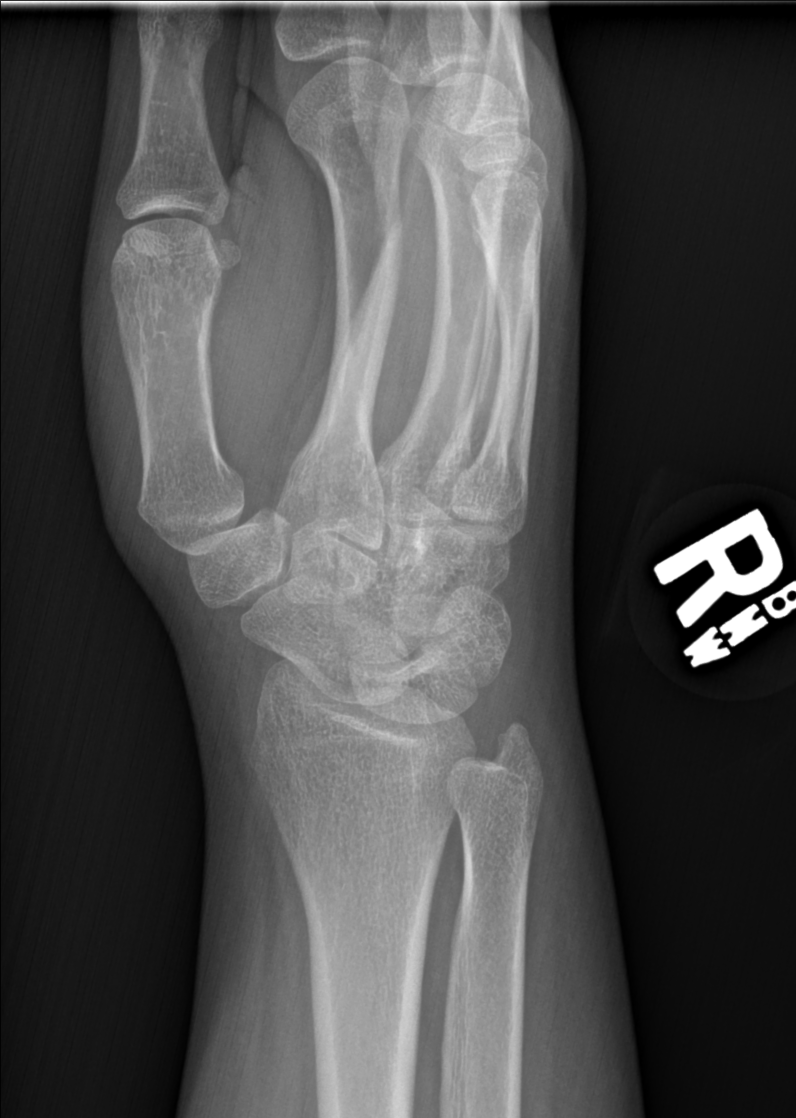

[wrist lat]
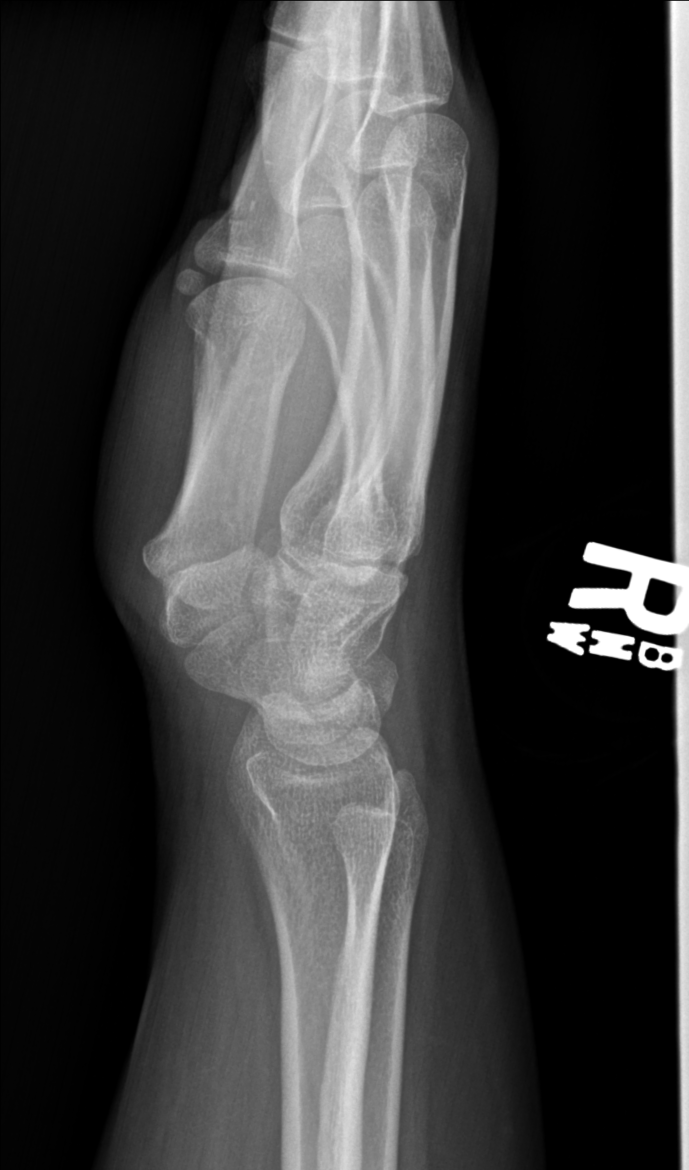

[scaphoid pa]
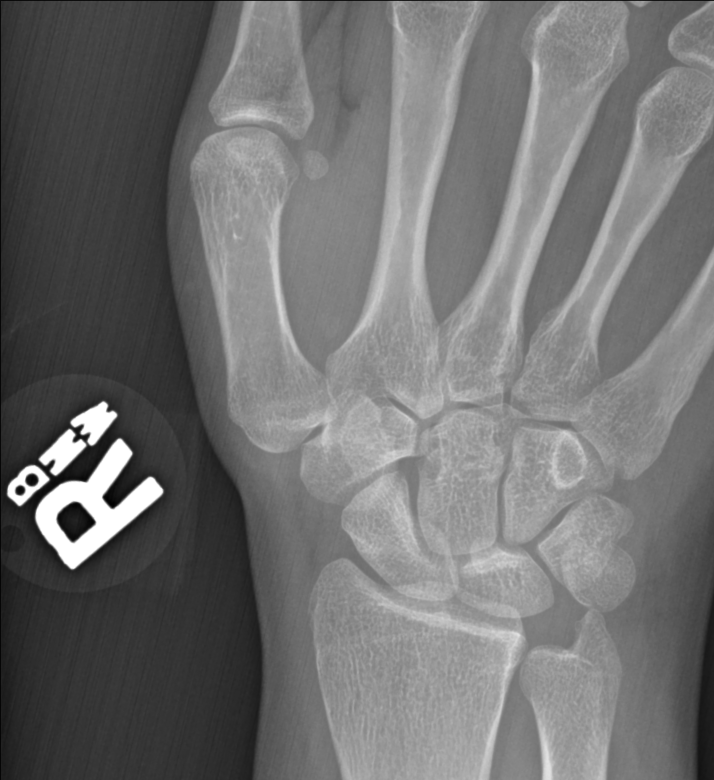

[4 of 4 positions shown; findings below may reference images not displayed]

FINDINGS: There is no evidence of fracture or dislocation. There is no
evidence of arthropathy or other focal bone abnormality. Soft
tissues are unremarkable.
IMPRESSION: No acute osseous injury of the right wrist.

## 2020-02-07 DIAGNOSIS — M9903 Segmental and somatic dysfunction of lumbar region: Secondary | ICD-10-CM | POA: Diagnosis not present

## 2020-02-07 DIAGNOSIS — M9901 Segmental and somatic dysfunction of cervical region: Secondary | ICD-10-CM | POA: Diagnosis not present

## 2020-02-07 DIAGNOSIS — M9905 Segmental and somatic dysfunction of pelvic region: Secondary | ICD-10-CM | POA: Diagnosis not present

## 2020-02-07 DIAGNOSIS — M9902 Segmental and somatic dysfunction of thoracic region: Secondary | ICD-10-CM | POA: Diagnosis not present

## 2020-02-12 DIAGNOSIS — M9902 Segmental and somatic dysfunction of thoracic region: Secondary | ICD-10-CM | POA: Diagnosis not present

## 2020-02-12 DIAGNOSIS — M9901 Segmental and somatic dysfunction of cervical region: Secondary | ICD-10-CM | POA: Diagnosis not present

## 2020-02-12 DIAGNOSIS — M9905 Segmental and somatic dysfunction of pelvic region: Secondary | ICD-10-CM | POA: Diagnosis not present

## 2020-02-12 DIAGNOSIS — M9903 Segmental and somatic dysfunction of lumbar region: Secondary | ICD-10-CM | POA: Diagnosis not present

## 2020-02-21 DIAGNOSIS — M9902 Segmental and somatic dysfunction of thoracic region: Secondary | ICD-10-CM | POA: Diagnosis not present

## 2020-02-21 DIAGNOSIS — M9905 Segmental and somatic dysfunction of pelvic region: Secondary | ICD-10-CM | POA: Diagnosis not present

## 2020-02-21 DIAGNOSIS — M9901 Segmental and somatic dysfunction of cervical region: Secondary | ICD-10-CM | POA: Diagnosis not present

## 2020-02-21 DIAGNOSIS — M9903 Segmental and somatic dysfunction of lumbar region: Secondary | ICD-10-CM | POA: Diagnosis not present

## 2020-05-06 IMAGING — CT CT ABD-PELV W/ CM
2 of 4 series · 16 of 46 positions shown, 18 images · IV contrast (Isovue)
Comparison: None.

CLINICAL DATA: Abdominal pain since last night. Constipation.

EXAM:
CT ABDOMEN AND PELVIS WITH CONTRAST
TECHNIQUE: Multidetector CT imaging of the abdomen and pelvis was performed
using the standard protocol following bolus administration of
intravenous contrast.
CONTRAST:  100mL 6PVEYX-ICC IOPAMIDOL (6PVEYX-ICC) INJECTION 61%

[Series 2: axial st · axial · 0.76mm/px · z∈[+820,+1280]mm · 13 of 102 slices shown, 15 images]
[im 5/102  soft-tissue]
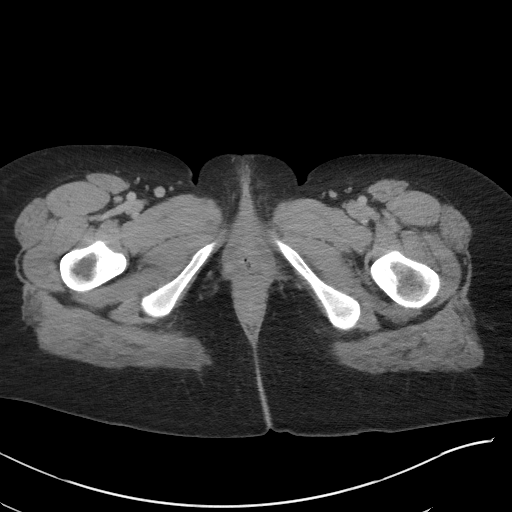
[im 5/102  bone]
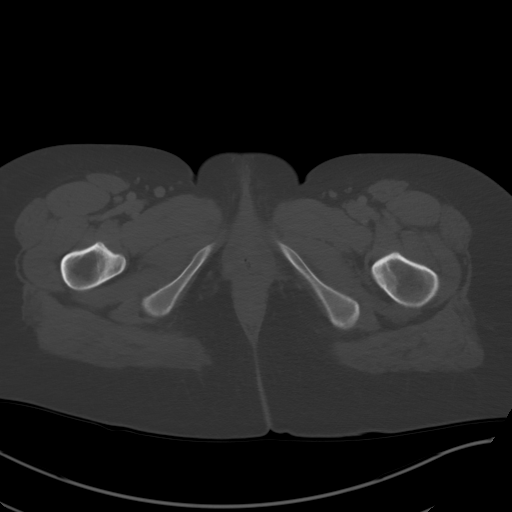
[im 14/102  soft-tissue]
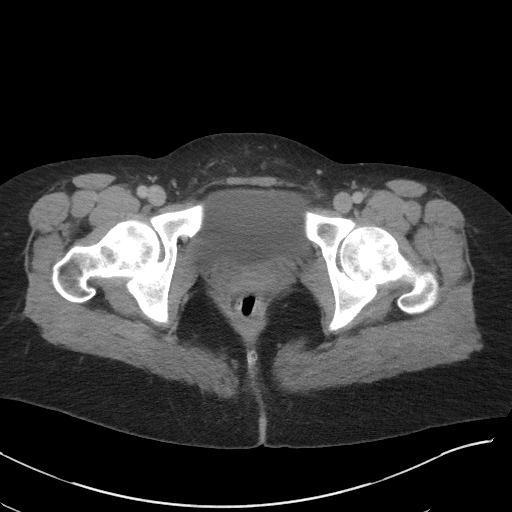
[im 22/102  soft-tissue]
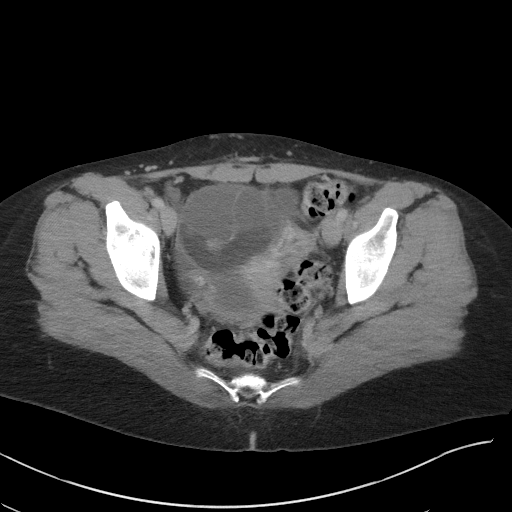
[im 27/102  soft-tissue]
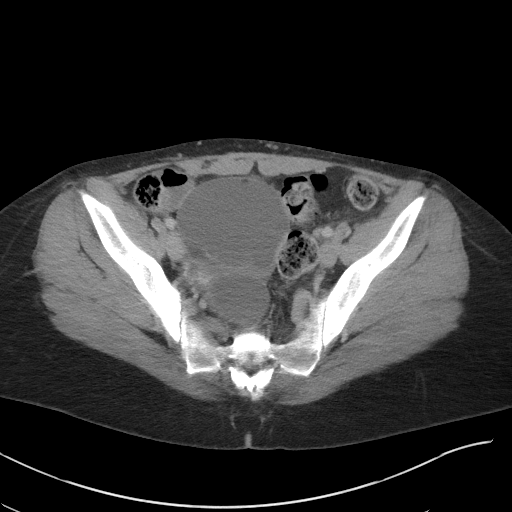
[im 36/102  soft-tissue]
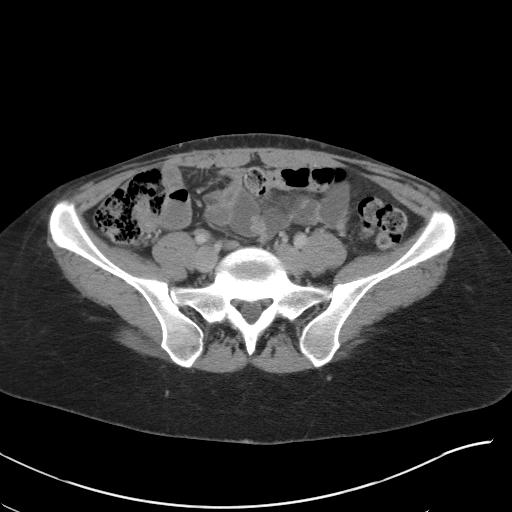
[im 44/102  soft-tissue]
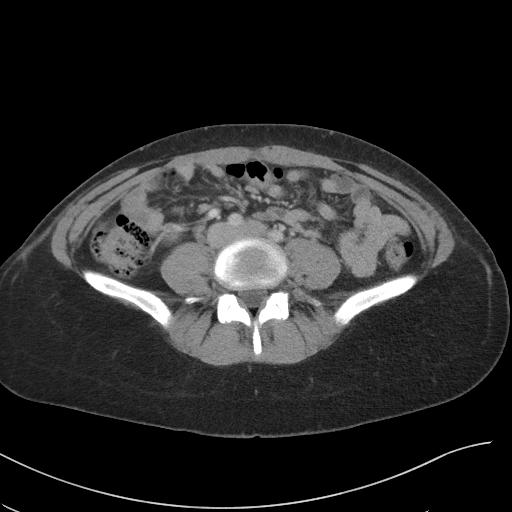
[im 53/102  soft-tissue]
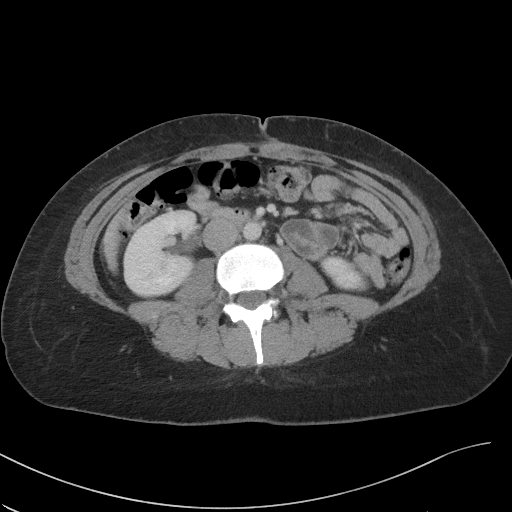
[im 58/102  soft-tissue]
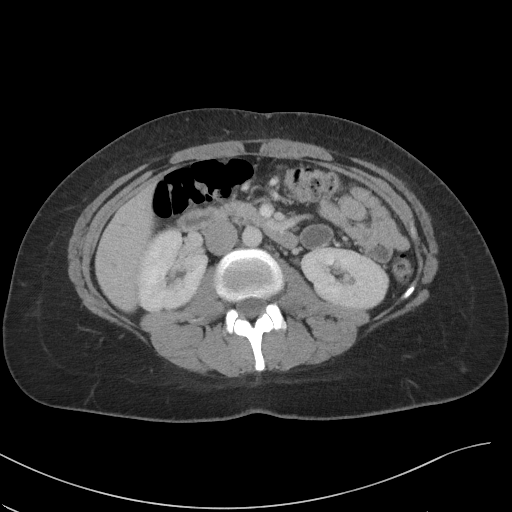
[im 66/102  soft-tissue]
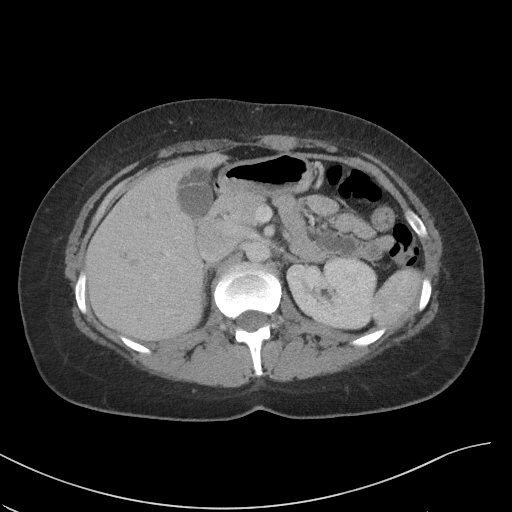
[im 66/102  bone]
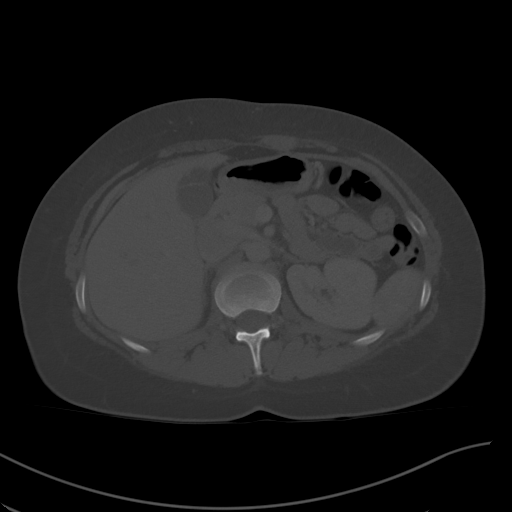
[im 75/102  soft-tissue]
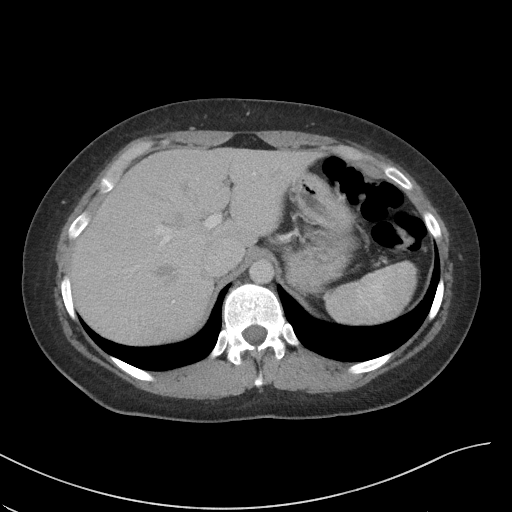
[im 80/102  soft-tissue]
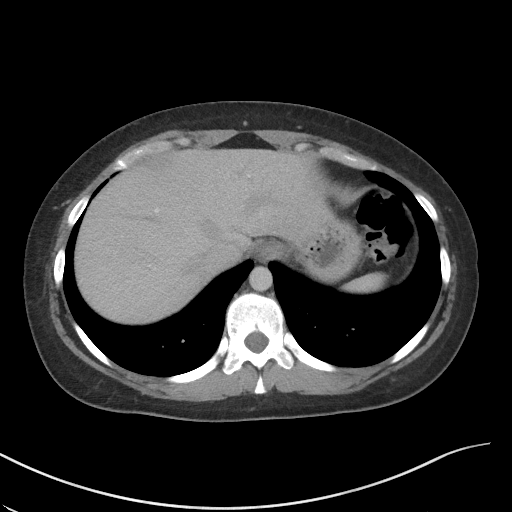
[im 88/102  soft-tissue]
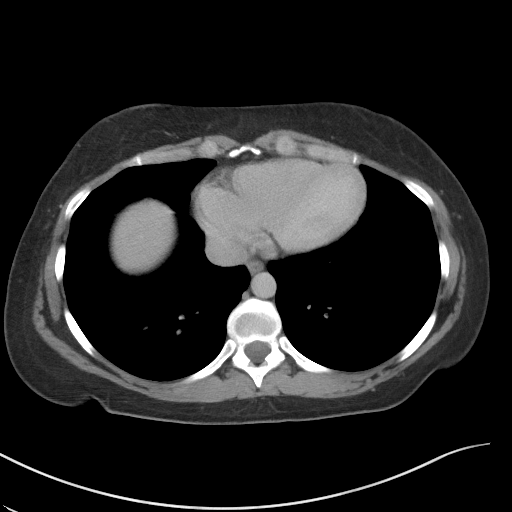
[im 97/102  soft-tissue]
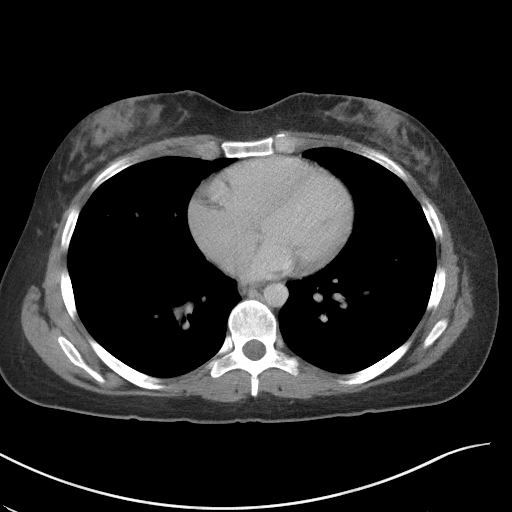

[Series 5: coronal st · coronal · 0.88mm/px · 3 of 99 slices shown]
[im 33/99  soft-tissue]
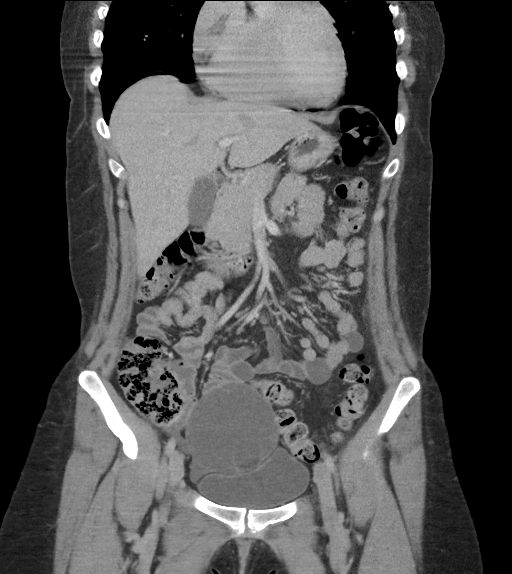
[im 44/99  soft-tissue]
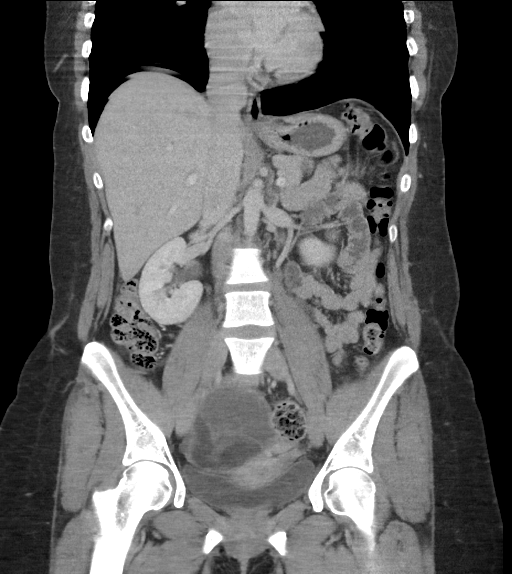
[im 55/99  soft-tissue]
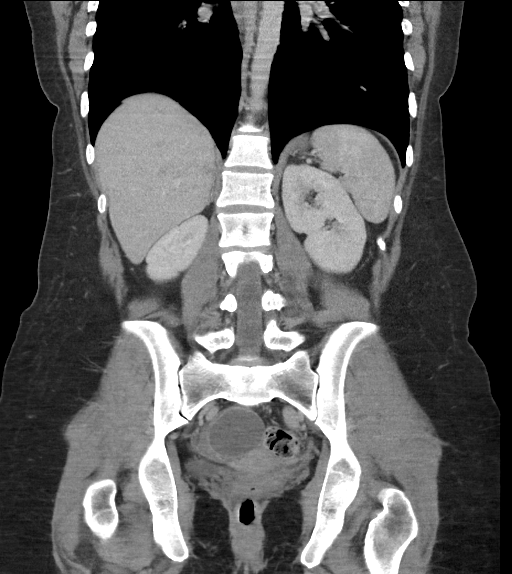

[16 of 46 positions shown; findings below may reference images not displayed]

FINDINGS: Lower chest: No acute abnormality.

Hepatobiliary: No focal liver abnormality is seen. No gallstones,
gallbladder wall thickening, or biliary dilatation.

Pancreas: Unremarkable. No pancreatic ductal dilatation or
surrounding inflammatory changes.

Spleen: Normal in size without focal abnormality.

Adrenals/Urinary Tract: Adrenal glands appear normal. Kidneys appear
normal without mass, stone or hydronephrosis. No perinephric fluid.
No ureteral or bladder calculi identified. Bladder appears normal.

Stomach/Bowel: No dilated large or small bowel loops. Moderate
amount of stool throughout the nondistended colon. No evidence of
bowel wall inflammation. Appendix is normal.

Vascular/Lymphatic: No significant vascular findings are present. No
enlarged abdominal or pelvic lymph nodes.

Reproductive: Mixed density mass within the RIGHT adnexal region,
with associated calcification, most likely ovarian dermoid,
measuring 11.5 x 8.3 x 8.5 cm

LEFT adnexal region is unremarkable.

Other: Trace free fluid in the lower RIGHT pericolic gutter, versus
extension component of the presumed dermoid.

Musculoskeletal: No acute or suspicious osseous finding.
IMPRESSION: 1. Mixed density mass within the RIGHT adnexal region, with
associated calcification, measuring 11.5 x 8.3 x 8.5 cm, most likely
ovarian dermoid, other neoplastic process considered much less
likely. Trace free fluid in the lower RIGHT paracolic gutter, versus
extension component of the presumed dermoid. Recommend GYN consult
for further workup considerations and/or possible surgical removal.
If any localizable RIGHT lower quadrant pain, would consider pelvic
ultrasound to exclude associated ovarian torsion.
2. Remainder of the abdomen and pelvis CT is unremarkable, as
detailed above. No bowel obstruction or evidence of bowel wall
inflammation seen. Moderate amount of stool throughout the
nondistended colon. Appendix is normal.

## 2020-07-01 DIAGNOSIS — M9905 Segmental and somatic dysfunction of pelvic region: Secondary | ICD-10-CM | POA: Diagnosis not present

## 2020-07-01 DIAGNOSIS — M9901 Segmental and somatic dysfunction of cervical region: Secondary | ICD-10-CM | POA: Diagnosis not present

## 2020-07-01 DIAGNOSIS — M9903 Segmental and somatic dysfunction of lumbar region: Secondary | ICD-10-CM | POA: Diagnosis not present

## 2020-07-01 DIAGNOSIS — M9902 Segmental and somatic dysfunction of thoracic region: Secondary | ICD-10-CM | POA: Diagnosis not present

## 2020-07-05 ENCOUNTER — Ambulatory Visit (INDEPENDENT_AMBULATORY_CARE_PROVIDER_SITE_OTHER): Payer: BC Managed Care – PPO | Admitting: Family Medicine

## 2020-07-05 ENCOUNTER — Other Ambulatory Visit: Payer: Self-pay

## 2020-07-05 ENCOUNTER — Encounter: Payer: Self-pay | Admitting: Family Medicine

## 2020-07-05 VITALS — BP 112/77 | HR 65 | Temp 98.4°F | Ht 65.0 in | Wt 249.0 lb

## 2020-07-05 DIAGNOSIS — R635 Abnormal weight gain: Secondary | ICD-10-CM | POA: Diagnosis not present

## 2020-07-05 DIAGNOSIS — E669 Obesity, unspecified: Secondary | ICD-10-CM

## 2020-07-05 DIAGNOSIS — F419 Anxiety disorder, unspecified: Secondary | ICD-10-CM

## 2020-07-05 NOTE — Assessment & Plan Note (Signed)
Stable.  Continue Lexapro 20mg daily

## 2020-07-05 NOTE — Patient Instructions (Signed)
It was very nice to see you today!  We will check blood work today.  Depending on the results we may need to start medication or referral to the weight management clinic.  We will touch base once we get your blood work back next week.  Take care, Dr [name]  PLEASE NOTE:  If you had any lab tests please let us know if you have not heard back within a few days. You may see your results on mychart before we have a chance to review them but we will give you a call once they are reviewed by [place]. If we ordered any referrals today, please let us know if you have not heard from their office within the next week.   Please try these tips to maintain a healthy lifestyle:   Eat at least 3 REAL meals and 1-2 snacks per day.  Aim for no more than 5 hours between eating.  If you eat breakfast, please do so within one hour of getting up.    Each meal should contain half fruits/vegetables, one quarter protein, and one quarter carbs (no bigger than a computer mouse)   Cut down on sweet beverages. This includes juice, soda, and sweet tea.     Drink at least 1 glass of water with each meal and aim for at least 8 glasses per day   Exercise at least 150 minutes every week.

## 2020-07-05 NOTE — Progress Notes (Signed)
   Tammy Knight is a 30 y.o. female who presents today for an office visit.  Assessment/Plan:  New/Acute Problems: Weight gain Will check labs today.  Discussed lifestyle modifications.  She has been off between the past and has done well with this.  Depending on results of labs we may restart phentermine or refer to weight management clinic.  Chronic Problems Addressed Today: Depression, major, single episode, in partial remission ([place]) Stable.  Continue Lexapro 20 mg daily.  Anxiety disorder Stable.  Continue Lexapro 20 mg daily.     Subjective:  HPI:  Patient here with weight gain.  She is up about 20 pounds over the last year.  She had a baby about 8 months ago.  She has had difficulty with managing weight since then.  She would like to have blood work done today to check her thyroid.  She feels like she has been in a good job with her diet and exercise.       Objective:  Physical Exam: BP 112/77   Pulse 65   Temp 98.4 F (36.9 C)   Ht 5\' 5"  (1.651 m)   Wt 249 lb (112.9 kg)   SpO2 100%   BMI 41.44 kg/m   Wt Readings from Last 3 Encounters:  07/05/20 249 lb (112.9 kg)  12/12/19 233 lb 3.2 oz (105.8 kg)  11/24/19 258 lb 8 oz (117.3 kg)  Gen: No acute distress, resting comfortably CV: Regular rate and rhythm with no murmurs appreciated Pulm: Normal work of breathing, clear to auscultation bilaterally with no crackles, wheezes, or rhonchi Neuro: Grossly normal, moves all extremities Psych: Normal affect and thought content      Ziya Coonrod M. [name], MD 07/05/2020 2:46 PM

## 2020-07-06 LAB — COMPREHENSIVE METABOLIC PANEL
AG Ratio: 1.9 (calc) (ref 1.0–2.5)
ALT: 15 U/L (ref 6–29)
AST: 16 U/L (ref 10–30)
Albumin: 4.4 g/dL (ref 3.6–5.1)
Alkaline phosphatase (APISO): 61 U/L (ref 31–125)
BUN: 18 mg/dL (ref 7–25)
CO2: 26 mmol/L (ref 20–32)
Calcium: 9.4 mg/dL (ref 8.6–10.2)
Chloride: 104 mmol/L (ref 98–110)
Creat: 0.6 mg/dL (ref 0.50–1.10)
Globulin: 2.3 g/dL (calc) (ref 1.9–3.7)
Glucose, Bld: 87 mg/dL (ref 65–99)
Potassium: 4.3 mmol/L (ref 3.5–5.3)
Sodium: 139 mmol/L (ref 135–146)
Total Bilirubin: 0.3 mg/dL (ref 0.2–1.2)
Total Protein: 6.7 g/dL (ref 6.1–8.1)

## 2020-07-06 LAB — CBC
HCT: 40.6 % (ref 35.0–45.0)
Hemoglobin: 13.6 g/dL (ref 11.7–15.5)
MCH: 31.6 pg (ref 27.0–33.0)
MCHC: 33.5 g/dL (ref 32.0–36.0)
MCV: 94.2 fL (ref 80.0–100.0)
MPV: 11 fL (ref 7.5–12.5)
Platelets: 266 10*3/uL (ref 140–400)
RBC: 4.31 10*6/uL (ref 3.80–5.10)
RDW: 12 % (ref 11.0–15.0)
WBC: 8.5 10*3/uL (ref 3.8–10.8)

## 2020-07-06 LAB — LIPID PANEL
Cholesterol: 155 mg/dL (ref <200)
HDL: 58 mg/dL (ref >=50)
LDL Cholesterol (Calc): 86 mg/dL (calc)
Non-HDL Cholesterol (Calc): 97 mg/dL (calc) (ref <130)
Total CHOL/HDL Ratio: 2.7 (calc) (ref <5.0)
Triglycerides: 40 mg/dL (ref <150)

## 2020-07-06 LAB — HEMOGLOBIN A1C
Hgb A1c MFr Bld: 5 % of total Hgb (ref <5.7)
Mean Plasma Glucose: 97 mg/dL
eAG (mmol/L): 5.4 mmol/L

## 2020-07-06 LAB — TSH: TSH: 1.99 mIU/L

## 2020-07-08 NOTE — Progress Notes (Signed)
Please inform patient of the following:  Labs are all normal.  No signs of thyroid issues. Would like for her to let us know if she wishes to start weight loss med or would like referral to weight management.  Algis Greenhouse. [name], MD 07/08/2020 8:41 AM

## 2020-07-09 ENCOUNTER — Encounter: Payer: Self-pay | Admitting: Family Medicine

## 2020-07-09 MED ORDER — PHENTERMINE HCL 15 MG PO CAPS: 15.0000 mg | ORAL_CAPSULE | ORAL | 0 refills | Status: DC

## 2020-09-20 ENCOUNTER — Ambulatory Visit (INDEPENDENT_AMBULATORY_CARE_PROVIDER_SITE_OTHER): Payer: BC Managed Care – PPO | Admitting: Family Medicine

## 2020-09-20 ENCOUNTER — Encounter: Payer: Self-pay | Admitting: Family Medicine

## 2020-09-20 ENCOUNTER — Other Ambulatory Visit: Payer: Self-pay

## 2020-09-20 VITALS — BP 106/71 | HR 62 | Temp 98.0°F | Ht 65.0 in | Wt 246.0 lb

## 2020-09-20 DIAGNOSIS — E669 Obesity, unspecified: Secondary | ICD-10-CM | POA: Diagnosis not present

## 2020-09-20 DIAGNOSIS — F324 Major depressive disorder, single episode, in partial remission: Secondary | ICD-10-CM | POA: Diagnosis not present

## 2020-09-20 DIAGNOSIS — F419 Anxiety disorder, unspecified: Secondary | ICD-10-CM | POA: Diagnosis not present

## 2020-09-20 MED ORDER — PHENTERMINE HCL 15 MG PO CAPS: 15.0000 mg | ORAL_CAPSULE | Freq: Two times a day (BID) | ORAL | 5 refills | Status: DC

## 2020-09-20 NOTE — Progress Notes (Signed)
   Tammy Knight is a 30 y.o. female who presents today for an office visit.  Assessment/Plan:  Chronic Problems Addressed Today: Obesity She is down about 3 pounds since her last visit.  Does like phentermine has been working well.  We will increase to 50 mg twice daily.  Discussed potential side effects.  Follow-up again with me in 3 months.  She will send me a message in a few weeks on how this is working for her.  Depression, major, single episode, in partial remission ([facility]) Stable on Lexapro 20 mg daily.  Anxiety disorder she has had a little bit more anxiety recently due to being a new parent.  Overall feels like symptoms are manageable.  We will continue Lexapro 20 mg daily for now.  She will let me know if symptoms worsen.    Subjective:  HPI:  See A/P.         Objective:  Physical Exam: BP 106/71   Pulse 62   Temp 98 F (36.7 C) (Temporal)   Ht 5\' 5"  (1.651 m)   Wt 246 lb (111.6 kg)   LMP 09/06/2019   SpO2 (!) 64%   BMI 40.94 kg/m   Wt Readings from Last 3 Encounters:  09/20/20 246 lb (111.6 kg)  07/05/20 249 lb (112.9 kg)  12/12/19 233 lb 3.2 oz (105.8 kg)    Gen: No acute distress, resting comfortably CV: Regular rate and rhythm with no murmurs appreciated Pulm: Normal work of breathing, clear to auscultation bilaterally with no crackles, wheezes, or rhonchi Neuro: Grossly normal, moves all extremities Psych: Normal affect and thought content      Beda Dula M. [name], MD 09/20/2020 1:30 PM

## 2020-09-20 NOTE — Patient Instructions (Signed)
It was very nice to see you today!  We will increase her phentermine to twice daily.  Please send me a message in a few weeks let me know how this is working for you.  I will see you back in 3 months.  Please come back to see me sooner if needed.  Take care, Dr [name]  PLEASE NOTE:  If you had any lab tests please let us know if you have not heard back within a few days. You may see your results on mychart before we have a chance to review them but we will give you a call once they are reviewed by [place]. If we ordered any referrals today, please let us know if you have not heard from their office within the next week.   Please try these tips to maintain a healthy lifestyle:   Eat at least 3 REAL meals and 1-2 snacks per day.  Aim for no more than 5 hours between eating.  If you eat breakfast, please do so within one hour of getting up.    Each meal should contain half fruits/vegetables, one quarter protein, and one quarter carbs (no bigger than a computer mouse)   Cut down on sweet beverages. This includes juice, soda, and sweet tea.     Drink at least 1 glass of water with each meal and aim for at least 8 glasses per day   Exercise at least 150 minutes every week.

## 2020-09-20 NOTE — Assessment & Plan Note (Signed)
She is down about 3 pounds since her last visit.  Does like phentermine has been working well.  We will increase to 50 mg twice daily.  Discussed potential side effects.  Follow-up again with me in 3 months.  She will send me a message in a few weeks on how this is working for her.

## 2020-09-20 NOTE — Assessment & Plan Note (Signed)
she has had a little bit more anxiety recently due to being a new parent.  Overall feels like symptoms are manageable.  We will continue Lexapro 20 mg daily for now.  She will let me know if symptoms worsen.

## 2020-09-20 NOTE — Assessment & Plan Note (Signed)
Stable on Lexapro 20 mg daily. 

## 2020-09-23 ENCOUNTER — Other Ambulatory Visit: Payer: Self-pay | Admitting: *Deleted

## 2020-09-23 ENCOUNTER — Telehealth: Payer: Self-pay | Admitting: *Deleted

## 2020-09-23 NOTE — Telephone Encounter (Signed)
Denied today Your PA request has been denied. Additional information will be provided in the denial communication. (Message [phone]) Your PA request has been denied.  Drug Phentermine HCl 15MG  capsules Form [employer] Electronic PA Form [phone] [employer]

## 2020-09-23 NOTE — Telephone Encounter (Signed)
Called patient, unable to LVM  Voice message full

## 2020-09-23 NOTE — Progress Notes (Unsigned)
Key: [id number] - PA Case ID: [id number] - Rx #: [id number] Need help? Call us at [phone] Status Sent to [employer] Drug Phentermine HCl 15MG  capsules Waiting for determination

## 2020-09-23 NOTE — Telephone Encounter (Signed)
Please let patient know.  Tammy Knight. [name], MD 09/23/2020 2:52 PM

## 2020-09-24 NOTE — Telephone Encounter (Signed)
Left detailed message via mychart

## 2020-09-24 NOTE — Telephone Encounter (Signed)
Not able to leave message due to voicemail full

## 2020-10-10 ENCOUNTER — Encounter: Payer: Self-pay | Admitting: Family Medicine

## 2020-10-29 ENCOUNTER — Encounter: Payer: Self-pay | Admitting: Family Medicine

## 2020-10-31 MED ORDER — PHENTERMINE HCL 15 MG PO CAPS: 15.0000 mg | ORAL_CAPSULE | Freq: Two times a day (BID) | ORAL | 5 refills | Status: DC

## 2020-12-12 ENCOUNTER — Encounter: Payer: BC Managed Care – PPO | Admitting: Family Medicine

## 2020-12-17 ENCOUNTER — Other Ambulatory Visit: Payer: Self-pay | Admitting: Family Medicine

## 2021-01-01 ENCOUNTER — Encounter: Payer: Self-pay | Admitting: Family Medicine

## 2021-01-10 ENCOUNTER — Encounter: Payer: Self-pay | Admitting: Family Medicine

## 2021-01-10 NOTE — Telephone Encounter (Signed)
Patient will faxed form for PCP to sign, no letter needed

## 2021-01-14 ENCOUNTER — Encounter: Payer: Self-pay | Admitting: Family Medicine

## 2021-01-14 NOTE — Telephone Encounter (Signed)
Form given to PCP to be sign

## 2021-01-15 ENCOUNTER — Encounter: Payer: Self-pay | Admitting: Family Medicine

## 2021-01-15 ENCOUNTER — Ambulatory Visit (INDEPENDENT_AMBULATORY_CARE_PROVIDER_SITE_OTHER): Payer: BC Managed Care – PPO | Admitting: Family Medicine

## 2021-01-15 ENCOUNTER — Other Ambulatory Visit: Payer: Self-pay

## 2021-01-15 VITALS — BP 134/70 | HR 98 | Temp 98.3°F | Ht 65.0 in | Wt 244.0 lb

## 2021-01-15 DIAGNOSIS — Z6839 Body mass index (BMI) 39.0-39.9, adult: Secondary | ICD-10-CM | POA: Diagnosis not present

## 2021-01-15 DIAGNOSIS — Z0001 Encounter for general adult medical examination with abnormal findings: Secondary | ICD-10-CM

## 2021-01-15 DIAGNOSIS — F324 Major depressive disorder, single episode, in partial remission: Secondary | ICD-10-CM

## 2021-01-15 DIAGNOSIS — Z6841 Body Mass Index (BMI) 40.0 and over, adult: Secondary | ICD-10-CM

## 2021-01-15 DIAGNOSIS — F419 Anxiety disorder, unspecified: Secondary | ICD-10-CM

## 2021-01-15 DIAGNOSIS — Z23 Encounter for immunization: Secondary | ICD-10-CM | POA: Diagnosis not present

## 2021-01-15 DIAGNOSIS — E669 Obesity, unspecified: Secondary | ICD-10-CM

## 2021-01-15 DIAGNOSIS — Z01419 Encounter for gynecological examination (general) (routine) without abnormal findings: Secondary | ICD-10-CM | POA: Diagnosis not present

## 2021-01-15 NOTE — Patient Instructions (Signed)
It was very nice to see you today!  Will place referral for you to see therapist.  Please let me know what your thoughts are on weight management. I think [facility] would be a good option.  We can also place a referral for you to see the weight management clinic.  I will see back in year for your next physical.  Please come back to see me sooner if needed.  Take care, Dr [name]  PLEASE NOTE:  If you had any lab tests please let us know if you have not heard back within a few days. You may see your results on mychart before we have a chance to review them but we will give you a call once they are reviewed by [place]. If we ordered any referrals today, please let us know if you have not heard from their office within the next week.   Please try these tips to maintain a healthy lifestyle:  Eat at least 3 REAL meals and 1-2 snacks per day.  Aim for no more than 5 hours between eating.  If you eat breakfast, please do so within one hour of getting up.   Each meal should contain half fruits/vegetables, one quarter protein, and one quarter carbs (no bigger than a computer mouse)  Cut down on sweet beverages. This includes juice, soda, and sweet tea.   Drink at least 1 glass of water with each meal and aim for at least 8 glasses per day  Exercise at least 150 minutes every week.    Preventive Care 45-49 Years Old, Female Preventive care refers to lifestyle choices and visits with your health care provider that can promote health and wellness. This includes: A yearly physical exam. This is also called an annual wellness visit. Regular dental and eye exams. Immunizations. Screening for certain conditions. Healthy lifestyle choices, such as: Eating a healthy diet. Getting regular exercise. Not using drugs or products that contain nicotine and tobacco. Limiting alcohol use. What can I expect for my preventive care visit? Physical exam Your health care provider may check your: Height and  weight. These may be used to calculate your BMI (body mass index). BMI is a measurement that tells if you are at a healthy weight. Heart rate and blood pressure. Body temperature. Skin for abnormal spots. Counseling Your health care provider may ask you questions about your: Past medical problems. Family's medical history. Alcohol, tobacco, and drug use. Emotional well-being. Home life and relationship well-being. Sexual activity. Diet, exercise, and sleep habits. Work and work Statistician. Access to firearms. Method of birth control. Menstrual cycle. Pregnancy history. What immunizations do I need? Vaccines are usually given at various ages, according to a schedule. Your health care provider will recommend vaccines for you based on your age, medical history, and lifestyle or other factors, such as travel or where you work. What tests do I need? Blood tests Lipid and cholesterol levels. These may be checked every 5 years starting at age 47. Hepatitis C test. Hepatitis B test. Screening Diabetes screening. This is done by checking your blood sugar (glucose) after you have not eaten for a while (fasting). STD (sexually transmitted disease) testing, if you are at risk. BRCA-related cancer screening. This may be done if you have a family history of breast, ovarian, tubal, or peritoneal cancers. Pelvic exam and Pap test. This may be done every 3 years starting at age 74. Starting at age 16, this may be done every 5 years if you have a  Pap test in combination with an HPV test. Talk with your health care provider about your test results, treatment options, and if necessary, the need for more tests. Follow these instructions at home: Eating and drinking  Eat a healthy diet that includes fresh fruits and vegetables, whole grains, lean protein, and low-fat dairy products. Take vitamin and mineral supplements as recommended by your health care provider. Do not drink alcohol if: Your health  care provider tells you not to drink. You are pregnant, may be pregnant, or are planning to become pregnant. If you drink alcohol: Limit how much you have to 0-1 drink a day. Be aware of how much alcohol is in your drink. In the [place] one drink equals one 12 oz bottle of beer (355 mL), one 5 oz glass of wine (148 mL), or one 1 oz glass of hard liquor (44 mL). Lifestyle Take daily care of your teeth and gums. Brush your teeth every morning and night with fluoride toothpaste. Floss one time each day. Stay active. Exercise for at least 30 minutes 5 or more days each week. Do not use any products that contain nicotine or tobacco, such as cigarettes, e-cigarettes, and chewing tobacco. If you need help quitting, ask your health care provider. Do not use drugs. If you are sexually active, practice safe sex. Use a condom or other form of protection to prevent STIs (sexually transmitted infections). If you do not wish to become pregnant, use a form of birth control. If you plan to become pregnant, see your health care provider for a prepregnancy visit. Find healthy ways to cope with stress, such as: Meditation, yoga, or listening to music. Journaling. Talking to a trusted person. Spending time with friends and family. Safety Always wear your seat belt while driving or riding in a vehicle. Do not drive: If you have been drinking alcohol. Do not ride with someone who has been drinking. When you are tired or distracted. While texting. Wear a helmet and other protective equipment during sports activities. If you have firearms in your house, make sure you follow all gun safety procedures. Seek help if you have been physically or sexually abused. What's next? Go to your health care provider once a year for an annual wellness visit. Ask your health care provider how often you should have your eyes and teeth checked. Stay up to date on all vaccines. This information is not intended to replace advice  given to you by your health care provider. Make sure you discuss any questions you have with your health care provider. Document Revised: 06/14/2020 Document Reviewed: 12/16/2017 Elsevier Patient Education  2022 [employer].

## 2021-01-15 NOTE — Assessment & Plan Note (Signed)
See above.  Symptoms are a little worse today but not unbearable.  Continue Lexapro 20 mg daily and see therapist.

## 2021-01-15 NOTE — Assessment & Plan Note (Signed)
Mildly worsened.  She thinks she may be having some mild issues with postpartum depression.  Symptoms are not overly debilitating at this time.  We will continue Lexapro 20 mg daily and place referral for her to see a therapist.

## 2021-01-15 NOTE — Progress Notes (Signed)
Chief Complaint:  Tammy Knight is a 30 y.o. female who presents today for her annual comprehensive physical exam.    Assessment/Plan:  Chronic Problems Addressed Today: Obesity She has been taking phentermine 15 mg twice daily.  She feels like this has helped some.  Minimal side effects.  Her blood pressure is well controlled today.  We discussed options going forward.  She is working on diet and exercise to the best of her ability.  We discussed potentially starting mounjaro for referring her to the weight management clinic.  She will consider these options and send me a MyChart message if she would like to pursue either of these.  Depression, major, single episode, in partial remission ([place]) Mildly worsened.  She thinks she may be having some mild issues with postpartum depression.  Symptoms are not overly debilitating at this time.  We will continue Lexapro 20 mg daily and place referral for her to see a therapist.  Anxiety disorder See above.  Symptoms are a little worse today but not unbearable.  Continue Lexapro 20 mg daily and see therapist.  Body mass index is 40.6 kg/m. / Obese  BMI Metric Follow Up - 01/15/21 0942       BMI Metric Follow Up-Please document annually   BMI Metric Follow Up Education provided             Preventative Healthcare: Check Labs. Flu shot given today. UTD on screenings.   Patient Counseling(The following topics were reviewed and/or handout was given):  -Nutrition: Stressed importance of moderation in sodium/caffeine intake, saturated fat and cholesterol, caloric balance, sufficient intake of fresh fruits, vegetables, and fiber.  -Stressed the importance of regular exercise.   -Substance Abuse: Discussed cessation/primary prevention of tobacco, alcohol, or other drug use; driving or other dangerous activities under the influence; availability of treatment for abuse.   -Injury prevention: Discussed safety belts, safety helmets, smoke  detector, smoking near bedding or upholstery.   -Sexuality: Discussed sexually transmitted diseases, partner selection, use of condoms, avoidance of unintended pregnancy and contraceptive alternatives.   -Dental health: Discussed importance of regular tooth brushing, flossing, and dental visits.  -Health maintenance and immunizations reviewed. Please refer to Health maintenance section.  Return to care in 1 year for next preventative visit.     Subjective:  HPI:  She has no acute complaints today.   She states that the post-partum depression is likely causing her to withdraw from her larger friend group, as well as a decrease of interest in previous activities. Additionally she has concerns over her body image. Stress eating is an attribute which she notes struggling with.  She does not state that the condition is unbearable or debilitating, and has found time to go to a women's bible studies to interact with people. However, she does express interest in seeing a therapist to help alleviate the situation.   Exercising makes her feel better, but has trouble allotting the time to make sure she can.  Lifestyle Diet: Is trying to improve. Exercise: Does exercise, but has to get up earlier than she currently does to guarantee timeframe.  Depression screen [facility] 2/9 07/05/2020  Decreased Interest 0  Down, Depressed, Hopeless 0  PHQ - 2 Score 0  Altered sleeping 0  Tired, decreased energy 1  Change in appetite 0  Feeling bad or failure about yourself  0  Trouble concentrating 0  Moving slowly or fidgety/restless 0  Suicidal thoughts 0  PHQ-9 Score 1  Difficult doing work/chores  Somewhat difficult    Health Maintenance Due  Topic Date Due   Hepatitis C Screening  Never done   COVID-19 Vaccine (3 - Booster for Moderna series) 06/22/2020   INFLUENZA VACCINE  11/18/2020     ROS: Per HPI, otherwise a complete review of systems was negative.   PMH:  The following were reviewed and  entered/updated in epic: Past Medical History:  Diagnosis Date   Asthma    h/o no inhalers   Chlamydia    Depression    Hay fever    UTI (urinary tract infection)    Patient Active Problem List   Diagnosis Date Noted   Obesity 09/20/2020   Depression, major, single episode, in partial remission ([place]) 06/09/2018   PMS (premenstrual syndrome) 12/13/2017   Anxiety disorder 08/13/2017   Past Surgical History:  Procedure Laterality Date   ABDOMINOPLASTY  2014   CESAREAN SECTION N/A 11/24/2019   Procedure: CESAREAN SECTION;  Surgeon: Everlene Farrier, MD;  Location: [facility];  Service: Obstetrics;  Laterality: N/A;   LAPAROSCOPIC SALPINGO OOPHERECTOMY Right 06/13/2018   Procedure: LAPAROSCOPIC RIGHT  OOPHORECTOMY;  Surgeon: Harlin Heys, MD;  Location: [facility] ORS;  Service: Gynecology;  Laterality: Right;   TONSILLECTOMY      Family History  Problem Relation Age of Onset   Heart attack Father        2014   Alcohol abuse Father    Depression Father    Hyperlipidemia Father    Hypertension Father    COPD Mother    Glaucoma Mother    Arthritis Mother    Depression Mother    Depression Sister    Hearing loss Sister    Birth defects Sister        no thyroid   Diabetes Paternal Grandfather    Breast cancer Maternal Aunt    Ovarian cancer Neg Hx    Colon cancer Neg Hx     Medications- reviewed and updated Current Outpatient Medications  Medication Sig Dispense Refill   escitalopram (LEXAPRO) 20 MG tablet TAKE 1 TABLET BY MOUTH EVERY DAY 90 tablet 1   Prenatal Vit-Fe Fumarate-FA (PRENATAL 19) 29-1 MG CHEW Chew 2 each by mouth daily.     No current facility-administered medications for this visit.    Allergies-reviewed and updated Allergies  Allergen Reactions   Penicillins Anaphylaxis   Bee Venom Swelling    Social History   Socioeconomic History   Marital status: Single    Spouse name: Not on file   Number of children: Not on file   Years of education: Not on  file   Highest education level: Not on file  Occupational History   Not on file  Tobacco Use   Smoking status: Former    Years: 5.00    Types: Cigarettes    Quit date: 01/07/2018    Years since quitting: 3.0   Smokeless tobacco: Never   Tobacco comments:    1-2 cig per day  Vaping Use   Vaping Use: Never used  Substance and Sexual Activity   Alcohol use: Not Currently    Alcohol/week: 5.0 standard drinks    Types: 5 Glasses of wine per week   Drug use: No   Sexual activity: Yes    Partners: Male    Birth control/protection: None  Other Topics Concern   Not on file  Social History Narrative   Not on file   Social Determinants of Health   Financial Resource Strain: Not on file  Food Insecurity: Not on file  Transportation Needs: Not on file  Physical Activity: Not on file  Stress: Not on file  Social Connections: Not on file        Objective:  Physical Exam: BP 134/70   Pulse 98   Temp 98.3 F (36.8 C) (Temporal)   Ht 5\' 5"  (1.651 m)   Wt 244 lb (110.7 kg)   LMP 12/22/2020   SpO2 98%   BMI 40.60 kg/m   Body mass index is 40.6 kg/m. Wt Readings from Last 3 Encounters:  01/15/21 244 lb (110.7 kg)  09/20/20 246 lb (111.6 kg)  07/05/20 249 lb (112.9 kg)   Gen: NAD, resting comfortably HEENT: TMs normal bilaterally. OP clear. No thyromegaly noted.  CV: RRR with no murmurs appreciated Pulm: NWOB, CTAB with no crackles, wheezes, or rhonchi GI: Normal bowel sounds present. Soft, Nontender, Nondistended. MSK: no edema, cyanosis, or clubbing noted Skin: warm, dry Neuro: CN2-12 grossly intact. Strength 5/5 in upper and lower extremities. Reflexes symmetric and intact bilaterally.  Psych: Normal affect and thought content     I,Jordan Kelly,acting as a scribe for Dimas Chyle, MD.,have documented all relevant documentation on the behalf of Dimas Chyle, MD,as directed by  Dimas Chyle, MD while in the presence of Dimas Chyle, MD.  I, Dimas Chyle, MD, have  reviewed all documentation for this visit. The documentation on 01/15/21 for the exam, diagnosis, procedures, and orders are all accurate and complete.  Algis Greenhouse. [name], MD 01/15/2021 9:43 AM

## 2021-01-15 NOTE — Assessment & Plan Note (Signed)
She has been taking phentermine 15 mg twice daily.  She feels like this has helped some.  Minimal side effects.  Her blood pressure is well controlled today.  We discussed options going forward.  She is working on diet and exercise to the best of her ability.  We discussed potentially starting mounjaro for referring her to the weight management clinic.  She will consider these options and send me a MyChart message if she would like to pursue either of these.

## 2021-01-17 ENCOUNTER — Encounter: Payer: Self-pay | Admitting: Family Medicine

## 2021-01-17 ENCOUNTER — Other Ambulatory Visit: Payer: Self-pay

## 2021-01-17 MED ORDER — TIRZEPATIDE 2.5 MG/0.5ML ~~LOC~~ SOAJ
2.5000 mg | SUBCUTANEOUS | 3 refills | Status: DC
Start: 2021-01-17 — End: 2021-03-05

## 2021-01-28 NOTE — Telephone Encounter (Signed)
See note

## 2021-03-03 ENCOUNTER — Other Ambulatory Visit: Payer: Self-pay

## 2021-03-03 ENCOUNTER — Emergency Department (HOSPITAL_COMMUNITY)
Admission: EM | Admit: 2021-03-03 | Discharge: 2021-03-03 | Disposition: A | Discharge status: Home / Self Care | Payer: BC Managed Care – PPO | Attending: Emergency Medicine | Admitting: Emergency Medicine

## 2021-03-03 ENCOUNTER — Encounter (HOSPITAL_COMMUNITY): Payer: Self-pay | Admitting: *Deleted

## 2021-03-03 DIAGNOSIS — M791 Myalgia, unspecified site: Secondary | ICD-10-CM | POA: Insufficient documentation

## 2021-03-03 DIAGNOSIS — Z5321 Procedure and treatment not carried out due to patient leaving prior to being seen by health care provider: Secondary | ICD-10-CM | POA: Insufficient documentation

## 2021-03-03 DIAGNOSIS — R519 Headache, unspecified: Secondary | ICD-10-CM | POA: Insufficient documentation

## 2021-03-03 NOTE — ED Triage Notes (Signed)
Pt c/o generalized body aches with headache that started yesterday and got worse today

## 2021-03-05 ENCOUNTER — Telehealth (INDEPENDENT_AMBULATORY_CARE_PROVIDER_SITE_OTHER): Payer: Self-pay | Admitting: Family Medicine

## 2021-03-05 ENCOUNTER — Encounter: Payer: Self-pay | Admitting: Family Medicine

## 2021-03-05 VITALS — Temp 97.7°F | Ht 65.0 in | Wt 245.0 lb

## 2021-03-05 DIAGNOSIS — U071 COVID-19: Secondary | ICD-10-CM

## 2021-03-05 MED ORDER — NIRMATRELVIR/RITONAVIR (PAXLOVID)TABLET
3.0000 | ORAL_TABLET | Freq: Two times a day (BID) | ORAL | 0 refills | Status: AC
Start: 2021-03-05 — End: 2021-03-10

## 2021-03-05 NOTE — Progress Notes (Signed)
   Jewelene Talisa Petrak is a 30 y.o. female who presents today for a telephone visit.  Assessment/Plan:  COVID No red flags.  Start paxlovid.  Encourage good oral hydration.  She can continue over-the-counter meds.  Discussed reasons return to care.    Subjective:  HPI:  Patient here with covid.  Symptoms started 3 days ago. Had severe headache. Symptoms have progressed to include myalgias, fever, chills, and nasal congestion. Home test yesterday was postive for covid. Symptoms seem to be improving slightly. No reported chest pain or shortness of breath.        Objective/Observations   NAD  Telephone Visit   I connected with Michaela Broski on 03/05/21 at 10:00 AM EST via telephone and verified that I am speaking with the correct person using two identifiers. I discussed the limitations of evaluation and management by telemedicine and the availability of in person appointments. The patient expressed understanding and agreed to proceed.   Patient location: Home Provider location: [place] participating in the virtual visit: Myself and Patient  A total of 11 minutes were spent on medical discussion.      Algis Greenhouse. [name], MD 03/05/2021 10:20 AM

## 2021-06-02 DIAGNOSIS — F3281 Premenstrual dysphoric disorder: Secondary | ICD-10-CM | POA: Diagnosis not present

## 2021-06-24 DIAGNOSIS — M9905 Segmental and somatic dysfunction of pelvic region: Secondary | ICD-10-CM | POA: Diagnosis not present

## 2021-06-24 DIAGNOSIS — M62830 Muscle spasm of back: Secondary | ICD-10-CM | POA: Diagnosis not present

## 2021-06-24 DIAGNOSIS — M9903 Segmental and somatic dysfunction of lumbar region: Secondary | ICD-10-CM | POA: Diagnosis not present

## 2021-06-24 DIAGNOSIS — M9901 Segmental and somatic dysfunction of cervical region: Secondary | ICD-10-CM | POA: Diagnosis not present

## 2021-06-24 DIAGNOSIS — M9902 Segmental and somatic dysfunction of thoracic region: Secondary | ICD-10-CM | POA: Diagnosis not present

## 2021-06-26 DIAGNOSIS — M9902 Segmental and somatic dysfunction of thoracic region: Secondary | ICD-10-CM | POA: Diagnosis not present

## 2021-06-26 DIAGNOSIS — M9903 Segmental and somatic dysfunction of lumbar region: Secondary | ICD-10-CM | POA: Diagnosis not present

## 2021-06-26 DIAGNOSIS — M62830 Muscle spasm of back: Secondary | ICD-10-CM | POA: Diagnosis not present

## 2021-06-26 DIAGNOSIS — M9905 Segmental and somatic dysfunction of pelvic region: Secondary | ICD-10-CM | POA: Diagnosis not present

## 2021-06-26 DIAGNOSIS — M9901 Segmental and somatic dysfunction of cervical region: Secondary | ICD-10-CM | POA: Diagnosis not present

## 2021-07-08 DIAGNOSIS — I8311 Varicose veins of right lower extremity with inflammation: Secondary | ICD-10-CM | POA: Diagnosis not present

## 2021-07-08 DIAGNOSIS — M25551 Pain in right hip: Secondary | ICD-10-CM | POA: Diagnosis not present

## 2021-07-08 DIAGNOSIS — S8391XA Sprain of unspecified site of right knee, initial encounter: Secondary | ICD-10-CM | POA: Diagnosis not present

## 2021-07-08 DIAGNOSIS — I8312 Varicose veins of left lower extremity with inflammation: Secondary | ICD-10-CM | POA: Diagnosis not present

## 2021-07-09 DIAGNOSIS — I8311 Varicose veins of right lower extremity with inflammation: Secondary | ICD-10-CM | POA: Diagnosis not present

## 2021-07-09 DIAGNOSIS — I8312 Varicose veins of left lower extremity with inflammation: Secondary | ICD-10-CM | POA: Diagnosis not present

## 2021-07-30 ENCOUNTER — Other Ambulatory Visit: Payer: Self-pay | Admitting: Family Medicine

## 2021-08-04 DIAGNOSIS — F3281 Premenstrual dysphoric disorder: Secondary | ICD-10-CM | POA: Diagnosis not present

## 2021-08-11 ENCOUNTER — Ambulatory Visit (INDEPENDENT_AMBULATORY_CARE_PROVIDER_SITE_OTHER): Payer: BC Managed Care – PPO | Admitting: Family

## 2021-08-11 ENCOUNTER — Encounter: Payer: Self-pay | Admitting: Family

## 2021-08-11 VITALS — BP 127/79 | HR 60 | Temp 97.6°F | Ht 65.0 in | Wt 246.5 lb

## 2021-08-11 DIAGNOSIS — N3090 Cystitis, unspecified without hematuria: Secondary | ICD-10-CM | POA: Diagnosis not present

## 2021-08-11 LAB — POCT URINALYSIS DIPSTICK
Bilirubin, UA: NEGATIVE
Blood, UA: NEGATIVE
Glucose, UA: NEGATIVE
Ketones, UA: NEGATIVE
Nitrite, UA: NEGATIVE
Protein, UA: NEGATIVE
Spec Grav, UA: 1.025 (ref 1.010–1.025)
Urobilinogen, UA: 0.2 E.U./dL
pH, UA: 6 (ref 5.0–8.0)

## 2021-08-11 MED ORDER — SULFAMETHOXAZOLE-TRIMETHOPRIM 800-160 MG PO TABS: 1.0000 | ORAL_TABLET | Freq: Two times a day (BID) | ORAL | 0 refills | Status: DC

## 2021-08-11 NOTE — Progress Notes (Signed)
? ?Subjective:  ? ? ? Patient ID: Tammy Knight, female    DOB: [birth year], 31 y.o.   MRN: [id number] ? ?Chief Complaint  ?Patient presents with  ? Dysuria  ?  Pt c/o urgency to urinate, nd dysuria. Present since Friday. AZO did help .   ? ?HPI: ?Urinary symptoms: Patient c/o  dysuria, frequency, urgency,foul odor, cloudy urine, hematuria. Other sx: vaginal d/c none, vaginal itching none. Duration of sx: 3days; Home tx: AZO for 3 days; Denies  nausea, fever. Reports last UTI years ago. ? ? ?Health Maintenance Due  ?Topic Date Due  ? Hepatitis C Screening  Never done  ? COVID-19 Vaccine (4 - Booster) 03/19/2020  ? PAP-Cervical Cytology Screening  06/09/2021  ? PAP SMEAR-Modifier  06/09/2021  ? ? ?Past Medical History:  ?Diagnosis Date  ? Asthma   ? h/o no inhalers  ? Chlamydia   ? Depression   ? Hay fever   ? UTI (urinary tract infection)   ? ? ?Past Surgical History:  ?Procedure Laterality Date  ? ABDOMINOPLASTY  2014  ? CESAREAN SECTION N/A 11/24/2019  ? Procedure: CESAREAN SECTION;  Surgeon: Everlene Farrier, MD;  Location: [place] [facility];  Service: Obstetrics;  Laterality: N/A;  ? LAPAROSCOPIC SALPINGO OOPHERECTOMY Right 06/13/2018  ? Procedure: LAPAROSCOPIC RIGHT  OOPHORECTOMY;  Surgeon: Harlin Heys, MD;  Location: [facility] ORS;  Service: Gynecology;  Laterality: Right;  ? TONSILLECTOMY    ? ? ?Outpatient Medications Prior to Visit  ?Medication Sig Dispense Refill  ? escitalopram (LEXAPRO) 20 MG tablet TAKE 1 TABLET BY MOUTH EVERY DAY 90 tablet 1  ? SYEDA 3-0.03 MG tablet Take 1 tablet by mouth daily.    ? ?No facility-administered medications prior to visit.  ? ? ?Allergies  ?Allergen Reactions  ? Penicillins Anaphylaxis  ? Bee Venom Swelling  ? ? ? ?   ?Objective:  ?  ?Physical Exam ?Vitals and nursing note reviewed.  ?Constitutional:   ?   Appearance: Normal appearance.  ?Cardiovascular:  ?   Rate and Rhythm: Normal rate and regular rhythm.  ?Pulmonary:  ?   Effort: Pulmonary effort is normal.  ?   Breath  sounds: Normal breath sounds.  ?Musculoskeletal:     ?   General: Normal range of motion.  ?Skin: ?   General: Skin is warm and dry.  ?Neurological:  ?   Mental Status: She is alert.  ?Psychiatric:     ?   Mood and Affect: Mood normal.     ?   Behavior: Behavior normal.  ? ? ?BP 127/79 (BP Location: Left Arm, Patient Position: Sitting, Cuff Size: Large)   Pulse 60   Temp 97.6 ?F (36.4 ?C) (Temporal)   Ht '5\' 5"'$  (1.651 m)   Wt 246 lb 8 oz (111.8 kg)   LMP 07/28/2021 (Exact Date)   SpO2 97%   Breastfeeding No   BMI 41.02 kg/m?  ?Wt Readings from Last 3 Encounters:  ?08/11/21 246 lb 8 oz (111.8 kg)  ?03/05/21 245 lb (111.1 kg)  ?03/03/21 245 lb (111.1 kg)  ? ? ?   ?Assessment & Plan:  ? ?Problem List Items Addressed This Visit   ?None ?Visit Diagnoses   ? ? Cystitis    -  Primary  ? sending Bactrim, pt advised on use & SE ? ?Relevant Medications  ? sulfamethoxazole-trimethoprim (BACTRIM DS) 800-160 MG tablet  ? Other Relevant Orders  ? POCT Urinalysis Dipstick (Completed)  ? ?  ? ?Meds ordered this encounter  ?  Medications  ? sulfamethoxazole-trimethoprim (BACTRIM DS) 800-160 MG tablet  ?  Sig: Take 1 tablet by mouth 2 (two) times daily after a meal.  ?  Dispense:  6 tablet  ?  Refill:  0  ?  Order Specific Question:   Supervising Provider  ?  Answer:   ANDY, CAMILLE L [[id number]]  ? ? ?Jeanie Sewer, NP ? ?

## 2021-08-15 ENCOUNTER — Encounter: Payer: Self-pay | Admitting: Family

## 2021-08-15 DIAGNOSIS — N3090 Cystitis, unspecified without hematuria: Secondary | ICD-10-CM

## 2021-10-06 IMAGING — US US EXTREM LOW VENOUS*L*
1 series · 13 of 24 positions shown · non-contrast
Comparison: None.

CLINICAL DATA: Left lower extremity edema. Patient is currently
pregnant. Evaluate for DVT.



[Series 1: us venous img lower uni left (dvt) · portal-venous · 13 of 35 slices shown]
[im 1/35]
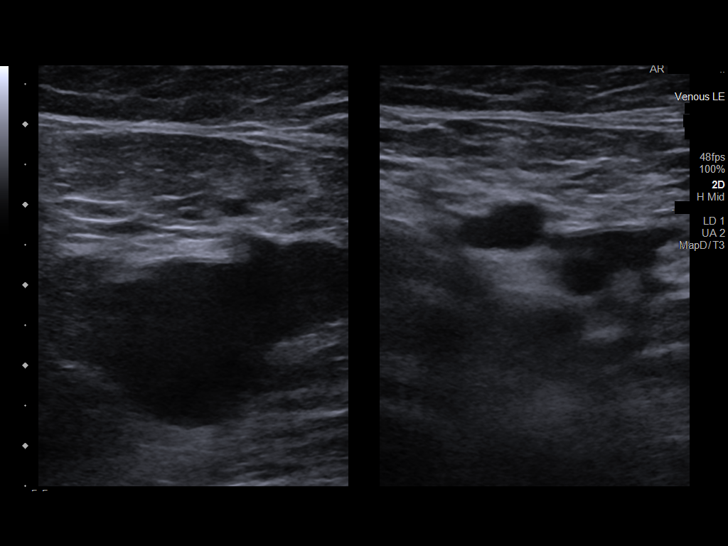
[im 3/35]
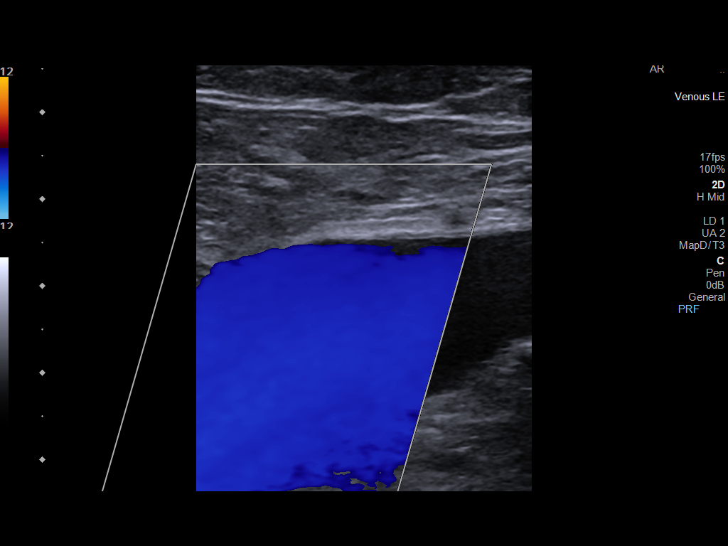
[im 6/35]
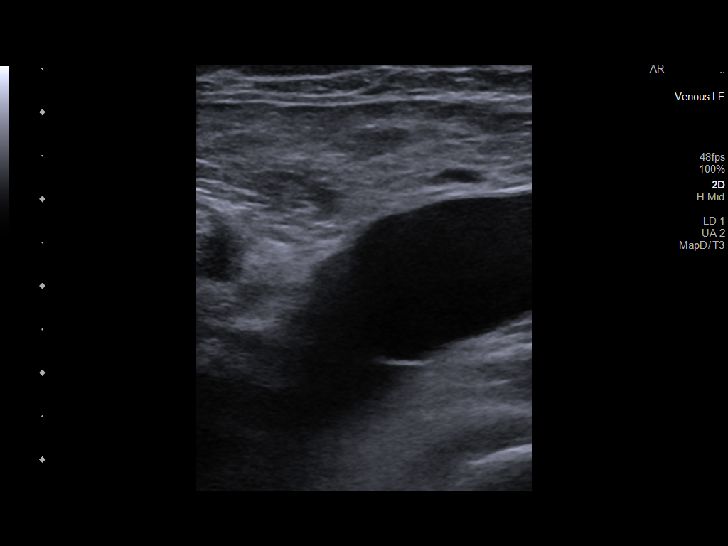
[im 9/35]
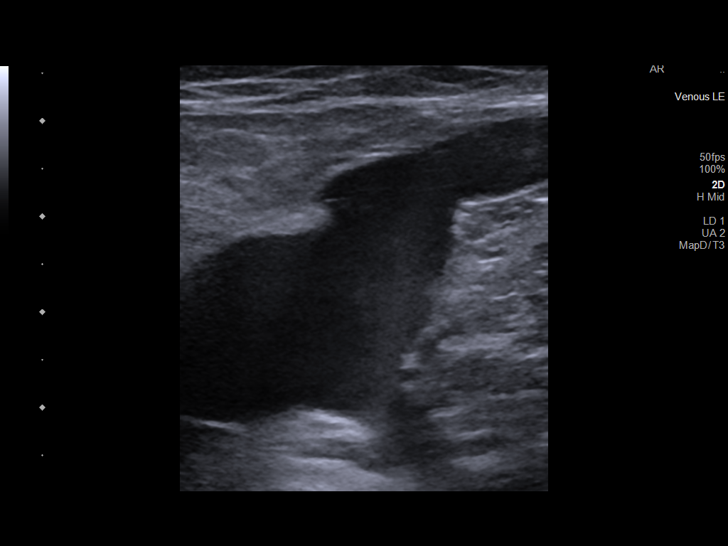
[im 12/35]
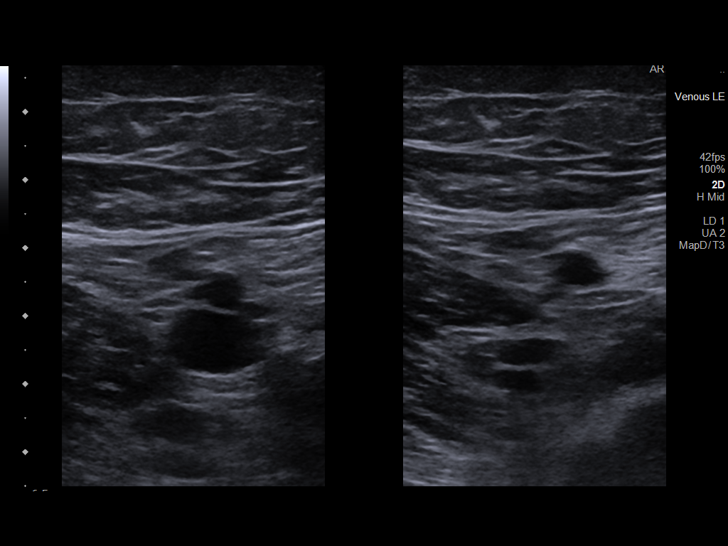
[im 15/35]
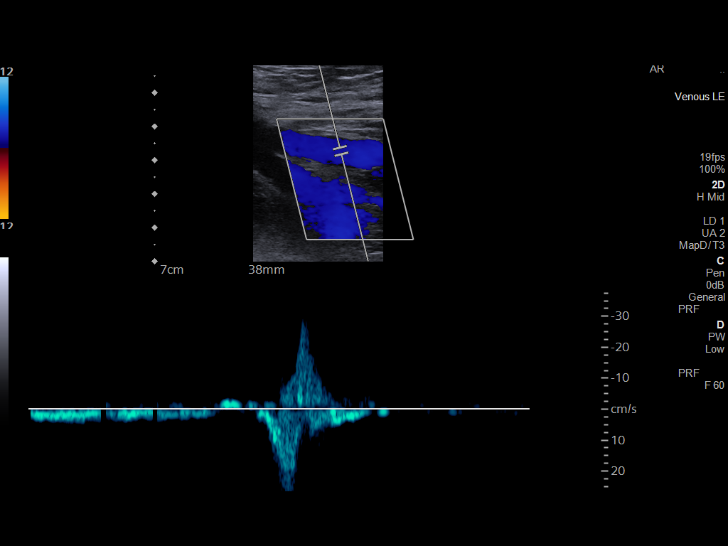
[im 18/35]
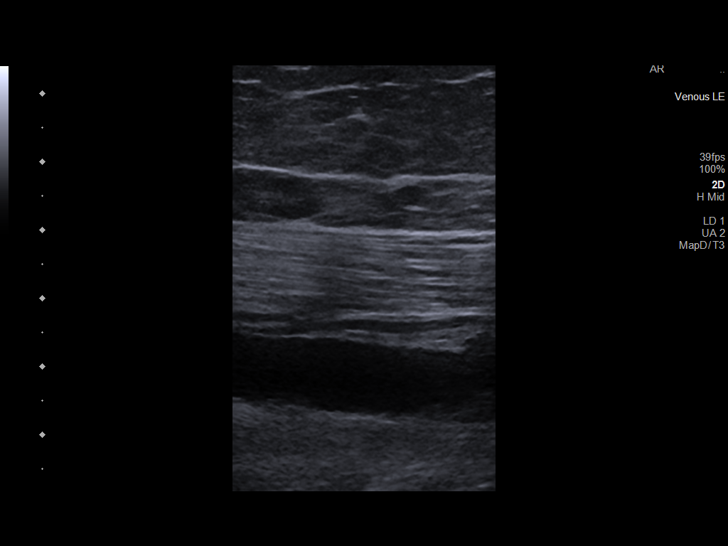
[im 20/35]
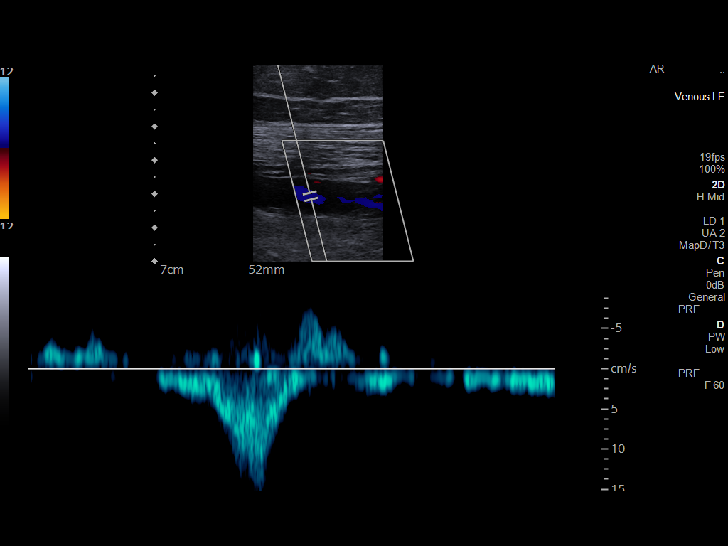
[im 23/35]
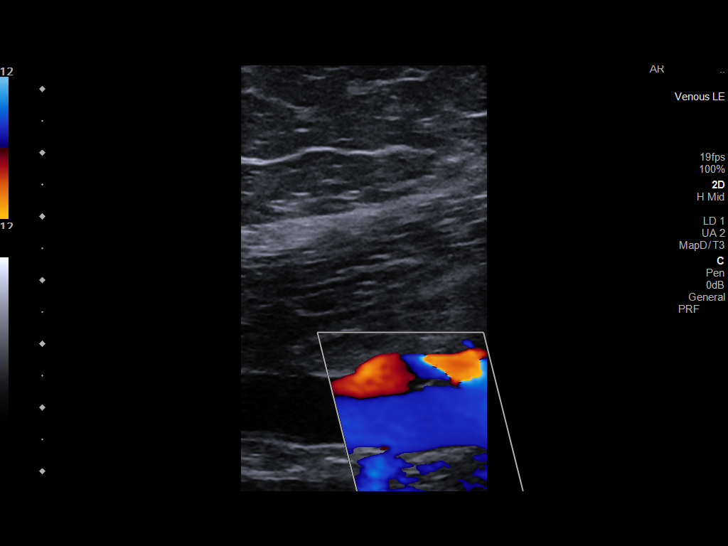
[im 26/35]
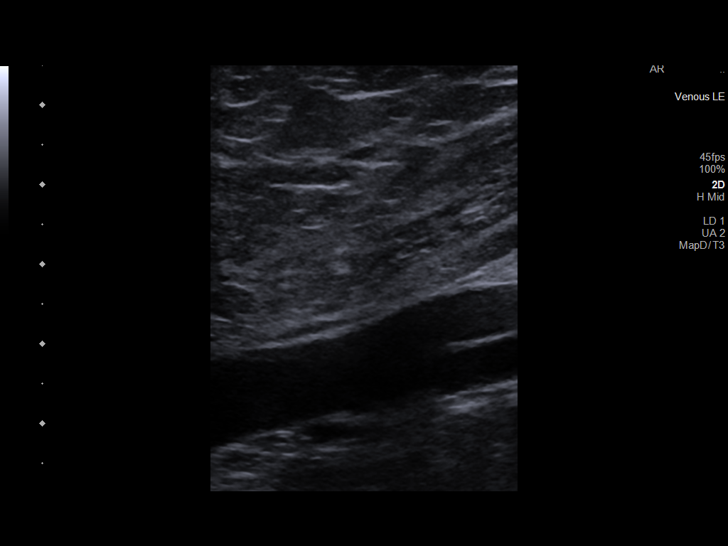
[im 29/35]
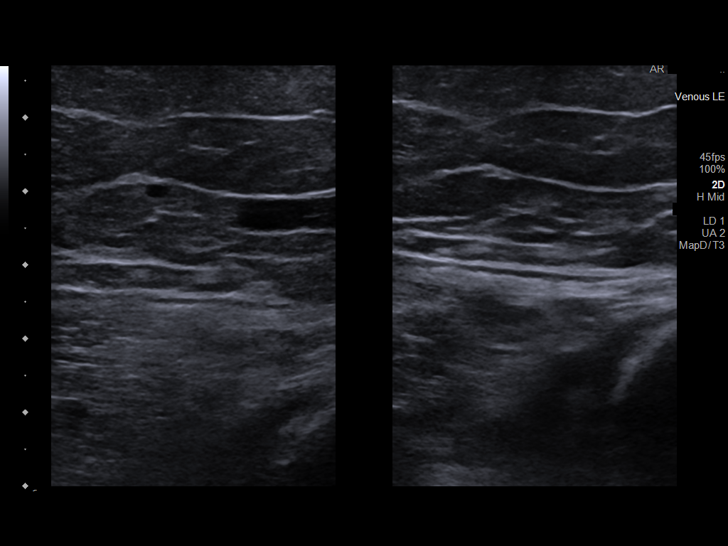
[im 32/35]
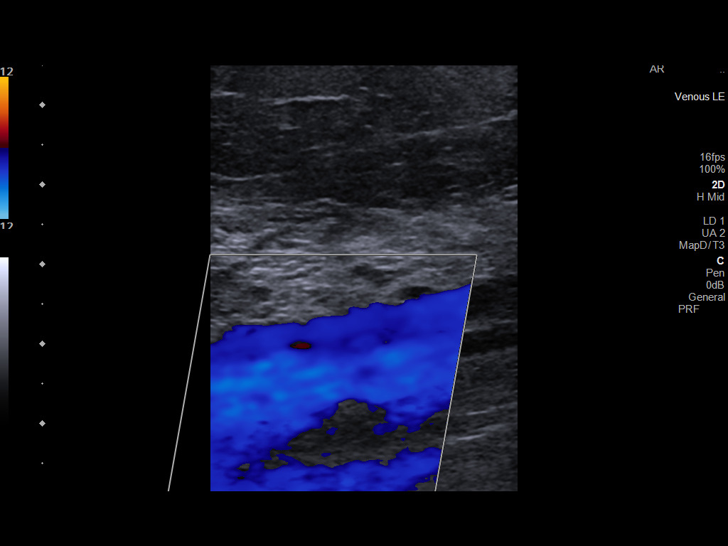
[im 35/35]
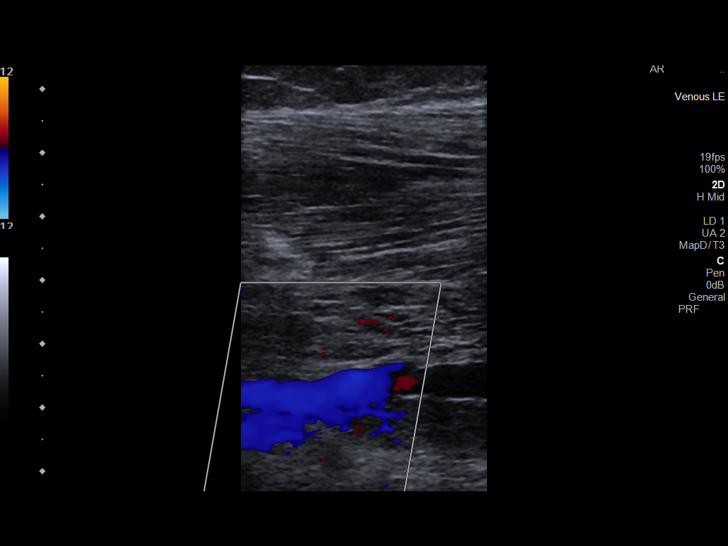

[13 of 24 positions shown; findings below may reference images not displayed]

FINDINGS: Contralateral Common Femoral Vein: Respiratory phasicity is normal
and symmetric with the symptomatic side. No evidence of thrombus.
Normal compressibility.

Common Femoral Vein: No evidence of thrombus. Normal
compressibility, respiratory phasicity and response to augmentation.

Saphenofemoral Junction: No evidence of thrombus. Normal
compressibility and flow on color Doppler imaging.

Profunda Femoral Vein: No evidence of thrombus. Normal
compressibility and flow on color Doppler imaging.

Femoral Vein: No evidence of thrombus. Normal compressibility,
respiratory phasicity and response to augmentation.

Popliteal Vein: No evidence of thrombus. Normal compressibility,
respiratory phasicity and response to augmentation.

Calf Veins: No evidence of thrombus. Normal compressibility and flow
on color Doppler imaging.

Superficial Great Saphenous Vein: No evidence of thrombus. Normal
compressibility.

Venous Reflux:  None.

Other Findings:  None.
IMPRESSION: No evidence of DVT within the left lower extremity.

## 2021-12-09 ENCOUNTER — Other Ambulatory Visit: Payer: Self-pay

## 2021-12-09 ENCOUNTER — Telehealth: Payer: Self-pay | Admitting: Family Medicine

## 2021-12-09 ENCOUNTER — Encounter (HOSPITAL_COMMUNITY): Payer: Self-pay

## 2021-12-09 ENCOUNTER — Encounter: Payer: Self-pay | Admitting: Family Medicine

## 2021-12-09 ENCOUNTER — Emergency Department (HOSPITAL_COMMUNITY): Payer: BC Managed Care – PPO

## 2021-12-09 ENCOUNTER — Emergency Department (HOSPITAL_COMMUNITY)
Admission: EM | Admit: 2021-12-09 | Discharge: 2021-12-09 | Disposition: A | Discharge status: Home / Self Care | Payer: BC Managed Care – PPO | Attending: Emergency Medicine | Admitting: Emergency Medicine

## 2021-12-09 DIAGNOSIS — S0081XA Abrasion of other part of head, initial encounter: Secondary | ICD-10-CM | POA: Diagnosis not present

## 2021-12-09 DIAGNOSIS — R55 Syncope and collapse: Secondary | ICD-10-CM | POA: Diagnosis not present

## 2021-12-09 DIAGNOSIS — R079 Chest pain, unspecified: Secondary | ICD-10-CM | POA: Insufficient documentation

## 2021-12-09 DIAGNOSIS — S0992XA Unspecified injury of nose, initial encounter: Secondary | ICD-10-CM | POA: Diagnosis not present

## 2021-12-09 DIAGNOSIS — W228XXA Striking against or struck by other objects, initial encounter: Secondary | ICD-10-CM | POA: Diagnosis not present

## 2021-12-09 DIAGNOSIS — S022XXA Fracture of nasal bones, initial encounter for closed fracture: Secondary | ICD-10-CM | POA: Diagnosis not present

## 2021-12-09 DIAGNOSIS — Z043 Encounter for examination and observation following other accident: Secondary | ICD-10-CM | POA: Diagnosis not present

## 2021-12-09 LAB — BASIC METABOLIC PANEL
Anion gap: 4 — ABNORMAL LOW (ref 5–15)
BUN: 9 mg/dL (ref 6–20)
CO2: 26 mmol/L (ref 22–32)
Calcium: 8.2 mg/dL — ABNORMAL LOW (ref 8.9–10.3)
Chloride: 108 mmol/L (ref 98–111)
Creatinine, Ser: 0.58 mg/dL (ref 0.44–1.00)
GFR, Estimated: >60 mL/min (ref >60)
Glucose, Bld: 97 mg/dL (ref 70–99)
Potassium: 3.8 mmol/L (ref 3.5–5.1)
Sodium: 138 mmol/L (ref 135–145)

## 2021-12-09 LAB — CBC WITH DIFFERENTIAL/PLATELET
Abs Immature Granulocytes: 0.03 10*3/uL (ref 0.00–0.07)
Basophils Absolute: 0 10*3/uL (ref 0.0–0.1)
Basophils Relative: 0 %
Eosinophils Absolute: 0.1 10*3/uL (ref 0.0–0.5)
Eosinophils Relative: 1 %
HCT: 38.7 % (ref 36.0–46.0)
Hemoglobin: 13.2 g/dL (ref 12.0–15.0)
Immature Granulocytes: 0 %
Lymphocytes Relative: 9 %
Lymphs Abs: 0.7 10*3/uL (ref 0.7–4.0)
MCH: 31.2 pg (ref 26.0–34.0)
MCHC: 34.1 g/dL (ref 30.0–36.0)
MCV: 91.5 fL (ref 80.0–100.0)
Monocytes Absolute: 0.8 10*3/uL (ref 0.1–1.0)
Monocytes Relative: 9 %
Neutro Abs: 6.8 10*3/uL (ref 1.7–7.7)
Neutrophils Relative %: 81 %
Platelets: 258 10*3/uL (ref 150–400)
RBC: 4.23 MIL/uL (ref 3.87–5.11)
RDW: 12.5 % (ref 11.5–15.5)
WBC: 8.4 10*3/uL (ref 4.0–10.5)
nRBC: 0 % (ref 0.0–0.2)

## 2021-12-09 LAB — URINALYSIS, ROUTINE W REFLEX MICROSCOPIC
Bilirubin Urine: NEGATIVE
Glucose, UA: NEGATIVE mg/dL
Hgb urine dipstick: NEGATIVE
Ketones, ur: NEGATIVE mg/dL
Leukocytes,Ua: NEGATIVE
Nitrite: NEGATIVE
Protein, ur: NEGATIVE mg/dL
Specific Gravity, Urine: 1.008 (ref 1.005–1.030)
pH: 8 (ref 5.0–8.0)

## 2021-12-09 LAB — TSH: TSH: 1.401 u[IU]/mL (ref 0.350–4.500)

## 2021-12-09 LAB — POC URINE PREG, ED: Preg Test, Ur: NEGATIVE

## 2021-12-09 LAB — CBG MONITORING, ED: Glucose-Capillary: 64 mg/dL — ABNORMAL LOW (ref 70–99)

## 2021-12-09 LAB — TROPONIN I (HIGH SENSITIVITY): Troponin I (High Sensitivity): <2 ng/L (ref <18)

## 2021-12-09 MED ORDER — SODIUM CHLORIDE 0.9 % IV BOLUS
1000.0000 mL | Freq: Once | INTRAVENOUS | Status: AC
  Administered 2021-12-09: 1000 mL via INTRAVENOUS

## 2021-12-09 MED ORDER — ACETAMINOPHEN 325 MG PO TABS
650.0000 mg | ORAL_TABLET | Freq: Once | ORAL | Status: AC
  Administered 2021-12-09: 650 mg via ORAL
  Filled 2021-12-09: qty 2

## 2021-12-09 NOTE — Telephone Encounter (Signed)
Please schedule OV with Dr [name] to F/U ED visit

## 2021-12-09 NOTE — Telephone Encounter (Signed)
Patient sent to ed   patient Name: [facility] A[facility]WE[facility] Gender: Female DOB: [birth year] Age: 31 Y 2 M 30 D Return Phone Number: Address: [place] [address] Client [facility] [place] [facility] [place] [facility] Provider Dimas Chyle- MD Contact Type Call Who Is Calling Patient / Member / Family / Caregiver Call Type Triage / Clinical Relationship To Patient Self Return Phone Number Please choose phone number Chief Complaint HEAD INJURY - and not acting right. Change in behaviour after hitting head. Reason for Call Symptomatic / Request for [place] states they had dizzy spell last night, fell, hit head and lost consciousness . She hit head again and bleeding from [place] ED Translation No Nurse Assessment Nurse: D'Heur Lucia Gaskins, RN, Adrienne Date/Time (Eastern Time): 12/09/2021 8:20:43 AM Confirm and document reason for call. If symptomatic, describe symptoms. ---Caller states they had dizzy spell last night after drinking Dr. Malachi Bonds, developed pain in her chest, fell, hit her head and lost consciousness. She passed out again a few minutes later, hit head again and bleeding from nose. Her nose is swollen with broken skin. Her head hurts a lot-pain is moderate. No current bleeding. Does the patient have any new or worsening symptoms? ---Yes Will a triage be completed? ---Yes Related visit to physician within the last 2 weeks? ---No Does the PT have any chronic conditions? (i.e. diabetes, asthma, this includes High risk factors for pregnancy, etc.) ---Yes List chronic conditions. ---depression Is the patient pregnant or possibly pregnant? (Ask all females between the ages of 52-55) ---No Is this a behavioral health or substance abuse call? ---No PLEASE NOTE: All timestamps contained within this report are represented as [place]  Standard Time. CONFIDENTIALTY NOTICE: This fax transmission is intended only for the addressee. It contains information that is legally privileged, confidential or otherwise protected from use or disclosure. If you are not the intended recipient, you are strictly prohibited from reviewing, disclosing, copying using or disseminating any of this information or taking any action in reliance on or regarding this information. If you have received this fax in error, please notify us immediately by telephone so that we can arrange for its return to [place]. Phone: [phone], Toll-Free: [phone], Fax: [phone] Page: 2 of 2 Call Id: [id number] Guidelines Guideline Title Affirmed Question Affirmed Notes Nurse Date/Time Eilene Ghazi Time) Head Injury Can't remember what happened (amnesia) D'Heur Lucia Gaskins, RN, Adrienne 12/09/2021 8:26:40 AM Disp. Time Eilene Ghazi Time) Disposition Final User 12/09/2021 8:16:41 AM Send to Urgent Queue Mable Paris 12/09/2021 8:30:15 AM Go to ED Now Yes D'Heur Lucia Gaskins, RN, Adrienne Final Disposition 12/09/2021 8:30:15 AM Go to ED Now Yes D'Heur Lucia Gaskins, RN, Ebbie Latus Disagree/Comply Comply Caller Understands Yes PreDisposition Call Doctor Care Advice Given Per Guideline GO TO ED NOW: * Leave now. Drive carefully. ANOTHER ADULT SHOULD DRIVE: NOTHING BY MOUTH: * Do not allow any eating or drinking. CARE ADVICE given per Head Injury (Adult) guideline. Comments User: Mable Paris Date/Time Eilene Ghazi Time): 12/09/2021 8:15:57 AM Caller was transferred from office, was able to get zip from caller but they stopped responding when attempting to get call back number. User: Vincente Liberty, D'Heur Lucia Gaskins, RN Date/Time Eilene Ghazi Time): 12/09/2021 8:26:16 AM Caller states she did not take her Lexapro for three days (this past Saturday and Sunday) Referrals GO TO FACILITY OTHER - SPECIFY

## 2021-12-09 NOTE — Discharge Instructions (Addendum)
Your CT scan today shows that you have a broken nose.  Apply ice packs on and off to help with swelling.  Avoid heavy lifting straining or blowing your nose.  Please contact the ENT provider listed to arrange a follow-up appointment.  Also, someone from cardiology will likely contact you to arrange follow-up appointment regarding your syncope.  Please follow-up with your primary care doctor as well.  Return the emergency department for any new or worsening symptoms.

## 2021-12-09 NOTE — ED Triage Notes (Signed)
Patient states last night she had episode of CP, patient passed out, spouse states he heard her thump on the ground, pt hit head and has abrasion on nose. While still sitting on floor about 4 min after the first syncope episode she passed out again and banged back of head on dresser.  Patient states when she was younger she had fainting spells.

## 2021-12-09 NOTE — Telephone Encounter (Signed)
Patient had a dizzy spell last night, lost consciousness fell and hit head.   At 3am Stated she told her husband she needed to lay back down and he said no she did not, and patient fell again hitting head- had nose bleeds.   Patient sounds very nervous over the phone -   Patient has been triage for further review awaiting notes -

## 2021-12-09 NOTE — ED Provider Notes (Signed)
[facility] EMERGENCY DEPARTMENT Provider Note   CSN: [id number] Arrival date & time: 12/09/21  4235     History  Chief Complaint  Patient presents with   Loss of Consciousness    Tammy Knight is a 31 y.o. female.   Loss of Consciousness Associated symptoms: chest pain, headaches and nausea   Associated symptoms: no dizziness, no fever, no seizures, no shortness of breath, no vomiting and no weakness         Tammy Knight is a 31 y.o. female with past medical history of depression and anxiety disorder who presents to the Emergency Department complaining of syncopal episode x2 last evening.  Patient states that last evening she woke from sleep to check on her child and use the restroom, before going back to bed she had a sudden sharp pain to her mid chest that caused her to double over.  Patient spouse states that he heard a loud thump and noticed that she was lying on the floor mumbling.  She quickly awakened and was speaking to him she struck her nose on the floor and was bleeding from both nostrils and front of her nose he went to get a cloth to wipe her face and heard another thump and she passed out again striking the back of her head on a dresser.  Patient spouse states that was approximately 3 to 4 minutes between episodes.  States that she was snoring after the second episode, but quickly woke up again.  States that she was alert and speaking in full sentences after the second episode.  No witnessed jerking or seizure-like activity.  Patient does endorse that she has been feeling tired recently and abruptly stopped her 20 mg Lexapro and also stopped her birth control pills 1 month ago.  Today, she complains of a frontal to left-sided headache.  No further chest pain or shortness of breath.  Had nausea last evening but has since resolved.  No vomiting, visual changes or recent illness.  She donated plasma 2 days ago and does this frequently.    Home  Medications Prior to Admission medications   Medication Sig Start Date End Date Taking? Authorizing Provider  escitalopram (LEXAPRO) 20 MG tablet TAKE 1 TABLET BY MOUTH EVERY DAY 07/30/21   Vivi Barrack, MD  sulfamethoxazole-trimethoprim (BACTRIM DS) 800-160 MG tablet Take 1 tablet by mouth 2 (two) times daily after a meal. 08/11/21   Hudnell, Colletta [place], NP  SYEDA 3-0.03 MG tablet Take 1 tablet by mouth daily. 06/02/21   [provider]      Allergies    Penicillins and Bee venom    Review of Systems   Review of Systems  Constitutional:  Positive for fatigue. Negative for appetite change, chills and fever.  HENT:  Positive for nosebleeds. Negative for trouble swallowing.        Pain and swelling of her nose  Eyes:  Negative for pain and visual disturbance.  Respiratory:  Negative for cough and shortness of breath.   Cardiovascular:  Positive for chest pain and syncope.  Gastrointestinal:  Positive for nausea. Negative for abdominal pain, diarrhea and vomiting.  Genitourinary:  Negative for decreased urine volume, difficulty urinating and dysuria.  Musculoskeletal:  Positive for neck pain. Negative for arthralgias and neck stiffness.  Skin:  Negative for rash.  Neurological:  Positive for syncope and headaches. Negative for dizziness, seizures, speech difficulty, weakness and numbness.    Physical Exam Updated Vital Signs BP (!) 96/54  Pulse 69   Temp 98.9 F (37.2 C) (Oral)   Resp 18   Ht '5\' 5"'$  (1.651 m)   Wt 112.5 kg   LMP 12/01/2021   SpO2 100%   BMI 41.27 kg/m  Physical Exam Vitals and nursing note reviewed.  Constitutional:      General: She is not in acute distress.    Appearance: Normal appearance. She is not ill-appearing or toxic-appearing.  HENT:     Head:     Comments: Small abrasion to the left temple, no hematomas of the scalp    Right Ear: Tympanic membrane and ear canal normal. No hemotympanum.     Left Ear: Tympanic membrane and ear canal  normal. No hemotympanum.     Nose: Signs of injury present. No nasal tenderness.     Right Nostril: No septal hematoma.     Left Nostril: No septal hematoma.     Right Turbinates: Enlarged.     Left Turbinates: Enlarged.      Comments: Tenderness at mid nose with abrasion present.  No epistaxis.  There is dried blood to both nares. Mild edema noted    Mouth/Throat:     Mouth: Mucous membranes are moist.     Pharynx: Oropharynx is clear.  Eyes:     Extraocular Movements: Extraocular movements intact.     Conjunctiva/sclera: Conjunctivae normal.     Pupils: Pupils are equal, round, and reactive to light.  Neck:     Comments: Tenderness of the mid cervical spine and left cervical paraspinal muscles.  No bony step-offs. Cardiovascular:     Rate and Rhythm: Normal rate and regular rhythm.     Pulses: Normal pulses.  Pulmonary:     Effort: Pulmonary effort is normal. No respiratory distress.     Breath sounds: Normal breath sounds.  Chest:     Chest wall: No tenderness.  Abdominal:     General: There is no distension.     Palpations: Abdomen is soft.     Tenderness: There is no abdominal tenderness.  Musculoskeletal:        General: No swelling.     Cervical back: Tenderness present.     Right lower leg: No edema.     Left lower leg: No edema.  Skin:    General: Skin is warm.     Capillary Refill: Capillary refill takes less than 2 seconds.  Neurological:     General: No focal deficit present.     Mental Status: She is alert.     GCS: GCS eye subscore is 4. GCS verbal subscore is 5. GCS motor subscore is 6.     Sensory: Sensation is intact. No sensory deficit.     Motor: Motor function is intact. No weakness.     Coordination: Coordination is intact.     Comments: CN II through XII intact.  Speech clear.  No pronator drift.  5 out of 5 motor strength of the bilateral upper and lower extremities     ED Results / Procedures / Treatments   Labs (all labs ordered are listed,  but only abnormal results are displayed) Labs Reviewed  BASIC METABOLIC PANEL - Abnormal; Notable for the following components:      Result Value   Calcium 8.2 (*)    Anion gap 4 (*)    All other components within normal limits  URINALYSIS, ROUTINE W REFLEX MICROSCOPIC - Abnormal; Notable for the following components:   Color, Urine STRAW (*)  All other components within normal limits  CBG MONITORING, ED - Abnormal; Notable for the following components:   Glucose-Capillary 64 (*)    All other components within normal limits  CBC WITH DIFFERENTIAL/PLATELET  TSH  POC URINE PREG, ED  TROPONIN I (HIGH SENSITIVITY)    EKG EKG Interpretation  Date/Time:  Tuesday December 09 2021 09:26:56 EDT Ventricular Rate:  78 PR Interval:  177 QRS Duration: 79 QT Interval:  376 QTC Calculation: 429 R Axis:   20 Text Interpretation: Normal sinus rhythm Confirmed by Margaretmary Eddy [phone]) on 12/09/2021 10:12:51 AM  Radiology CT HEAD WO CONTRAST  Result Date: 12/09/2021 CLINICAL DATA:  Fall after loss of consciousness. EXAM: CT HEAD WITHOUT CONTRAST CT MAXILLOFACIAL WITHOUT CONTRAST CT CERVICAL SPINE WITHOUT CONTRAST TECHNIQUE: Multidetector CT imaging of the head, cervical spine, and maxillofacial structures were performed using the standard protocol without intravenous contrast. Multiplanar CT image reconstructions of the cervical spine and maxillofacial structures were also generated. RADIATION DOSE REDUCTION: This exam was performed according to the departmental dose-optimization program which includes automated exposure control, adjustment of the mA and/or kV according to patient size and/or use of iterative reconstruction technique. COMPARISON:  March 29, 2007. FINDINGS: CT HEAD FINDINGS Brain: No evidence of acute infarction, hemorrhage, hydrocephalus, extra-axial collection or mass lesion/mass effect. Vascular: No hyperdense vessel or unexpected calcification. Skull: Normal. Negative for  fracture or focal lesion. Other: None. CT MAXILLOFACIAL FINDINGS Osseous: The mandible is unremarkable. Mildly displaced nasal bone fracture is noted. Orbits: Negative. No traumatic or inflammatory finding. Sinuses: Clear. Soft tissues: Negative. CT CERVICAL SPINE FINDINGS Alignment: Normal. Skull base and vertebrae: No acute fracture. No primary bone lesion or focal pathologic process. Soft tissues and spinal canal: No prevertebral fluid or swelling. No visible canal hematoma. Disc levels:  Normal. Upper chest: Negative. Other: None. IMPRESSION: No acute intracranial abnormality seen. Mildly displaced nasal bone fracture is noted. No other abnormality seen in the maxillofacial region. No definite abnormality seen in the cervical spine. Electronically Signed   By: Marijo Conception M.D.   On: 12/09/2021 11:01   CT CERVICAL SPINE WO CONTRAST  Result Date: 12/09/2021 CLINICAL DATA:  Fall after loss of consciousness. EXAM: CT HEAD WITHOUT CONTRAST CT MAXILLOFACIAL WITHOUT CONTRAST CT CERVICAL SPINE WITHOUT CONTRAST TECHNIQUE: Multidetector CT imaging of the head, cervical spine, and maxillofacial structures were performed using the standard protocol without intravenous contrast. Multiplanar CT image reconstructions of the cervical spine and maxillofacial structures were also generated. RADIATION DOSE REDUCTION: This exam was performed according to the departmental dose-optimization program which includes automated exposure control, adjustment of the mA and/or kV according to patient size and/or use of iterative reconstruction technique. COMPARISON:  March 29, 2007. FINDINGS: CT HEAD FINDINGS Brain: No evidence of acute infarction, hemorrhage, hydrocephalus, extra-axial collection or mass lesion/mass effect. Vascular: No hyperdense vessel or unexpected calcification. Skull: Normal. Negative for fracture or focal lesion. Other: None. CT MAXILLOFACIAL FINDINGS Osseous: The mandible is unremarkable. Mildly displaced  nasal bone fracture is noted. Orbits: Negative. No traumatic or inflammatory finding. Sinuses: Clear. Soft tissues: Negative. CT CERVICAL SPINE FINDINGS Alignment: Normal. Skull base and vertebrae: No acute fracture. No primary bone lesion or focal pathologic process. Soft tissues and spinal canal: No prevertebral fluid or swelling. No visible canal hematoma. Disc levels:  Normal. Upper chest: Negative. Other: None. IMPRESSION: No acute intracranial abnormality seen. Mildly displaced nasal bone fracture is noted. No other abnormality seen in the maxillofacial region. No definite abnormality seen in the cervical spine. Electronically  Signed   By: Marijo Conception M.D.   On: 12/09/2021 11:01   CT Maxillofacial Wo Contrast  Result Date: 12/09/2021 CLINICAL DATA:  Fall after loss of consciousness. EXAM: CT HEAD WITHOUT CONTRAST CT MAXILLOFACIAL WITHOUT CONTRAST CT CERVICAL SPINE WITHOUT CONTRAST TECHNIQUE: Multidetector CT imaging of the head, cervical spine, and maxillofacial structures were performed using the standard protocol without intravenous contrast. Multiplanar CT image reconstructions of the cervical spine and maxillofacial structures were also generated. RADIATION DOSE REDUCTION: This exam was performed according to the departmental dose-optimization program which includes automated exposure control, adjustment of the mA and/or kV according to patient size and/or use of iterative reconstruction technique. COMPARISON:  March 29, 2007. FINDINGS: CT HEAD FINDINGS Brain: No evidence of acute infarction, hemorrhage, hydrocephalus, extra-axial collection or mass lesion/mass effect. Vascular: No hyperdense vessel or unexpected calcification. Skull: Normal. Negative for fracture or focal lesion. Other: None. CT MAXILLOFACIAL FINDINGS Osseous: The mandible is unremarkable. Mildly displaced nasal bone fracture is noted. Orbits: Negative. No traumatic or inflammatory finding. Sinuses: Clear. Soft tissues:  Negative. CT CERVICAL SPINE FINDINGS Alignment: Normal. Skull base and vertebrae: No acute fracture. No primary bone lesion or focal pathologic process. Soft tissues and spinal canal: No prevertebral fluid or swelling. No visible canal hematoma. Disc levels:  Normal. Upper chest: Negative. Other: None. IMPRESSION: No acute intracranial abnormality seen. Mildly displaced nasal bone fracture is noted. No other abnormality seen in the maxillofacial region. No definite abnormality seen in the cervical spine. Electronically Signed   By: Marijo Conception M.D.   On: 12/09/2021 11:01   DG Chest 2 View  Result Date: 12/09/2021 CLINICAL DATA:  Pt states chest pain last night that caused her to pass out/hx asthma/ex smoker EXAM: CHEST - 2 VIEW COMPARISON:  Chest radiograph 07/22/2016 FINDINGS: The cardiomediastinal contours are within normal limits. The lungs are clear. No pneumothorax or pleural effusion. No acute finding in the visualized skeleton. IMPRESSION: No active cardiopulmonary disease. Electronically Signed   By: Audie Pinto M.D.   On: 12/09/2021 10:44     Procedures Procedures    Medications Ordered in ED Medications - No data to display  ED Course/ Medical Decision Making/ A&P                           Medical Decision Making Patient here for evaluation of chest pain and syncopal episode x2 last evening.  No witnessed seizure-like activity.  Duration of both episodes described as brief per patient's spouse.  Patient complaining of fatigue for several days and headache this morning.  She does endorse abruptly stopping her Lexapro and birth control pills.  On exam, patient well-appearing nontoxic.  I do not appreciate any focal neurodeficits.  She does have a frontal to left-sided headache and there is a abrasion of her left temple area.  She has tenderness and edema to the bridge of her nose.  I suspect she has a nasal bone fracture.  Differential would include but not limited to subdural  hematoma, brain mass.  Amount and/or Complexity of Data Reviewed Labs: ordered.    Details: Labs interpreted by me, values essentially unremarkable.  CBG upon arrival was noted to be 64.  Pt had not eaten or drank fluids prior to arrival.  Was given OJ and crackers.  BS 97 on BMP Radiology: ordered.    Details: CT head, C spine and maxillofacial  shows minimally displaced nasal bone ffx.  CXR w/o acute  CP process ECG/medicine tests: ordered.    Details: EKG shows NSR Discussion of management or test interpretation with external provider(s): Pt here with reassuring work up.  No findings to suggest acute intracranial injury or ACS.  Possible syncopal episodes related to arrhthymias.  Pt ambulatory here, reports feeling better after tylenol and IVF's.  Discussed need for cards f/u for likely out patient Holter monitoring.  Given ENT info for f/u regarding nasal bone fx.  She is agreeable to plan.  Ambulatory referral sent.  Pt given return precautions  Risk OTC drugs.           Final Clinical Impression(s) / ED Diagnoses Final diagnoses:  Syncope, unspecified syncope type  Closed fracture of nasal bone, initial encounter    Rx / DC Orders ED Discharge Orders     None         Bufford Lope 12/11/21 2212    Fransico Meadow, MD 12/13/21 0700

## 2021-12-10 NOTE — Telephone Encounter (Signed)
See note, Patient has OV on 12/11/2021

## 2021-12-11 ENCOUNTER — Encounter: Payer: Self-pay | Admitting: Family Medicine

## 2021-12-11 ENCOUNTER — Ambulatory Visit (INDEPENDENT_AMBULATORY_CARE_PROVIDER_SITE_OTHER): Payer: BC Managed Care – PPO | Admitting: Family Medicine

## 2021-12-11 VITALS — BP 107/72 | HR 78 | Temp 98.4°F | Ht 65.0 in | Wt 251.0 lb

## 2021-12-11 DIAGNOSIS — R55 Syncope and collapse: Secondary | ICD-10-CM

## 2021-12-11 DIAGNOSIS — R1310 Dysphagia, unspecified: Secondary | ICD-10-CM

## 2021-12-11 DIAGNOSIS — F324 Major depressive disorder, single episode, in partial remission: Secondary | ICD-10-CM

## 2021-12-11 DIAGNOSIS — E669 Obesity, unspecified: Secondary | ICD-10-CM

## 2021-12-11 NOTE — Assessment & Plan Note (Addendum)
Continue lifestyle modifications.  She is working with Physiological scientist

## 2021-12-11 NOTE — Progress Notes (Signed)
Cardiology Office Note   Date:  12/12/2021   ID:  Aashka, Salomone 1990-07-14, MRN [id number]  PCP:  Vivi Barrack, MD  Cardiologist:   None Referring:  Vivi Barrack, MD   Chief Complaint  Patient presents with   Loss of Consciousness     History of Present Illness: Tammy Knight is a 31 y.o. female who presents for evaluation of syncope.  She has no past cardiac history.  She had a syncopal episode and I reviewed this.  This was on 822.  She actually got up in the night to change her child's diaper.  She sat down and took a sip of Dr. Malachi Bonds and had severe epigastric discomfort.  She then lost consciousness injuring her nose.  Her husband sat her up on the floor and she fell over again hitting her head.  She went to the ER.  Labs were unremarkable.  She had a head CT which was unremarkable.  Cardiac work-up including enzymes and EKG were unremarkable.  She had a history of passing out when she was in high school and it was never clear.  She was told she probably was orthostatic.  She had not passed out in 10 years however.  She had otherwise been feeling well.  She is under the stress of raising a child.  She donates plasma frequently.  She had not eaten very much.  She was fatigued.  She is going probably to see a gastroenterologist.  Apparently she is also going to see ENT.  She does occasionally feel some palpitations but she did not that night.  She did not have any chest pressure, neck or arm discomfort.  She had the sharp discomfort in her mid epigastric area as described.   Past Medical History:  Diagnosis Date   Asthma    h/o no inhalers   Chlamydia    Depression    Hay fever    UTI (urinary tract infection)     Past Surgical History:  Procedure Laterality Date   ABDOMINOPLASTY  2014   CESAREAN SECTION N/A 11/24/2019   Procedure: CESAREAN SECTION;  Surgeon: Everlene Farrier, MD;  Location: [facility] LD ORS;  Service: Obstetrics;  Laterality: N/A;    LAPAROSCOPIC SALPINGO OOPHERECTOMY Right 06/13/2018   Procedure: LAPAROSCOPIC RIGHT  OOPHORECTOMY;  Surgeon: Harlin Heys, MD;  Location: [facility] ORS;  Service: Gynecology;  Laterality: Right;   TONSILLECTOMY       Current Outpatient Medications  Medication Sig Dispense Refill   escitalopram (LEXAPRO) 20 MG tablet TAKE 1 TABLET BY MOUTH EVERY DAY (Patient taking differently: Take 20 mg by mouth daily.) 90 tablet 1   No current facility-administered medications for this visit.    Allergies:   Penicillins and Bee venom    Social History:  The patient  reports that she quit smoking about 3 years ago. Her smoking use included cigarettes. She has never used smokeless tobacco. She reports that she does not currently use alcohol after a past usage of about 5.0 standard drinks of alcohol per week. She reports that she does not use drugs.   Family History:  The patient's family history includes Alcohol abuse in her father; Arthritis in her mother; Birth defects in her sister; Breast cancer in her maternal aunt; COPD in her mother; Depression in her father, mother, and sister; Diabetes in her paternal grandfather; Glaucoma in her mother; Hearing loss in her sister; Heart attack (age of onset: 74) in her father; Hyperlipidemia  in her father; Hypertension in her father.    ROS:  Please see the history of present illness.   Otherwise, review of systems are positive for none.   All other systems are reviewed and negative.    PHYSICAL EXAM: VS:  BP 121/78   Pulse 70   Ht '5\' 5"'$  (1.651 m)   Wt 250 lb (113.4 kg)   LMP 12/01/2021   SpO2 97%   BMI 41.60 kg/m  , BMI Body mass index is 41.6 kg/m. GENERAL:  Well appearing HEENT:  Pupils equal round and reactive, fundi not visualized, oral mucosa unremarkable NECK:  No jugular venous distention, waveform within normal limits, carotid upstroke brisk and symmetric, no bruits, no thyromegaly LYMPHATICS:  No cervical, inguinal adenopathy LUNGS:  Clear to  auscultation bilaterally BACK:  No CVA tenderness CHEST:  Unremarkable HEART:  PMI not displaced or sustained,S1 and S2 within normal limits, no S3, no S4, no clicks, no rubs, no murmurs ABD:  Flat, positive bowel sounds normal in frequency in pitch, no bruits, no rebound, no guarding, no midline pulsatile mass, no hepatomegaly, no splenomegaly EXT:  2 plus pulses throughout, no edema, no cyanosis no clubbing SKIN:  No rashes no nodules NEURO:  Cranial nerves II through XII grossly intact, motor grossly intact throughout PSYCH:  Cognitively intact, oriented to person place and time    EKG:  EKG is ordered today. The ekg ordered today demonstrates sinus rhythm, rate 70, axis within normal limits, intervals within normal limits, no acute ST-T wave changes.   Recent Labs: 12/09/2021: BUN 9; Creatinine, Ser 0.58; Hemoglobin 13.2; Platelets 258; Potassium 3.8; Sodium 138; TSH 1.401    Lipid Panel    Component Value Date/Time   CHOL 155 07/05/2020 1448   TRIG 40 07/05/2020 1448   HDL 58 07/05/2020 1448   CHOLHDL 2.7 07/05/2020 1448   VLDL 7.8 08/13/2017 1412   LDLCALC 86 07/05/2020 1448      Wt Readings from Last 3 Encounters:  12/12/21 250 lb (113.4 kg)  12/11/21 251 lb (113.9 kg)  12/09/21 248 lb (112.5 kg)      Other studies Reviewed: Additional studies/ records that were reviewed today include: ED records. Review of the above records demonstrates:  Please see elsewhere in the note.     ASSESSMENT AND PLAN:  Syncope:   This was most likely vagal.  However, I am going to have her wear a 2-week monitor.  She was not orthostatic in the office.  Labs were unremarkable.  If the monitor is unremarkable I would not suggest further cardiac work-up.     Current medicines are reviewed at length with the patient today.  The patient does not have concerns regarding medicines.  The following changes have been made:  no change  Labs/ tests ordered today include:   Orders Placed  This Encounter  Procedures   LONG TERM MONITOR (3-14 DAYS)   EKG 12-Lead     Disposition:   FU with me as needed   Signed, Minus Breeding, MD  12/12/2021 2:05 PM    [place]

## 2021-12-11 NOTE — Patient Instructions (Signed)
It was very nice to see you today!  I will refer you to see a gastroenterologist.  Please follow-up with the cardiologist tomorrow.  Take care, Dr [name]  PLEASE NOTE:  If you had any lab tests please let us know if you have not heard back within a few days. You may see your results on mychart before we have a chance to review them but we will give you a call once they are reviewed by [place]. If we ordered any referrals today, please let us know if you have not heard from their office within the next week.   Please try these tips to maintain a healthy lifestyle:  Eat at least 3 REAL meals and 1-2 snacks per day.  Aim for no more than 5 hours between eating.  If you eat breakfast, please do so within one hour of getting up.   Each meal should contain half fruits/vegetables, one quarter protein, and one quarter carbs (no bigger than a computer mouse)  Cut down on sweet beverages. This includes juice, soda, and sweet tea.   Drink at least 1 glass of water with each meal and aim for at least 8 glasses per day  Exercise at least 150 minutes every week.

## 2021-12-11 NOTE — Assessment & Plan Note (Signed)
She is no longer sure that Lexapro is helping.  Had lengthy discussion with patient regarding her medications.  We will continue for now though at some point she would like to come off Lexapro to see how she does.  I think this would be reasonable though instructed patient to not stop cold [place] but wean off over the course of a few weeks.  She will send me a message via MyChart if she would like to do this.  May consider addition of Wellbutrin at some point in the future.

## 2021-12-11 NOTE — Progress Notes (Signed)
Tammy Knight is a 31 y.o. female who presents today for an office visit.  Assessment/Plan:  New/Acute Problems: Syncope History seems consistent with a vasovagal episode.  Sounds like she is having some dysphagia and may have had an esophageal spasm that precipitated the event.  She will be following up with cardiology tomorrow for further cardiac evaluation.  Dysphagia  She does have underlying GERD.  Given her progressive symptoms including dysphagia and regurgitation we will place referral to GI for further evaluation.  Nasal fracture Pain is manageable.  Able to breathe freely.  Was found to be very mildly displaced on her CT scan.  We discussed referral to ENT however she will defer for now.  I think this is reasonable given her only minimal displacement and adequate pain control though we did discuss cosmetic outcome. She will let me know if she change her mind in the future.  Chronic Problems Addressed Today: Depression, major, single episode, in partial remission ([place]) She is no longer sure that Lexapro is helping.  Had lengthy discussion with patient regarding her medications.  We will continue for now though at some point she would like to come off Lexapro to see how she does.  I think this would be reasonable though instructed patient to not stop cold [place] but wean off over the course of a few weeks.  She will send me a message via MyChart if she would like to do this.  May consider addition of Wellbutrin at some point in the future.  Obesity Continue lifestyle modifications.  She is working with Physiological scientist     Subjective:  HPI:  Patient here for ED follow-up.  Went to ED on 12/09/2021 with 2 syncopal episodes.  This happened about 3 to 4 minutes apart.  In the ED had imaging which was reassuring.  Labs were negative. She was discharged home. She has done well since being home.   Patient does note that just prior to her first syncopal episode she had  drank a bottle of soda and noticed a sharp pain in the middle of her chest. The next thing she remembers is waking up on floor covered in blood.  She has noticed pain in her chest with swallowing and feeling like things are getting stuck in her throat.  She is not sure how long this has been going on.  Sometimes food will come back up.  No intentional weight loss.  No early satiety.       Objective:  Physical Exam: BP 107/72   Pulse 78   Temp 98.4 F (36.9 C) (Temporal)   Ht '5\' 5"'$  (1.651 m)   Wt 251 lb (113.9 kg)   LMP 12/01/2021   SpO2 97%   BMI 41.77 kg/m   Wt Readings from Last 3 Encounters:  12/11/21 251 lb (113.9 kg)  12/09/21 248 lb (112.5 kg)  08/11/21 246 lb 8 oz (111.8 kg)    Gen: No acute distress, resting comfortably CV: Regular rate and rhythm with no murmurs appreciated Pulm: Normal work of breathing, clear to auscultation bilaterally with no crackles, wheezes, or rhonchi Neuro: Grossly normal, moves all extremities Psych: Normal affect and thought content  Time Spent: 40 minutes of total time was spent on the date of the encounter performing the following actions: chart review prior to seeing the patient including recent ED visit, obtaining history, performing a medically necessary exam, counseling on the treatment plan, placing orders, and documenting in our EHR.  Algis Greenhouse. [name], MD 12/11/2021 3:59 PM

## 2021-12-12 ENCOUNTER — Ambulatory Visit (INDEPENDENT_AMBULATORY_CARE_PROVIDER_SITE_OTHER): Payer: BC Managed Care – PPO | Admitting: Cardiology

## 2021-12-12 ENCOUNTER — Encounter: Payer: Self-pay | Admitting: Cardiology

## 2021-12-12 ENCOUNTER — Ambulatory Visit (INDEPENDENT_AMBULATORY_CARE_PROVIDER_SITE_OTHER): Payer: BC Managed Care – PPO

## 2021-12-12 VITALS — BP 121/78 | HR 70 | Ht 65.0 in | Wt 250.0 lb

## 2021-12-12 DIAGNOSIS — R55 Syncope and collapse: Secondary | ICD-10-CM

## 2021-12-12 NOTE — Progress Notes (Unsigned)
Enrolled for [employer] to mail a ZIO XT long term holter monitor to the patients address on file.  

## 2021-12-12 NOTE — Patient Instructions (Signed)
Medication Instructions:  Your physician recommends that you continue on your current medications as directed. Please refer to the Current Medication list given to you today.  *If you need a refill on your cardiac medications before your next appointment, please call your pharmacy*   Testing/Procedures:  [place] Monitor Instructions  Your physician has requested you wear a ZIO patch monitor for 14 days.  This is a single patch monitor. [employer] supplies one patch monitor per enrollment. Additional stickers are not available. Please do not apply patch if you will be having a Nuclear Stress Test,  Echocardiogram, Cardiac CT, MRI, or Chest Xray during the period you would be wearing the  monitor. The patch cannot be worn during these tests. You cannot remove and re-apply the  ZIO XT patch monitor.  Your ZIO patch monitor will be mailed 3 day USPS to your address on file. It may take 3-5 days  to receive your monitor after you have been enrolled.  Once you have received your monitor, please review the enclosed instructions. Your monitor  has already been registered assigning a specific monitor serial # to you.  Billing and Patient Assistance Program Information  We have supplied [employer] with any of your insurance information on file for billing purposes. [employer] offers a sliding scale Patient Assistance Program for patients that do not have  insurance, or whose insurance does not completely cover the cost of the ZIO monitor.  You must apply for the Patient Assistance Program to qualify for this discounted rate.  To apply, please call [employer] at [phone], select option 4, select option 2, ask to apply for  Patient Assistance Program. Theodore Demark will ask your household income, and how many people  are in your household. They will quote your out-of-pocket cost based on that information.  [employer] will also be able to set up a 74-month interest-free payment plan if needed.  Applying  the monitor   Shave hair from upper left chest.  Hold abrader disc by orange tab. Rub abrader in 40 strokes over the upper left chest as  indicated in your monitor instructions.  Clean area with 4 enclosed alcohol pads. Let dry.  Apply patch as indicated in monitor instructions. Patch will be placed under collarbone on left  side of chest with arrow pointing upward.  Rub patch adhesive wings for 2 minutes. Remove white label marked "1". Remove the white  label marked "2". Rub patch adhesive wings for 2 additional minutes.  While looking in a mirror, press and release button in center of patch. A small green light will  flash 3-4 times. This will be your only indicator that the monitor has been turned on.  Do not shower for the first 24 hours. You may shower after the first 24 hours.  Press the button if you feel a symptom. You will hear a small click. Record Date, Time and  Symptom in the Patient Logbook.  When you are ready to remove the patch, follow instructions on the last 2 pages of Patient  Logbook. Stick patch monitor onto the last page of Patient Logbook.  Place Patient Logbook in the blue and white box. Use locking tab on box and tape box closed  securely. The blue and white box has prepaid postage on it. Please place it in the mailbox as  soon as possible. Your physician should have your test results approximately 7 days after the  monitor [facility]  Call [employer]  [facility] at [phone] if you have questions regarding  your ZIO XT patch monitor. Call them immediately if you see an orange light blinking on your  monitor.  If your monitor falls off in less than 4 days, contact our Monitor department at [phone].  If your monitor becomes loose or falls off after 4 days call [employer] at [phone] for  suggestions on securing your monitor    Follow-Up: At [facility], you and your health needs are our priority.  As part of our  continuing mission to provide you with exceptional heart care, we have created designated Provider Care Teams.  These Care Teams include your primary Cardiologist (physician) and Advanced Practice Providers (APPs -  Physician Assistants and Nurse Practitioners) who all work together to provide you with the care you need, when you need it.  We recommend signing up for the patient portal called "MyChart".  Sign up information is provided on this After Visit Summary.  MyChart is used to connect with patients for Virtual Visits (Telemedicine).  Patients are able to view lab/test results, encounter notes, upcoming appointments, etc.  Non-urgent messages can be sent to your provider as well.   To learn more about what you can do with MyChart, go to [email or web address].    Your next appointment:   We will see you on an as needed basis.  Provider:   Minus Breeding, MD

## 2021-12-22 DIAGNOSIS — R55 Syncope and collapse: Secondary | ICD-10-CM | POA: Diagnosis not present

## 2022-01-02 ENCOUNTER — Encounter: Payer: Self-pay | Admitting: Cardiology

## 2022-01-12 ENCOUNTER — Encounter: Payer: Self-pay | Admitting: *Deleted

## 2022-01-15 ENCOUNTER — Encounter: Payer: Self-pay | Admitting: Internal Medicine

## 2022-01-20 ENCOUNTER — Encounter: Payer: Self-pay | Admitting: *Deleted

## 2022-02-04 ENCOUNTER — Other Ambulatory Visit: Payer: Self-pay | Admitting: Family Medicine

## 2022-02-18 ENCOUNTER — Encounter: Payer: Self-pay | Admitting: Family Medicine

## 2022-02-19 ENCOUNTER — Encounter: Payer: Self-pay | Admitting: Internal Medicine

## 2022-02-23 ENCOUNTER — Ambulatory Visit (INDEPENDENT_AMBULATORY_CARE_PROVIDER_SITE_OTHER): Payer: BC Managed Care – PPO | Admitting: Internal Medicine

## 2022-02-23 ENCOUNTER — Encounter: Payer: Self-pay | Admitting: Internal Medicine

## 2022-02-23 VITALS — BP 102/86 | HR 70 | Ht 65.0 in | Wt 255.0 lb

## 2022-02-23 DIAGNOSIS — K5900 Constipation, unspecified: Secondary | ICD-10-CM | POA: Diagnosis not present

## 2022-02-23 DIAGNOSIS — K219 Gastro-esophageal reflux disease without esophagitis: Secondary | ICD-10-CM | POA: Diagnosis not present

## 2022-02-23 DIAGNOSIS — R1310 Dysphagia, unspecified: Secondary | ICD-10-CM | POA: Diagnosis not present

## 2022-02-23 NOTE — Patient Instructions (Signed)
_______________________________________________________  If you are age 30 or older, your body mass index should be between 23-30. Your Body mass index is 42.43 kg/m. If this is out of the aforementioned range listed, please consider follow up with your Primary Care Provider.  If you are age 3 or younger, your body mass index should be between 19-25. Your Body mass index is 42.43 kg/m. If this is out of the aformentioned range listed, please consider follow up with your Primary Care Provider.   ________________________________________________________  The [facility] providers would like to encourage you to use [facility] to communicate with providers for non-urgent requests or questions.  Due to long hold times on the telephone, sending your provider a message by [facility] may be a faster and more efficient way to get a response.  Please allow 48 business hours for a response.  Please remember that this is for non-urgent requests.  _______________________________________________________  Tammy Knight have been scheduled for an endoscopy. Please follow written instructions given to you at your visit today. If you use inhalers (even only as needed), please bring them with you on the day of your procedure.   Due to recent changes in healthcare laws, you may see the results of your imaging and laboratory studies on MyChart before your provider has had a chance to review them.  We understand that in some cases there may be results that are confusing or concerning to you. Not all laboratory results come back in the same time frame and the provider may be waiting for multiple results in order to interpret others.  Please give [place] 48 hours in order for your provider to thoroughly review all the results before contacting the office for clarification of your results.    Drink 8 cups of water a day and walk 30 minutes a day.  Please purchase the following medications over the counter and take as directed: Fiber  supplement such as Benefiber- use as directed daily  Thank you for entrusting me with your care and choosing [facility].  Dr Lorenso Courier

## 2022-02-23 NOTE — Progress Notes (Signed)
Chief Complaint: Dysphagia  HPI : 31 year old female with history of GERD, asthma, depression presents with dysphagia  Endorses dysphagia and GERD. Her GERD has been present for years. She has noticed that when she is eating, food will get stuck on occasion. Denies dysphagia to liquids. She does not take any medications for GERD. Endorses family history of GERD and IBS. Denies other family history of GI issues. Denies prior EGD. She has had some issues with hemorrhoids after she had her daughter. She had a pain in her stomach and diarrhea recently, which has gotten better. When she did have diarrhea, her hemorrhoids got worse. She could feel something in her anus. Denies rectal itching, rectal pain, or rectal bleeding. Denies use of blood thinners. Denies N&V. She wonders if she has IBS. She has occasional issues with constipation.  Wt Readings from Last 3 Encounters:  02/23/22 255 lb (115.7 kg)  12/12/21 250 lb (113.4 kg)  12/11/21 251 lb (113.9 kg)   Past Medical History:  Diagnosis Date   Asthma    h/o no inhalers   Chlamydia    Depression    Hay fever    UTI (urinary tract infection)      Past Surgical History:  Procedure Laterality Date   ABDOMINOPLASTY  2014   CESAREAN SECTION N/A 11/24/2019   Procedure: CESAREAN SECTION;  Surgeon: Everlene Farrier, MD;  Location: [place] LD ORS;  Service: Obstetrics;  Laterality: N/A;   LAPAROSCOPIC SALPINGO OOPHERECTOMY Right 06/13/2018   Procedure: LAPAROSCOPIC RIGHT  OOPHORECTOMY;  Surgeon: Harlin Heys, MD;  Location: [facility] ORS;  Service: Gynecology;  Laterality: Right;   TONSILLECTOMY     Family History  Problem Relation Age of Onset   COPD Mother    Glaucoma Mother    Arthritis Mother    Depression Mother    Heart attack Father 53       2014   Alcohol abuse Father    Depression Father    Hyperlipidemia Father    Hypertension Father    Depression Sister    Hearing loss Sister    Birth defects Sister        no thyroid    Diabetes Paternal Grandfather    Breast cancer Maternal Aunt    Ovarian cancer Neg Hx    Colon cancer Neg Hx    Social History   Tobacco Use   Smoking status: Former    Years: 5.00    Types: Cigarettes    Quit date: 01/07/2018    Years since quitting: 4.1   Smokeless tobacco: Never   Tobacco comments:    1-2 cig per day  Vaping Use   Vaping Use: Never used  Substance Use Topics   Alcohol use: Yes    Alcohol/week: 5.0 standard drinks of alcohol    Types: 5 Glasses of wine per week    Comment: 0- 2 drinks every other day   Drug use: No   Current Outpatient Medications  Medication Sig Dispense Refill   escitalopram (LEXAPRO) 20 MG tablet TAKE 1 TABLET BY MOUTH EVERY DAY 90 tablet 1   No current facility-administered medications for this visit.   Allergies  Allergen Reactions   Penicillins Anaphylaxis   Bee Venom Swelling   Review of Systems: All systems reviewed and negative except where noted in HPI.   Physical Exam: BP 102/86   Pulse 70   Ht '5\' 5"'$  (1.651 m)   Wt 255 lb (115.7 kg)   BMI 42.43 kg/m  Constitutional: Pleasant,well-developed, female in no acute distress. HEENT: Normocephalic and atraumatic. Conjunctivae are normal. No scleral icterus. Cardiovascular: Normal rate, regular rhythm.  Pulmonary/chest: Effort normal and breath sounds normal. No wheezing, rales or rhonchi. Abdominal: Soft, nondistended, nontender. Bowel sounds active throughout. There are no masses palpable. No hepatomegaly. Extremities: No edema Neurological: Alert and oriented to person place and time. Skin: Skin is warm and dry. No rashes noted. Psychiatric: Normal mood and affect. Behavior is normal.  Labs 11/2021: CBC nml. BMP unremarkable. TSH nml.   CT A/P w/contrast 05/14/18: IMPRESSION: 1. 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 Trace free fluid in the lower RIGHT paracolic gutter, versus extension component of the presumed dermoid. Recommend GYN consult for further workup considerations and/or possible surgical removal. If any localizable RIGHT lower quadrant pain, would consider pelvic ultrasound to exclude associated ovarian torsion. 2. Remainder of the abdomen and pelvis CT is unremarkable, as detailed above. No bowel obstruction or evidence of bowel wall inflammation seen. Moderate amount of stool throughout the nondistended colon. Appendix is normal.  ASSESSMENT AND PLAN: Dysphagia GERD Intermittent Constipation Patient presents with dysphagia and GERD. Dysphagia may be due to esophageal stricture from longstanding GERD or EoE (patient has history of asthma). Will proceed with EGD for further evaluation. Will also go over some conservative ways to help with GERD and constipation. - GERD handout - Drink 8 cups of water per day, walk 30 min, and daily fiber supplement - EGD [facility]  Christia Reading, MD  I spent 45 minutes of time, including in depth chart review, independent review of results as outlined above, communicating results with the patient directly, face-to-face time with the patient, coordinating care, ordering studies and medications as appropriate, and documentation.

## 2022-03-05 ENCOUNTER — Telehealth: Payer: Self-pay | Admitting: *Deleted

## 2022-03-05 NOTE — Patient Outreach (Signed)
  Care Coordination   03/05/2022 Name: Tammy Knight MRN: [id number] DOB: [birth year]   Care Coordination Outreach Attempts:  An unsuccessful telephone outreach was attempted today to offer the patient information about available care coordination services as a benefit of their health plan.   Follow Up Plan:  Additional outreach attempts will be made to offer the patient care coordination information and services.   Encounter Outcome:  No Answer  Care Coordination Interventions Activated:  No   Care Coordination Interventions:  No, not indicated    Raina Mina, RN Care Management Coordinator [place] Office [phone]

## 2022-03-18 ENCOUNTER — Ambulatory Visit (AMBULATORY_SURGERY_CENTER): Payer: BC Managed Care – PPO | Admitting: Internal Medicine

## 2022-03-18 ENCOUNTER — Encounter: Payer: Self-pay | Admitting: Internal Medicine

## 2022-03-18 VITALS — BP 108/75 | HR 47 | Temp 97.5°F | Resp 10 | Ht 65.0 in | Wt 255.0 lb

## 2022-03-18 DIAGNOSIS — K2289 Other specified disease of esophagus: Secondary | ICD-10-CM | POA: Diagnosis not present

## 2022-03-18 DIAGNOSIS — R1310 Dysphagia, unspecified: Secondary | ICD-10-CM

## 2022-03-18 DIAGNOSIS — K317 Polyp of stomach and duodenum: Secondary | ICD-10-CM | POA: Diagnosis not present

## 2022-03-18 DIAGNOSIS — K219 Gastro-esophageal reflux disease without esophagitis: Secondary | ICD-10-CM

## 2022-03-18 MED ORDER — SODIUM CHLORIDE 0.9 % IV SOLN: 500.0000 mL | Freq: Once | INTRAVENOUS | Status: DC

## 2022-03-18 MED ORDER — OMEPRAZOLE 40 MG PO CPDR: 40.0000 mg | DELAYED_RELEASE_CAPSULE | Freq: Every day | ORAL | 3 refills | Status: DC

## 2022-03-18 NOTE — Op Note (Signed)
[place] Patient Name: Tammy Knight Procedure Date: 03/18/2022 3:41 PM MRN: [id number] Endoscopist: Georgian Co , , [id number] Age: 31 Referring MD:  Date of Birth: 12-05-1990 Gender: Female Account #: [id number] Procedure:                Upper GI endoscopy Indications:              Dysphagia, Heartburn Medicines:                Monitored Anesthesia Care Procedure:                Pre-Anesthesia Assessment:                           - Prior to the procedure, a History and Physical                            was performed, and patient medications and                            allergies were reviewed. The patient's tolerance of                            previous anesthesia was also reviewed. The risks                            and benefits of the procedure and the sedation                            options and risks were discussed with the patient.                            All questions were answered, and informed consent                            was obtained. Prior Anticoagulants: The patient has                            taken no anticoagulant or antiplatelet agents. ASA                            Grade Assessment: II - A patient with mild systemic                            disease. After reviewing the risks and benefits,                            the patient was deemed in satisfactory condition to                            undergo the procedure.                           After obtaining informed consent, the endoscope was  passed under direct vision. Throughout the                            procedure, the patient's blood pressure, pulse, and                            oxygen saturations were monitored continuously. The                            Endoscope was introduced through the mouth, and                            advanced to the second part of duodenum. The upper                            GI endoscopy was  accomplished without difficulty.                            The patient tolerated the procedure well. Scope In: Scope Out: Findings:                 The examined esophagus was normal. This was                            biopsied with a cold forceps for histology.                           A single 8 mm sessile polyp with no bleeding and no                            stigmata of recent bleeding was found in the                            gastric body. The polyp was removed with a cold                            snare. Resection and retrieval were complete.                           The examined duodenum was normal. Complications:            No immediate complications. Estimated Blood Loss:     Estimated blood loss was minimal. Impression:               - Normal esophagus. Biopsied.                           - A single gastric polyp. Resected and retrieved.                           - Normal examined duodenum. Recommendation:           - Discharge patient to home (with escort).                           - Use  Prilosec (omeprazole) 40 mg PO daily for 8                            weeks.                           - Await pathology results.                           - Return to GI clinic in 6 weeks.                           - The findings and recommendations were discussed                            with the patient. Dr Georgian Co "Lyndee Leo" Lorenso Courier,  03/18/2022 4:00:10 PM

## 2022-03-18 NOTE — Progress Notes (Signed)
Pt's states no medical or surgical changes since previsit or office visit. 

## 2022-03-18 NOTE — Progress Notes (Signed)
Called to room to assist during endoscopic procedure.  Patient ID and intended procedure confirmed with present staff. Received instructions for my participation in the procedure from the performing physician.  

## 2022-03-18 NOTE — Progress Notes (Signed)
GASTROENTEROLOGY PROCEDURE H&P NOTE   Primary Care Physician: Vivi Barrack, MD    Reason for Procedure:   Dysphagia, GERD  Plan:    EGD  Patient is appropriate for endoscopic procedure(s) in the ambulatory ([place]) setting.  The nature of the procedure, as well as the risks, benefits, and alternatives were carefully and thoroughly reviewed with the patient. Ample time for discussion and questions allowed. The patient understood, was satisfied, and agreed to proceed.     HPI: Tammy Knight is a 31 y.o. female who presents for EGD for evaluation of dysphagia and GERD .  Patient was most recently seen in the Gastroenterology Clinic on 02/23/22.  No interval change in medical history since that appointment. Please refer to that note for full details regarding GI history and clinical presentation.   Past Medical History:  Diagnosis Date   Asthma    h/o no inhalers   Chlamydia    Depression    Hay fever    UTI (urinary tract infection)     Past Surgical History:  Procedure Laterality Date   ABDOMINOPLASTY  2014   CESAREAN SECTION N/A 11/24/2019   Procedure: CESAREAN SECTION;  Surgeon: Everlene Farrier, MD;  Location: [facility] LD ORS;  Service: Obstetrics;  Laterality: N/A;   LAPAROSCOPIC SALPINGO OOPHERECTOMY Right 06/13/2018   Procedure: LAPAROSCOPIC RIGHT  OOPHORECTOMY;  Surgeon: Harlin Heys, MD;  Location: [facility] ORS;  Service: Gynecology;  Laterality: Right;   TONSILLECTOMY      Prior to Admission medications   Medication Sig Start Date End Date Taking? Authorizing Provider  escitalopram (LEXAPRO) 20 MG tablet TAKE 1 TABLET BY MOUTH EVERY DAY 02/04/22   Vivi Barrack, MD    Current Outpatient Medications  Medication Sig Dispense Refill   escitalopram (LEXAPRO) 20 MG tablet TAKE 1 TABLET BY MOUTH EVERY DAY 90 tablet 1   Current Facility-Administered Medications  Medication Dose Route Frequency Provider Last Rate Last Admin   0.9 %  sodium chloride infusion   500 mL Intravenous Once Sharyn Creamer, MD        Allergies as of 03/18/2022 - Review Complete 03/18/2022  Allergen Reaction Noted   Penicillins Anaphylaxis 04/04/2011   Bee venom Swelling 08/21/2013    Family History  Problem Relation Age of Onset   COPD Mother    Glaucoma Mother    Arthritis Mother    Depression Mother    Heart attack Father 53       2014   Alcohol abuse Father    Depression Father    Hyperlipidemia Father    Hypertension Father    Depression Sister    Hearing loss Sister    Birth defects Sister        no thyroid   Diabetes Paternal Grandfather    Breast cancer Maternal Aunt    Ovarian cancer Neg Hx    Colon cancer Neg Hx     Social History   Socioeconomic History   Marital status: Married    Spouse name: Not on file   Number of children: 1   Years of education: Not on file   Highest education level: Not on file  Occupational History   Occupation: [employer]  Tobacco Use   Smoking status: Former    Years: 5.00    Types: Cigarettes    Quit date: 01/07/2018    Years since quitting: 4.1   Smokeless tobacco: Never   Tobacco comments:    1-2 cig per day  Vaping  Use   Vaping Use: Never used  Substance and Sexual Activity   Alcohol use: Yes    Alcohol/week: 5.0 standard drinks of alcohol    Types: 5 Glasses of wine per week    Comment: 0- 2 drinks every other day   Drug use: No   Sexual activity: Yes    Partners: Male    Birth control/protection: None  Other Topics Concern   Not on file  Social History Narrative   Not on file   Social Determinants of Health   Financial Resource Strain: Not on file  Food Insecurity: Not on file  Transportation Needs: Not on file  Physical Activity: Not on file  Stress: Not on file  Social Connections: Not on file  Intimate Partner Violence: Not on file    Physical Exam: Vital signs in last 24 hours: BP 109/72   Pulse (!) 57   Temp (!) 97.5 F (36.4 C)   Ht '5\' 5"'$  (1.651 m)   Wt 255 lb (115.7  kg)   SpO2 99%   BMI 42.43 kg/m  GEN: NAD EYE: Sclerae anicteric ENT: MMM CV: Non-tachycardic Pulm: No increased WOB GI: Soft NEURO:  Alert & Oriented   Christia Reading, MD [facility]   03/18/2022 3:30 PM

## 2022-03-18 NOTE — Progress Notes (Signed)
A and O x3. Report to RN. Tolerated MAC anesthesia well.Teeth unchanged after procedure. 

## 2022-03-18 NOTE — Patient Instructions (Addendum)
-   Discharge patient to home (with escort). - Use Prilosec (omeprazole) 40 mg PO daily for 8 weeks. - Await pathology results. - Return to GI clinic in 6 weeks.  Call office for appointment. - The findings and recommendations were discussed with the patient.  Handout on polyps given.  YOU HAD AN ENDOSCOPIC PROCEDURE TODAY AT [facility]:   Refer to the procedure report that was given to you for any specific questions about what was found during the examination.  If the procedure report does not answer your questions, please call your gastroenterologist to clarify.  If you requested that your care partner not be given the details of your procedure findings, then the procedure report has been included in a sealed envelope for you to review at your convenience later.  YOU SHOULD EXPECT: Some feelings of bloating in the abdomen. Passage of more gas than usual.  Walking can help get rid of the air that was put into your GI tract during the procedure and reduce the bloating. If you had a lower endoscopy (such as a colonoscopy or flexible sigmoidoscopy) you may notice spotting of blood in your stool or on the toilet paper. If you underwent a bowel prep for your procedure, you may not have a normal bowel movement for a few days.  Please Note:  You might notice some irritation and congestion in your nose or some drainage.  This is from the oxygen used during your procedure.  There is no need for concern and it should clear up in a day or so.  SYMPTOMS TO REPORT IMMEDIATELY  Following upper endoscopy (EGD)  Vomiting of blood or coffee ground material  New chest pain or pain under the shoulder blades  Painful or persistently difficult swallowing  New shortness of breath  Fever of 100F or higher  Black, tarry-looking stools  For urgent or emergent issues, a gastroenterologist can be reached at any hour by calling [phone]. Do not use MyChart messaging for urgent concerns.     DIET:  We do recommend a small meal at first, but then you may proceed to your regular diet.  Drink plenty of fluids but you should avoid alcoholic beverages for 24 hours.  ACTIVITY:  You should plan to take it easy for the rest of today and you should NOT DRIVE or use heavy machinery until tomorrow (because of the sedation medicines used during the test).    FOLLOW UP: Our staff will call the number listed on your records the next business day following your procedure.  We will call around 7:15- 8:00 am to check on you and address any questions or concerns that you may have regarding the information given to you following your procedure. If we do not reach you, we will leave a message.     If any biopsies were taken you will be contacted by phone or by letter within the next 1-3 weeks.  Please call us at [phone] if you have not heard about the biopsies in 3 weeks.    SIGNATURES/CONFIDENTIALITY: You and/or your care partner have signed paperwork which will be entered into your electronic medical record.  These signatures attest to the fact that that the information above on your After Visit Summary has been reviewed and is understood.  Full responsibility of the confidentiality of this discharge information lies with you and/or your care-partner.

## 2022-03-19 ENCOUNTER — Telehealth: Payer: Self-pay

## 2022-03-19 ENCOUNTER — Encounter: Payer: Self-pay | Admitting: Internal Medicine

## 2022-03-19 NOTE — Telephone Encounter (Signed)
Post procedure follow up call, no answer 

## 2022-03-25 ENCOUNTER — Encounter: Payer: Self-pay | Admitting: Internal Medicine

## 2022-04-02 ENCOUNTER — Ambulatory Visit (INDEPENDENT_AMBULATORY_CARE_PROVIDER_SITE_OTHER): Payer: BC Managed Care – PPO | Admitting: Family Medicine

## 2022-04-02 ENCOUNTER — Encounter: Payer: Self-pay | Admitting: Family Medicine

## 2022-04-02 DIAGNOSIS — R41840 Attention and concentration deficit: Secondary | ICD-10-CM

## 2022-04-02 DIAGNOSIS — F909 Attention-deficit hyperactivity disorder, unspecified type: Secondary | ICD-10-CM | POA: Insufficient documentation

## 2022-04-02 MED ORDER — OZEMPIC (0.25 OR 0.5 MG/DOSE) 2 MG/3ML ~~LOC~~ SOPN: 0.2500 mg | PEN_INJECTOR | SUBCUTANEOUS | 1 refills | Status: DC

## 2022-04-02 NOTE — Patient Instructions (Addendum)
It was very nice to see you today!  We will start ozempic. Take 0.'25mg'$  weekly for 4 weeks then increase to 0.'5mg'$  weekly. Please send me a message in a few weeks limit how this is working for you.  I will refer you to psychology for ADHD evaluation.  Take care, Dr [name]  PLEASE NOTE:  If you had any lab tests please let us know if you have not heard back within a few days. You may see your results on mychart before we have a chance to review them but we will give you a call once they are reviewed by [place]. If we ordered any referrals today, please let us know if you have not heard from their office within the next week.   Please try these tips to maintain a healthy lifestyle:  Eat at least 3 REAL meals and 1-2 snacks per day.  Aim for no more than 5 hours between eating.  If you eat breakfast, please do so within one hour of getting up.   Each meal should contain half fruits/vegetables, one quarter protein, and one quarter carbs (no bigger than a computer mouse)  Cut down on sweet beverages. This includes juice, soda, and sweet tea.   Drink at least 1 glass of water with each meal and aim for at least 8 glasses per day  Exercise at least 150 minutes every week.

## 2022-04-02 NOTE — Assessment & Plan Note (Signed)
BMI 42 today.  She has not had any success with diet and exercise.  He had tried a GLP-1 agonist in the past however insurance would not pay for it however she is currently on a new insurance and thinks that her insurance will pay for it now.  Will try Ozempic 0.25 mg weekly for 4 weeks then increase to 0.5 mg weekly.  We discussed potential side effects including nausea and constipation.  We did discuss referral to medical weight management however she deferred for now.  She will follow-up in a few weeks via MyChart and we can increase the dose of Ozempic as tolerated.  She will continue working on diet and exercise.

## 2022-04-02 NOTE — Assessment & Plan Note (Signed)
Patient concerned about ADHD.  She is interested in possibly starting a stimulant medication.  We did discuss starting nonstimulant medication such as Wellbutrin or Strattera however she declined for now.  Will place referral to psychology for formal ADHD evaluation.

## 2022-04-02 NOTE — Progress Notes (Signed)
   Kayda Imoni Kohen is a 31 y.o. female who presents today for an office visit.  Assessment/Plan:  Chronic Problems Addressed Today: Morbid obesity ([place]) BMI 42 today.  She has not had any success with diet and exercise.  He had tried a GLP-1 agonist in the past however insurance would not pay for it however she is currently on a new insurance and thinks that her insurance will pay for it now.  Will try Ozempic 0.25 mg weekly for 4 weeks then increase to 0.5 mg weekly.  We discussed potential side effects including nausea and constipation.  We did discuss referral to medical weight management however she deferred for now.  She will follow-up in a few weeks via MyChart and we can increase the dose of Ozempic as tolerated.  She will continue working on diet and exercise.  Inattention Patient concerned about ADHD.  She is interested in possibly starting a stimulant medication.  We did discuss starting nonstimulant medication such as Wellbutrin or Strattera however she declined for now.  Will place referral to psychology for formal ADHD evaluation.     Subjective:  HPI:  See A/p for status of chronic conditions.    Her main concern today is weight. She has been working hard on diet and exercise and has not had much improvement in weight.  She would like to start medication to help with weight loss.  We had prescribed her Ozempic and Mounjaro in the past however her insurance would not pay for it.  She has also noticed that it has been more difficult for her to stay focused at work. She has recently had increased workload. She frequently gets distracted easily.. She has never had any issues with this in the past.        Objective:  Physical Exam: BP 106/67 (BP Location: Left Arm, Patient Position: Sitting)   Pulse 72   Temp 98.2 F (36.8 C) (Temporal)   Ht '5\' 5"'$  (1.651 m)   Wt 254 lb 3.2 oz (115.3 kg)   LMP 03/19/2022 (Exact Date)   SpO2 97%   BMI 42.30 kg/m   Wt Readings from  Last 3 Encounters:  04/02/22 254 lb 3.2 oz (115.3 kg)  03/18/22 255 lb (115.7 kg)  02/23/22 255 lb (115.7 kg)    Gen: No acute distress, resting comfortably CV: Regular rate and rhythm with no murmurs appreciated Pulm: Normal work of breathing, clear to auscultation bilaterally with no crackles, wheezes, or rhonchi Neuro: Grossly normal, moves all extremities Psych: Normal affect and thought content      Karista Aispuro M. [name], MD 04/02/2022 7:59 AM

## 2022-04-03 ENCOUNTER — Other Ambulatory Visit: Payer: Self-pay | Admitting: *Deleted

## 2022-04-03 NOTE — Telephone Encounter (Signed)
Spoke with patient, stated will pick up Ozempic from [facility] today at a cost of $20 No assistant needed at this time form our office

## 2022-04-15 ENCOUNTER — Ambulatory Visit (INDEPENDENT_AMBULATORY_CARE_PROVIDER_SITE_OTHER): Payer: BC Managed Care – PPO | Admitting: Psychology

## 2022-04-15 DIAGNOSIS — F89 Unspecified disorder of psychological development: Secondary | ICD-10-CM

## 2022-04-15 NOTE — Progress Notes (Signed)
Date: 04/15/2022 Appointment Start Time: 7am Duration: 110 minutes Provider: Clarice Pole, PsyD Type of Session: Initial Appointment for Evaluation  Location of Patient: Home Location of Provider: Provider's Home (private office) Type of Contact: WebEx video visit with audio  Session Content:  Prior to proceeding with today's appointment, two pieces of identifying information were obtained from [place] to verify identity. In addition, Charmian's physical location at the time of this appointment was obtained. In the event of technical difficulties, Tammye shared a phone number she could be reached at. Leylani and this provider participated in today's telepsychological service. Kaylamarie denied anyone else being present in the room or on the virtual appointment.  The provider's role was explained to [place]. The provider reviewed and discussed issues of confidentiality, privacy, and limits therein (e.g., reporting obligations). In addition to verbal informed consent, written informed consent for psychological services was obtained from [place] prior to the initial appointment. Written consent included information concerning the practice, financial arrangements, and confidentiality and patients' rights. Since the clinic is not a 24/7 crisis center, mental health emergency resources were shared, and the provider explained e-mail, voicemail, and/or other messaging systems should be utilized only for non-emergency reasons. This provider also explained that information obtained during appointments will be placed in their electronic medical record in a confidential manner. Sruthi verbally acknowledged understanding of the aforementioned and agreed to use mental health emergency resources discussed if needed. Moreover, Danisa agreed information may be shared with other [facility] or their referring provider(s) as needed for coordination of care. By signing the new patient documents, Rion provided written consent for  coordination of care. Isidra verbally acknowledged understanding she is ultimately responsible for understanding her insurance benefits as it relates to reimbursement of telepsychological and in-person services. This provider also reviewed confidentiality, as it relates to telepsychological services, as well as the rationale for telepsychological services. This provider further explained that video should not be captured, photos should not be taken, nor should testing stimuli be copied or recorded as it would be a copyright violation. Clarice expressed understanding of the aforementioned, and verbally consented to proceed.  Rhaya completed the psychiatric diagnostic evaluation (history, including past, family, social, and  psychiatric history; behavioral observations; and establishment of a provisional diagnosis). The evaluation was completed in 110 minutes. Code [id number] was billed.   Mental Status Examination:  Appearance:  neat Behavior: appropriate to circumstances Mood: anxious Affect: mood congruent Speech: WNL and pressured Eye Contact: appropriate Psychomotor Activity: restless Thought Process: linear, logical, and goal directed and denies suicidal, homicidal, and self-harm ideation, plan and intent Content/Perceptual Disturbances: none Orientation: AAOx4 Cognition/Sensorium: intact Insight: good Judgment: good  Provisional DSM-5 diagnosis(es):  F89 Unspecified Disorder of Psychological Development   Plan: Marchella is currently scheduled for an appointment on 04/23/2021 at 4pm via WebEx video visit with audio.                 Dolores Lory, PsyD

## 2022-04-20 ENCOUNTER — Encounter: Payer: Self-pay | Admitting: Family Medicine

## 2022-04-21 NOTE — Telephone Encounter (Signed)
Please advise 

## 2022-04-21 NOTE — Telephone Encounter (Signed)
I appreciate the update. I am ok with her stopping the lexapro if she feels like her mood is stable. Recommend first she decrease the dose to half for 1-2 weeks and then check in with [place] before we wean off further.  Algis Greenhouse. [name], MD 04/21/2022 9:38 PM

## 2022-04-22 ENCOUNTER — Encounter: Payer: Self-pay | Admitting: Family Medicine

## 2022-04-23 ENCOUNTER — Ambulatory Visit: Payer: BC Managed Care – PPO | Admitting: Psychology

## 2022-04-23 DIAGNOSIS — F89 Unspecified disorder of psychological development: Secondary | ICD-10-CM

## 2022-04-23 NOTE — Telephone Encounter (Signed)
I appreciate her update. If she is doing well then it is fine for her to go to the 0.'5mg'$  weekly dose. Please send in new rx if needed.  Would like for her to follow up with me in a few weeks.  Algis Greenhouse. [name], MD 04/23/2022 2:48 PM

## 2022-04-23 NOTE — Progress Notes (Signed)
Date: 04/23/2022   Appointment Start Time: 4pm Duration: 95 minutes Provider: Clarice Pole, PsyD Type of Session: Testing Appointment for Evaluation  Location of Patient:  In a private location at her employment ([place] [address]) Location of Provider: Provider's Home (private office) Type of Contact: WebEx video visit with audio  Session Content: Today's appointment was a telepsychological visit due to COVID-19. [name] is aware it is her responsibility to secure confidentiality on her end of the session. Prior to proceeding with today's appointment, [name]'s physical location at the time of this appointment was obtained as well a phone number she could be reached at in the event of technical difficulties. [name] denied anyone else being present in the room or on the virtual appointment. This provider reviewed that video should not be captured, photos should not be taken, nor should testing stimuli be copied or recorded as it would be a copyright violation. [name] expressed understanding of the aforementioned, and verbally consented to proceed. The WAIS-IV was administered, scored, and interpreted by this evaluator  Billing codes will be input on the feedback appointment. There are no billing codes for the testing appointment.   Provisional DSM-5 diagnosis(es):  F89 Unspecified Disorder of Psychological Development   Plan: [name] was scheduled for a feedback appointment on 04/29/2021 at 2pm via WebEx video visit with audio.                Dolores Lory, PsyD

## 2022-04-29 ENCOUNTER — Ambulatory Visit (INDEPENDENT_AMBULATORY_CARE_PROVIDER_SITE_OTHER): Payer: BC Managed Care – PPO | Admitting: Psychology

## 2022-04-29 DIAGNOSIS — F900 Attention-deficit hyperactivity disorder, predominantly inattentive type: Secondary | ICD-10-CM | POA: Diagnosis not present
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
## 2022-05-04 ENCOUNTER — Encounter: Payer: Self-pay | Admitting: Family Medicine

## 2022-05-04 ENCOUNTER — Other Ambulatory Visit: Payer: Self-pay

## 2022-05-06 ENCOUNTER — Other Ambulatory Visit: Payer: Self-pay | Admitting: *Deleted

## 2022-05-06 MED ORDER — OZEMPIC (0.25 OR 0.5 MG/DOSE) 2 MG/3ML ~~LOC~~ SOPN: 0.5000 mg | PEN_INJECTOR | SUBCUTANEOUS | 1 refills | Status: DC

## 2022-05-06 NOTE — Telephone Encounter (Signed)
Form printed

## 2022-05-08 NOTE — Telephone Encounter (Signed)
Left message to return call to our office at their convenience.

## 2022-05-08 NOTE — Telephone Encounter (Signed)
Please verify the dose of ozempic she is on.  If she is taking 0.5 mg weekly, we can send in a prescription for 5 mg Mounjaro weekly.  Would like for her to follow-up with [place] in a few weeks after making medication change.

## 2022-05-12 DIAGNOSIS — M67911 Unspecified disorder of synovium and tendon, right shoulder: Secondary | ICD-10-CM | POA: Diagnosis not present

## 2022-05-13 ENCOUNTER — Encounter: Payer: Self-pay | Admitting: Internal Medicine

## 2022-05-14 ENCOUNTER — Ambulatory Visit: Payer: BC Managed Care – PPO | Admitting: Family Medicine

## 2022-05-14 ENCOUNTER — Telehealth: Payer: Self-pay | Admitting: *Deleted

## 2022-05-14 NOTE — Telephone Encounter (Signed)
(  Key: [id number]) - [id number] Ozempic (0.25 or 0.5 MG/DOSE) '2MG'$ Fayne Mediate pen-injectors Status: PA Request Sent: January 25th, 2024 Waiting for determination

## 2022-05-20 NOTE — Telephone Encounter (Signed)
Ozempic (semaglutide). After careful consideration and review of your prescription plan's drug list and the information sent to [place], this request was not approved.

## 2022-05-21 ENCOUNTER — Encounter: Payer: Self-pay | Admitting: Family Medicine

## 2022-05-21 ENCOUNTER — Ambulatory Visit (INDEPENDENT_AMBULATORY_CARE_PROVIDER_SITE_OTHER): Payer: BC Managed Care – PPO | Admitting: Family Medicine

## 2022-05-21 DIAGNOSIS — F909 Attention-deficit hyperactivity disorder, unspecified type: Secondary | ICD-10-CM

## 2022-05-21 DIAGNOSIS — F419 Anxiety disorder, unspecified: Secondary | ICD-10-CM | POA: Diagnosis not present

## 2022-05-21 MED ORDER — LISDEXAMFETAMINE DIMESYLATE 20 MG PO CAPS: 20.0000 mg | ORAL_CAPSULE | Freq: Every day | ORAL | 0 refills | Status: DC

## 2022-05-21 NOTE — Progress Notes (Signed)
   Tammy Knight is a 32 y.o. female who presents today for an office visit.  Assessment/Plan:  Chronic Problems Addressed Today: ADHD Patient recently diagnosed with ADHD by her psychologist.  They are faxing over her evaluation and records however we are able to see this diagnosis in epic.  She is interested in starting a stimulant medication.  We discussed options including Vyvanse versus Adderall XR.  We discussed potential side effects including palpitations, irritability, insomnia, anxiety, decreased appetite, etc.  Will start Vyvanse 20 mg daily.  We did discuss that this is a controlled substance and that she will need to come back in 4 to 6 weeks for monitoring.  Eventually we should be able to hopefully space out to 3 to 6 months.  We did also discuss there may be a trial and error process until we find the right medication and dosage that works best for her.  She will follow-up me in a couple of weeks via MyChart if she runs into any issues.  If does not do well with Vyvanse would consider trial of Adderall XR.  We will need to update controlled substance contract when she gets on the stable dose of medication.  Anxiety disorder Seeing therapist.  Overall doing well on Lexapro 20 mg daily.  Morbid obesity ([place]) BMI is 42 today.  Insurance will potentially pay for [employer].  They will no longer pay for Ozempic.  Since we will be starting her on Vyvanse today we will hold off on starting GLP-1 agonist at this time though we will reevaluate going forward over the next several weeks and when she is on a stable dose of a stimulant would consider GLP-1 agonist if needed at that time.     Subjective:  HPI:  See A/p for status of chronic conditions.  Since our last visit she has seen a psychologist and was diagnosed with ADHD.  She has been having ongoing issues with difficulty focusing and paying attention.  She is interested in starting possible medications for this.  She is  otherwise doing well.  We previously had her on Ozempic however insurance stopped paying for this and are now requesting for her to be on Mounjaro.       Objective:  Physical Exam: BP 124/74   Pulse 75   Temp 98 F (36.7 C) (Temporal)   Ht '5\' 5"'$  (1.651 m)   Wt 252 lb 12.8 oz (114.7 kg)   LMP 05/04/2022   SpO2 98%   BMI 42.07 kg/m   Gen: No acute distress, resting comfortably CV: Regular rate and rhythm with no murmurs appreciated Pulm: Normal work of breathing, clear to auscultation bilaterally with no crackles, wheezes, or rhonchi Neuro: Grossly normal, moves all extremities Psych: Normal affect and thought content      Gannon Heinzman M. [name], MD 05/21/2022 9:26 AM

## 2022-05-21 NOTE — Telephone Encounter (Signed)
See note

## 2022-05-21 NOTE — Telephone Encounter (Signed)
Form printed and placed on PCP office for review

## 2022-05-21 NOTE — Patient Instructions (Signed)
It was very nice to see you today!  Will start Vyvanse.  Please send me a message in a few weeks to let me know how this is working for you.  Will see you back in 4 to 6 weeks for medication recheck.  Take care, Dr [name]  PLEASE NOTE:  If you had any lab tests, please let us know if you have not heard back within a few days. You may see your results on mychart before we have a chance to review them but we will give you a call once they are reviewed by [place].   If we ordered any referrals today, please let us know if you have not heard from their office within the next week.   If you had any urgent prescriptions sent in today, please check with the pharmacy within an hour of our visit to make sure the prescription was transmitted appropriately.   Please try these tips to maintain a healthy lifestyle:  Eat at least 3 REAL meals and 1-2 snacks per day.  Aim for no more than 5 hours between eating.  If you eat breakfast, please do so within one hour of getting up.   Each meal should contain half fruits/vegetables, one quarter protein, and one quarter carbs (no bigger than a computer mouse)  Cut down on sweet beverages. This includes juice, soda, and sweet tea.   Drink at least 1 glass of water with each meal and aim for at least 8 glasses per day  Exercise at least 150 minutes every week.

## 2022-05-21 NOTE — Assessment & Plan Note (Addendum)
Patient recently diagnosed with ADHD by her psychologist.  They are faxing over her evaluation and records however we are able to see this diagnosis in epic.  She is interested in starting a stimulant medication.  We discussed options including Vyvanse versus Adderall XR.  We discussed potential side effects including palpitations, irritability, insomnia, anxiety, decreased appetite, etc.  Will start Vyvanse 20 mg daily.  We did discuss that this is a controlled substance and that she will need to come back in 4 to 6 weeks for monitoring.  Eventually we should be able to hopefully space out to 3 to 6 months.  We did also discuss there may be a trial and error process until we find the right medication and dosage that works best for her.  She will follow-up me in a couple of weeks via MyChart if she runs into any issues.  If does not do well with Vyvanse would consider trial of Adderall XR.  We will need to update controlled substance contract when she gets on the stable dose of medication.

## 2022-05-21 NOTE — Assessment & Plan Note (Signed)
Seeing therapist.  Overall doing well on Lexapro 20 mg daily.

## 2022-05-21 NOTE — Assessment & Plan Note (Signed)
BMI is 42 today.  Insurance will potentially pay for [employer].  They will no longer pay for Ozempic.  Since we will be starting her on Vyvanse today we will hold off on starting GLP-1 agonist at this time though we will reevaluate going forward over the next several weeks and when she is on a stable dose of a stimulant would consider GLP-1 agonist if needed at that time.

## 2022-05-22 NOTE — Telephone Encounter (Signed)
See note

## 2022-05-22 NOTE — Telephone Encounter (Signed)
We discussed this at her visit yesterday. We are holding off on mounjaro and ozempic for now while we are getting her on a medication for her ADHD.  Tammy Knight. [name], MD 05/22/2022 10:02 AM

## 2022-05-26 ENCOUNTER — Encounter: Payer: Self-pay | Admitting: Family Medicine

## 2022-05-27 ENCOUNTER — Telehealth: Payer: Self-pay | Admitting: *Deleted

## 2022-05-27 NOTE — Telephone Encounter (Signed)
(  Key: [id number]) - [id number] Lisdexamfetamine Dimesylate '20MG'$  capsules Status: PA RequestSent: February 7th, 2024 Waiting for determination

## 2022-05-28 NOTE — Telephone Encounter (Signed)
Your request has been approved Your PA request has been approved. Patient/pharmacy  aware

## 2022-06-10 ENCOUNTER — Telehealth: Payer: Self-pay | Admitting: *Deleted

## 2022-06-10 NOTE — Telephone Encounter (Signed)
(  KeyHollice [id number] Ozempic (0.25 or 0.5 MG/DOSE) 2MG/3ML pen-injectors Status: PA RequestSent: February 21st, 2024

## 2022-06-11 ENCOUNTER — Telehealth: Payer: Self-pay

## 2022-06-11 NOTE — Patient Outreach (Signed)
  Care Coordination   06/11/2022 Name: Tammy Knight MRN: [id number] DOB: 08-30-1990   Care Coordination Outreach Attempts:  An unsuccessful telephone outreach was attempted today to offer the patient information about available care coordination services as a benefit of their health plan.   Follow Up Plan:  Additional outreach attempts will be made to offer the patient care coordination information and services.   Encounter Outcome:  No Answer   Care Coordination Interventions:  No, not indicated    Jone Baseman, RN, MSN [facility] Care Management Coordinator Direct Line [phone]

## 2022-06-18 NOTE — Telephone Encounter (Signed)
:   Denial of initial request for coverage of Ozempic (semaglutide

## 2022-06-23 ENCOUNTER — Encounter: Payer: Self-pay | Admitting: Family Medicine

## 2022-06-23 ENCOUNTER — Ambulatory Visit (INDEPENDENT_AMBULATORY_CARE_PROVIDER_SITE_OTHER): Payer: BC Managed Care – PPO | Admitting: Family Medicine

## 2022-06-23 VITALS — BP 102/67 | HR 71 | Temp 98.0°F | Ht 65.0 in | Wt 253.2 lb

## 2022-06-23 DIAGNOSIS — F909 Attention-deficit hyperactivity disorder, unspecified type: Secondary | ICD-10-CM

## 2022-06-23 DIAGNOSIS — R5383 Other fatigue: Secondary | ICD-10-CM

## 2022-06-23 LAB — IBC + FERRITIN
Ferritin: 9.3 ng/mL — ABNORMAL LOW (ref 10.0–291.0)
Iron: 76 ug/dL (ref 42–145)
Saturation Ratios: 22.3 % (ref 20.0–50.0)
TIBC: 340.2 ug/dL (ref 250.0–450.0)
Transferrin: 243 mg/dL (ref 212.0–360.0)

## 2022-06-23 LAB — TSH: TSH: 1.34 u[IU]/mL (ref 0.35–5.50)

## 2022-06-23 LAB — COMPREHENSIVE METABOLIC PANEL
ALT: 21 U/L (ref 0–35)
AST: 18 U/L (ref 0–37)
Albumin: 3.9 g/dL (ref 3.5–5.2)
Alkaline Phosphatase: 65 U/L (ref 39–117)
BUN: 18 mg/dL (ref 6–23)
CO2: 26 mEq/L (ref 19–32)
Calcium: 9.1 mg/dL (ref 8.4–10.5)
Chloride: 103 mEq/L (ref 96–112)
Creatinine, Ser: 0.5 mg/dL (ref 0.40–1.20)
GFR: 125.54 mL/min (ref >60.00)
Glucose, Bld: 74 mg/dL (ref 70–99)
Potassium: 4 mEq/L (ref 3.5–5.1)
Sodium: 138 mEq/L (ref 135–145)
Total Bilirubin: 0.4 mg/dL (ref 0.2–1.2)
Total Protein: 6.4 g/dL (ref 6.0–8.3)

## 2022-06-23 LAB — VITAMIN D 25 HYDROXY (VIT D DEFICIENCY, FRACTURES): VITD: 18.24 ng/mL — ABNORMAL LOW (ref 30.00–100.00)

## 2022-06-23 LAB — VITAMIN B12: Vitamin B-12: 374 pg/mL (ref 211–911)

## 2022-06-23 LAB — CBC
HCT: 38 % (ref 36.0–46.0)
Hemoglobin: 12.9 g/dL (ref 12.0–15.0)
MCHC: 33.8 g/dL (ref 30.0–36.0)
MCV: 90.5 fl (ref 78.0–100.0)
Platelets: 323 10*3/uL (ref 150.0–400.0)
RBC: 4.2 Mil/uL (ref 3.87–5.11)
RDW: 13 % (ref 11.5–15.5)
WBC: 9.3 10*3/uL (ref 4.0–10.5)

## 2022-06-23 LAB — HEMOGLOBIN A1C: Hgb A1c MFr Bld: 5.3 % (ref 4.6–6.5)

## 2022-06-23 MED ORDER — TIRZEPATIDE 2.5 MG/0.5ML ~~LOC~~ SOAJ: 2.5000 mg | SUBCUTANEOUS | 0 refills | Status: DC

## 2022-06-23 MED ORDER — MECLIZINE HCL 25 MG PO TABS: 25.0000 mg | ORAL_TABLET | Freq: Three times a day (TID) | ORAL | 0 refills | Status: DC | PRN

## 2022-06-23 MED ORDER — LISDEXAMFETAMINE DIMESYLATE 20 MG PO CAPS: 20.0000 mg | ORAL_CAPSULE | Freq: Every day | ORAL | 0 refills | Status: DC

## 2022-06-23 NOTE — Assessment & Plan Note (Signed)
We recently started her on Vyvanse 20 mg daily.  She feels like this has worked well for her.  Helps with her ability to stay focused and on task.  No significant side effects.  She would like to continue with her current dose.  Database was reviewed without red flags.  Will refill today.  Follow-up 3 months for medication recheck.  She will let me know if any new issues arise.

## 2022-06-23 NOTE — Patient Instructions (Signed)
It was very nice to see you today!  I am glad that the Vyvanse is working well for you.  Will send a prescription in in for refill today.  We will also send in [place].  Please let me know how this is working for you in a few weeks and we can adjust the dose.  You probably are still having some symptoms of your URI.  We will check blood work today.  Please take the meclizine as needed.  Let me know if not improving.  Please come back in 3 months for medication check.  Come back sooner if needed.  Take care, Dr [name]  PLEASE NOTE:  If you had any lab tests, please let us know if you have not heard back within a few days. You may see your results on mychart before we have a chance to review them but we will give you a call once they are reviewed by [place].   If we ordered any referrals today, please let us know if you have not heard from their office within the next week.   If you had any urgent prescriptions sent in today, please check with the pharmacy within an hour of our visit to make sure the prescription was transmitted appropriately.   Please try these tips to maintain a healthy lifestyle:  Eat at least 3 REAL meals and 1-2 snacks per day.  Aim for no more than 5 hours between eating.  If you eat breakfast, please do so within one hour of getting up.   Each meal should contain half fruits/vegetables, one quarter protein, and one quarter carbs (no bigger than a computer mouse)  Cut down on sweet beverages. This includes juice, soda, and sweet tea.   Drink at least 1 glass of water with each meal and aim for at least 8 glasses per day  Exercise at least 150 minutes every week.

## 2022-06-23 NOTE — Assessment & Plan Note (Signed)
BMI is 42.  She is interested in starting GLP-1 agonist.  Insurance would not pay for Ozempic because she believes they will pay for [facility].  Will start 2.5 mg weekly.  She will follow-up in a couple weeks and we can increase the dose as tolerated.  Discussed potential side effects.

## 2022-06-23 NOTE — Progress Notes (Signed)
Tammy Knight is a 32 y.o. female who presents today for an office visit.  Assessment/Plan:  New/Acute Problems: URI / Vertigo / Fatigue Most of her URI symptoms have resolved though still has occasional vertigo and ongoing fatigue. She does have some eustachian tube dysfunction on exam today. Her neurologic exam today is reassuring - do not think we need to get any imaging at this point.  Vertigo likely related to her eustachian tube dysfunction she may have a small amount of underlying BPPV as well.  Will start meclizine.  She can use over-the-counter medications as needed as well.  Will check labs per patient request today due to her fatigue over the last week.  Her fatigue is probably related to her recent URI as well.  Anticipate she will continue to improve over the next 1 to 2 weeks though she will let us know if symptoms are not improving.  We discussed reasons return to care.  Chronic Problems Addressed Today: ADHD We recently started her on Vyvanse 20 mg daily.  She feels like this has worked well for her.  Helps with her ability to stay focused and on task.  No significant side effects.  She would like to continue with her current dose.  Database was reviewed without red flags.  Will refill today.  Follow-up 3 months for medication recheck.  She will let me know if any new issues arise.  Morbid obesity (HCC) BMI is 42.  She is interested in starting GLP-1 agonist.  Insurance would not pay for Ozempic because she believes they will pay for [facility]  Will start 2.5 mg weekly.  She will follow-up in a couple weeks and we can increase the dose as tolerated.  Discussed potential side effects.     Subjective:  HPI:  See A/P for status of chronic conditions.  Patient is here for follow-up.  We saw her a month ago.  At that time we started ADHD medication Vyvanse 20 mg daily.  She feels like it is working well with her ability to focus and stay and on task.  No significant side  effects. No palpitations. No difficult sleeping. No mood changes  She did have URI symptoms about a week ago. This has mostly resolved but is still having some room spinning sensations.  Initially had pretty severe symptoms with cough, fatigue, and malaise.  Her vertigo is worse with certain motions. Still has some cough and congestion. Never had issues with vertigo in the past.  No fevers or chills.    Husband and daughter have been sick with similar symptoms recently.       Objective:  Physical Exam: BP 102/67   Pulse 71   Temp 98 F (36.7 C) (Temporal)   Ht '5\' 5"'$  (1.651 m)   Wt 253 lb 3.2 oz (114.9 kg)   SpO2 99%   BMI 42.13 kg/m   Wt Readings from Last 3 Encounters:  06/23/22 253 lb 3.2 oz (114.9 kg)  05/21/22 252 lb 12.8 oz (114.7 kg)  04/02/22 254 lb 3.2 oz (115.3 kg)    Gen: No acute distress, resting comfortably HEENT TMs are clear effusion. CV: Regular rate and rhythm with no murmurs appreciated Pulm: Normal work of breathing, clear to auscultation bilaterally with no crackles, wheezes, or rhonchi Neuro: Current nerves II through XII intact.  Finger-nose-finger testing intact bilaterally.  Sensation light touch intact throughout.  Strength intact throughout. Psych: Normal affect and thought content      Penni Penado M.  [name], MD 06/23/2022 1:30 PM

## 2022-06-24 ENCOUNTER — Telehealth: Payer: Self-pay | Admitting: *Deleted

## 2022-06-24 NOTE — Telephone Encounter (Signed)
Unable to contact patient, voice mail is full

## 2022-06-24 NOTE — Telephone Encounter (Signed)
PA Rx Tammy Knight was denied  Plan only cover for certain health condition

## 2022-06-24 NOTE — Telephone Encounter (Signed)
(  Key: [id number]) [id number] Mounjaro 2.'5MG'$ /0.5ML pen-injectors Status: PA RequestCreated: March 5th, 2024 [id number] Sent: March 6th, 2024 Waiting for determination

## 2022-06-26 NOTE — Progress Notes (Signed)
Please inform patient of the following:  Her vitamin D is a little bit low but everything else is stable.  It is possible this could be contributing some to her fatigue.  Recommend she start 50,000 IUs once weekly.  Please send a prescription.  We should recheck in about 3 to 6 months.  The last of her labs are all stable.  She should let us know if her fatigue is not improving.

## 2022-06-29 ENCOUNTER — Other Ambulatory Visit: Payer: Self-pay

## 2022-06-29 DIAGNOSIS — E559 Vitamin D deficiency, unspecified: Secondary | ICD-10-CM

## 2022-06-29 MED ORDER — VITAMIN D (ERGOCALCIFEROL) 1.25 MG (50000 UNIT) PO CAPS: 50000.0000 [IU] | ORAL_CAPSULE | ORAL | 0 refills | Status: DC

## 2022-07-06 ENCOUNTER — Other Ambulatory Visit: Payer: Self-pay | Admitting: *Deleted

## 2022-07-06 ENCOUNTER — Encounter: Payer: Self-pay | Admitting: Family Medicine

## 2022-07-06 MED ORDER — TIRZEPATIDE 2.5 MG/0.5ML ~~LOC~~ SOAJ: 2.5000 mg | SUBCUTANEOUS | 0 refills | Status: DC

## 2022-07-08 ENCOUNTER — Telehealth: Payer: Self-pay | Admitting: *Deleted

## 2022-07-08 NOTE — Telephone Encounter (Signed)
(  KeyMathews Robinsons [id number] Mounjaro 2.5MG /0.5ML pen-injectors Status: PA Request Sent: March 20th, 2024 Waiting for determination

## 2022-07-09 NOTE — Telephone Encounter (Signed)
your request was denied: Your plan only covers this drug when it is used for certain health conditions. Covered use is for type 2 diabetes mellitus

## 2022-07-14 NOTE — Telephone Encounter (Signed)
Please advise 

## 2022-07-14 NOTE — Telephone Encounter (Signed)
Ok to send in.  Tammy Knight. [name], MD 07/14/2022 8:31 PM

## 2022-07-15 ENCOUNTER — Other Ambulatory Visit: Payer: Self-pay

## 2022-07-15 MED ORDER — WEGOVY 0.5 MG/0.5ML ~~LOC~~ SOAJ: 0.5000 mg | SUBCUTANEOUS | 0 refills | Status: DC

## 2022-07-28 ENCOUNTER — Other Ambulatory Visit (INDEPENDENT_AMBULATORY_CARE_PROVIDER_SITE_OTHER): Payer: BC Managed Care – PPO

## 2022-07-28 DIAGNOSIS — E559 Vitamin D deficiency, unspecified: Secondary | ICD-10-CM | POA: Diagnosis not present

## 2022-07-28 LAB — VITAMIN D 25 HYDROXY (VIT D DEFICIENCY, FRACTURES): VITD: 34.74 ng/mL (ref 30.00–100.00)

## 2022-07-28 NOTE — Progress Notes (Signed)
Please inform patient of the following:  Vitamin D is back to normal. She should continue with maintenance therapy 1000 to 2000 IUs once daily and we can recheck again in 6 to 12 months.

## 2022-08-04 NOTE — Telephone Encounter (Signed)
Compounded semaglutide is an option but I do not have much experience prescribing this or sending him to the pharmacy.  We can try having her follow-up with medical weight management clinic with [place] or with the goal if she wishes - they may have more options available.

## 2022-08-05 ENCOUNTER — Other Ambulatory Visit: Payer: Self-pay

## 2022-08-05 ENCOUNTER — Other Ambulatory Visit: Payer: Self-pay | Admitting: Family Medicine

## 2022-08-05 MED ORDER — LISDEXAMFETAMINE DIMESYLATE 20 MG PO CAPS: 20.0000 mg | ORAL_CAPSULE | Freq: Every day | ORAL | 0 refills | Status: DC

## 2022-08-05 NOTE — Telephone Encounter (Signed)
Last refill: 06/23/22 #30, 0 Last OV: 06/23/22 dx. ADHD

## 2022-08-05 NOTE — Telephone Encounter (Signed)
No problem!  If you do change your mind just give [place] a call! Have a great day!

## 2022-08-19 DIAGNOSIS — Z131 Encounter for screening for diabetes mellitus: Secondary | ICD-10-CM | POA: Diagnosis not present

## 2022-08-19 DIAGNOSIS — E611 Iron deficiency: Secondary | ICD-10-CM | POA: Diagnosis not present

## 2022-08-19 DIAGNOSIS — E038 Other specified hypothyroidism: Secondary | ICD-10-CM | POA: Diagnosis not present

## 2022-08-19 DIAGNOSIS — K5909 Other constipation: Secondary | ICD-10-CM | POA: Diagnosis not present

## 2022-08-19 DIAGNOSIS — Z6841 Body Mass Index (BMI) 40.0 and over, adult: Secondary | ICD-10-CM | POA: Diagnosis not present

## 2022-08-19 DIAGNOSIS — Z13220 Encounter for screening for lipoid disorders: Secondary | ICD-10-CM | POA: Diagnosis not present

## 2022-08-19 DIAGNOSIS — E559 Vitamin D deficiency, unspecified: Secondary | ICD-10-CM | POA: Diagnosis not present

## 2022-08-19 DIAGNOSIS — N951 Menopausal and female climacteric states: Secondary | ICD-10-CM | POA: Diagnosis not present

## 2022-08-19 DIAGNOSIS — F419 Anxiety disorder, unspecified: Secondary | ICD-10-CM | POA: Diagnosis not present

## 2022-08-19 LAB — HEPATIC FUNCTION PANEL
ALT: 12 U/L (ref 7–35)
AST: 16 (ref 13–35)
Alkaline Phosphatase: 57 (ref 25–125)
Bilirubin, Total: 0.4

## 2022-08-19 LAB — LIPID PANEL
Cholesterol: 151 (ref 0–200)
HDL: 45 (ref 35–70)
LDL Cholesterol: 95
Triglycerides: 54 (ref 40–160)

## 2022-08-19 LAB — IRON,TIBC AND FERRITIN PANEL
Ferritin: 14
Iron: 68

## 2022-08-19 LAB — TESTOSTERONE: Testosterone: 28

## 2022-08-19 LAB — COMPREHENSIVE METABOLIC PANEL
Albumin: 4 (ref 3.5–5.0)
Calcium: 9.2 (ref 8.7–10.7)
eGFR: 114

## 2022-08-19 LAB — BASIC METABOLIC PANEL
BUN: 13 (ref 4–21)
CO2: 26 — AB (ref 13–22)
Chloride: 107 (ref 99–108)
Creatinine: 0.7 (ref 0.5–1.1)
Glucose: 85
Potassium: 4.3 mEq/L (ref 3.5–5.1)
Sodium: 139 (ref 137–147)

## 2022-08-19 LAB — HEMOGLOBIN A1C: Hemoglobin A1C: 5

## 2022-08-19 LAB — CBC AND DIFFERENTIAL
HCT: 37 (ref 36–46)
Hemoglobin: 12.7 (ref 12.0–16.0)
Platelets: 279 10*3/uL (ref 150–400)
WBC: 5.8

## 2022-08-19 LAB — TSH: TSH: 1.12 (ref 0.41–5.90)

## 2022-08-19 LAB — VITAMIN D 25 HYDROXY (VIT D DEFICIENCY, FRACTURES): Vit D, 25-Hydroxy: 43.7

## 2022-08-19 LAB — CBC: RBC: 4.09 (ref 3.87–5.11)

## 2022-08-21 DIAGNOSIS — Z1331 Encounter for screening for depression: Secondary | ICD-10-CM | POA: Diagnosis not present

## 2022-08-21 DIAGNOSIS — Z1339 Encounter for screening examination for other mental health and behavioral disorders: Secondary | ICD-10-CM | POA: Diagnosis not present

## 2022-08-21 DIAGNOSIS — R6882 Decreased libido: Secondary | ICD-10-CM | POA: Diagnosis not present

## 2022-08-21 DIAGNOSIS — F419 Anxiety disorder, unspecified: Secondary | ICD-10-CM | POA: Diagnosis not present

## 2022-08-21 DIAGNOSIS — E038 Other specified hypothyroidism: Secondary | ICD-10-CM | POA: Diagnosis not present

## 2022-08-21 DIAGNOSIS — Z6841 Body Mass Index (BMI) 40.0 and over, adult: Secondary | ICD-10-CM | POA: Diagnosis not present

## 2022-08-27 ENCOUNTER — Telehealth: Payer: Self-pay | Admitting: *Deleted

## 2022-08-27 NOTE — Telephone Encounter (Signed)
(  Tammy Knight) - [id number] 0.5MG /0.5ML auto-injectors Status: PA RequestCreated: May 8th, 2024 [phone]: May 9th, 2024 Waiting for determination

## 2022-09-02 DIAGNOSIS — R252 Cramp and spasm: Secondary | ICD-10-CM | POA: Diagnosis not present

## 2022-09-02 DIAGNOSIS — I87393 Chronic venous hypertension (idiopathic) with other complications of bilateral lower extremity: Secondary | ICD-10-CM | POA: Diagnosis not present

## 2022-09-02 DIAGNOSIS — M79662 Pain in left lower leg: Secondary | ICD-10-CM | POA: Diagnosis not present

## 2022-09-02 DIAGNOSIS — M7989 Other specified soft tissue disorders: Secondary | ICD-10-CM | POA: Diagnosis not present

## 2022-09-02 DIAGNOSIS — M79661 Pain in right lower leg: Secondary | ICD-10-CM | POA: Diagnosis not present

## 2022-09-02 DIAGNOSIS — R600 Localized edema: Secondary | ICD-10-CM | POA: Diagnosis not present

## 2022-09-02 NOTE — Telephone Encounter (Signed)
ZOXWRU 0.5MG /0.5ML auto-injectors Status: PA Response - Denied

## 2022-09-04 DIAGNOSIS — E038 Other specified hypothyroidism: Secondary | ICD-10-CM | POA: Diagnosis not present

## 2022-09-04 DIAGNOSIS — Z6841 Body Mass Index (BMI) 40.0 and over, adult: Secondary | ICD-10-CM | POA: Diagnosis not present

## 2022-09-15 ENCOUNTER — Other Ambulatory Visit: Payer: Self-pay | Admitting: Family Medicine

## 2022-09-15 MED ORDER — LISDEXAMFETAMINE DIMESYLATE 20 MG PO CAPS: 20.0000 mg | ORAL_CAPSULE | Freq: Every day | ORAL | 0 refills | Status: DC

## 2022-09-15 NOTE — Telephone Encounter (Signed)
Last ov: 06/23/2022 Last refill: 08/05/2022 Disp:   30   Refill 0

## 2022-09-21 ENCOUNTER — Encounter: Payer: Self-pay | Admitting: Family Medicine

## 2022-09-21 ENCOUNTER — Ambulatory Visit (INDEPENDENT_AMBULATORY_CARE_PROVIDER_SITE_OTHER): Payer: BC Managed Care – PPO | Admitting: Family Medicine

## 2022-09-21 VITALS — BP 111/73 | HR 76 | Temp 98.0°F | Ht 65.0 in | Wt 256.4 lb

## 2022-09-21 DIAGNOSIS — F909 Attention-deficit hyperactivity disorder, unspecified type: Secondary | ICD-10-CM | POA: Diagnosis not present

## 2022-09-21 DIAGNOSIS — Z6841 Body Mass Index (BMI) 40.0 and over, adult: Secondary | ICD-10-CM | POA: Diagnosis not present

## 2022-09-21 NOTE — Assessment & Plan Note (Signed)
Database reviewed without any red flags.  She is doing well on Vyvanse 20 mg daily.  Helps her stay focused and on task.  No significant side effects.  She would like to continue with current dose.  Does not need refill today.  She will follow-up again in 3 to 6 months.

## 2022-09-21 NOTE — Assessment & Plan Note (Addendum)
BMI 42.  Insurance would not pay for GLP-1 agonist.  She has been following with [facility] and they have started her on Armour Thyroid.  She may be starting compounded semaglutide at some point depending on response to armour thyroid.   We discussed lifestyle modifications.

## 2022-09-21 NOTE — Progress Notes (Signed)
   Tammy Knight is a 32 y.o. female who presents today for an office visit.  Assessment/Plan:  Chronic Problems Addressed Today: ADHD Database reviewed without any red flags.  She is doing well on Vyvanse 20 mg daily.  Helps her stay focused and on task.  No significant side effects.  She would like to continue with current dose.  Does not need refill today.  She will follow-up again in 3 to 6 months.  Morbid obesity (HCC) BMI 42.  Insurance would not pay for GLP-1 agonist.  She has been following with [facility] and they have started her on Armour Thyroid.  She may be starting compounded semaglutide at some point depending on response to armour thyroid.   We discussed lifestyle modifications.     Subjective:  HPI:  See Assessment / plan for status of chronic conditions.  She is here today for medications refill. Since our last visit she has seen [facility] and was started on thyroid replacement.  She has done reasonably well with this.  She is currently on Vyvanse 20 mg daily.  Medications help with her ability to stay focused on task.  No significant side effects.       Objective:  Physical Exam: BP 111/73   Pulse 76   Temp 98 F (36.7 C) (Temporal)   Ht 5\' 5"  (1.651 m)   Wt 256 lb 6.4 oz (116.3 kg)   SpO2 97%   BMI 42.67 kg/m   Gen: No acute distress, resting comfortably CV: Regular rate and rhythm with no murmurs appreciated Pulm: Normal work of breathing, clear to auscultation bilaterally with no crackles, wheezes, or rhonchi Neuro: Grossly normal, moves all extremities Psych: Normal affect and thought content      Mishael Haran M. Jimmey Ralph, MD 09/21/2022 8:10 AM

## 2022-09-21 NOTE — Patient Instructions (Signed)
It was very nice to see you today!  I am glad you are doing well.  We will continue with current medications.  Please come back in 6 months for medication recheck.  Return in about 6 months (around 03/23/2023).   Take care, Dr Jimmey Ralph  PLEASE NOTE:  If you had any lab tests, please let us know if you have not heard back within a few days. You may see your results on mychart before we have a chance to review them but we will give you a call once they are reviewed by [place].   If we ordered any referrals today, please let us know if you have not heard from their office within the next week.   If you had any urgent prescriptions sent in today, please check with the pharmacy within an hour of our visit to make sure the prescription was transmitted appropriately.   Please try these tips to maintain a healthy lifestyle:  Eat at least 3 REAL meals and 1-2 snacks per day.  Aim for no more than 5 hours between eating.  If you eat breakfast, please do so within one hour of getting up.   Each meal should contain half fruits/vegetables, one quarter protein, and one quarter carbs (no bigger than a computer mouse)  Cut down on sweet beverages. This includes juice, soda, and sweet tea.   Drink at least 1 glass of water with each meal and aim for at least 8 glasses per day  Exercise at least 150 minutes every week.

## 2022-09-23 DIAGNOSIS — I83891 Varicose veins of right lower extremities with other complications: Secondary | ICD-10-CM | POA: Diagnosis not present

## 2022-09-28 ENCOUNTER — Telehealth: Payer: Self-pay | Admitting: *Deleted

## 2022-09-28 NOTE — Telephone Encounter (Signed)
error 

## 2022-09-30 DIAGNOSIS — I83892 Varicose veins of left lower extremities with other complications: Secondary | ICD-10-CM | POA: Diagnosis not present

## 2022-09-30 DIAGNOSIS — Z09 Encounter for follow-up examination after completed treatment for conditions other than malignant neoplasm: Secondary | ICD-10-CM | POA: Diagnosis not present

## 2022-09-30 DIAGNOSIS — I83891 Varicose veins of right lower extremities with other complications: Secondary | ICD-10-CM | POA: Diagnosis not present

## 2022-10-01 ENCOUNTER — Ambulatory Visit: Payer: BC Managed Care – PPO | Admitting: Internal Medicine

## 2022-10-14 DIAGNOSIS — I83891 Varicose veins of right lower extremities with other complications: Secondary | ICD-10-CM | POA: Diagnosis not present

## 2022-10-19 ENCOUNTER — Other Ambulatory Visit: Payer: Self-pay | Admitting: Family Medicine

## 2022-10-20 ENCOUNTER — Other Ambulatory Visit: Payer: Self-pay | Admitting: Family Medicine

## 2022-10-20 MED ORDER — LISDEXAMFETAMINE DIMESYLATE 20 MG PO CAPS: 20.0000 mg | ORAL_CAPSULE | Freq: Every day | ORAL | 0 refills | Status: DC

## 2022-10-21 MED ORDER — LISDEXAMFETAMINE DIMESYLATE 20 MG PO CAPS: 20.0000 mg | ORAL_CAPSULE | Freq: Every day | ORAL | 0 refills | Status: DC

## 2022-11-11 DIAGNOSIS — I83892 Varicose veins of left lower extremities with other complications: Secondary | ICD-10-CM | POA: Diagnosis not present

## 2022-11-17 ENCOUNTER — Telehealth: Payer: Self-pay | Admitting: *Deleted

## 2022-11-17 NOTE — Telephone Encounter (Signed)
Your prior authorization for Wegovy has been denied °

## 2022-11-17 NOTE — Telephone Encounter (Signed)
(  Key: [id number]) - [id number] QIONGE 0.5MG /0.5ML auto-injectors Status: PA Request Sent: July 30th, 2024 Waiting for determination

## 2022-11-18 DIAGNOSIS — I83891 Varicose veins of right lower extremities with other complications: Secondary | ICD-10-CM | POA: Diagnosis not present

## 2022-11-23 ENCOUNTER — Other Ambulatory Visit (HOSPITAL_COMMUNITY): Payer: Self-pay

## 2022-11-23 ENCOUNTER — Telehealth: Payer: Self-pay

## 2022-11-23 NOTE — Telephone Encounter (Signed)
Pharmacy Patient Advocate Encounter   Received notification from CoverMyMeds that prior authorization for Wegovy 0.5MG /0.5ML auto-injectors is required/requested.   Insurance verification completed.   The patient is insured through [facility] .   Per test claim: PA required; PA submitted to [facility] via CoverMyMeds Key/confirmation #/E[facility] Status is pending

## 2022-11-23 NOTE — Telephone Encounter (Signed)
Pharmacy Patient Advocate Encounter  Received notification from [facility] that Prior Authorization for wegovy 0.5mg /0.5 ml auto-inj has been APPROVED from 11/23/2022 to 06/23/2023. Ran test claim, Copay is $24.99  PA #/Case ID/Reference #: [id number] ml

## 2022-11-25 ENCOUNTER — Other Ambulatory Visit: Payer: Self-pay | Admitting: Family Medicine

## 2022-11-25 MED ORDER — LISDEXAMFETAMINE DIMESYLATE 20 MG PO CAPS: 20.0000 mg | ORAL_CAPSULE | Freq: Every day | ORAL | 0 refills | Status: DC

## 2022-12-08 ENCOUNTER — Other Ambulatory Visit (HOSPITAL_COMMUNITY): Payer: Self-pay

## 2022-12-15 ENCOUNTER — Other Ambulatory Visit (HOSPITAL_COMMUNITY): Payer: Self-pay

## 2022-12-31 ENCOUNTER — Other Ambulatory Visit: Payer: Self-pay | Admitting: Family Medicine

## 2023-01-01 MED ORDER — LISDEXAMFETAMINE DIMESYLATE 20 MG PO CAPS: 20.0000 mg | ORAL_CAPSULE | Freq: Every day | ORAL | 0 refills | Status: DC

## 2023-01-02 ENCOUNTER — Other Ambulatory Visit: Payer: Self-pay | Admitting: Family Medicine

## 2023-01-06 DIAGNOSIS — Z1151 Encounter for screening for human papillomavirus (HPV): Secondary | ICD-10-CM | POA: Diagnosis not present

## 2023-01-06 DIAGNOSIS — Z01419 Encounter for gynecological examination (general) (routine) without abnormal findings: Secondary | ICD-10-CM | POA: Diagnosis not present

## 2023-01-06 DIAGNOSIS — Z124 Encounter for screening for malignant neoplasm of cervix: Secondary | ICD-10-CM | POA: Diagnosis not present

## 2023-01-06 DIAGNOSIS — Z6841 Body Mass Index (BMI) 40.0 and over, adult: Secondary | ICD-10-CM | POA: Diagnosis not present

## 2023-01-07 ENCOUNTER — Other Ambulatory Visit (HOSPITAL_COMMUNITY): Payer: Self-pay

## 2023-02-10 ENCOUNTER — Other Ambulatory Visit: Payer: Self-pay | Admitting: Family Medicine

## 2023-02-11 MED ORDER — LISDEXAMFETAMINE DIMESYLATE 20 MG PO CAPS: 20.0000 mg | ORAL_CAPSULE | Freq: Every day | ORAL | 0 refills | Status: DC

## 2023-03-10 ENCOUNTER — Ambulatory Visit: Payer: BC Managed Care – PPO | Admitting: Physician Assistant

## 2023-03-10 ENCOUNTER — Telehealth: Payer: Self-pay | Admitting: Family Medicine

## 2023-03-10 ENCOUNTER — Encounter: Payer: Self-pay | Admitting: Physician Assistant

## 2023-03-10 VITALS — BP 120/80 | HR 77 | Temp 98.0°F | Ht 65.0 in | Wt 260.2 lb

## 2023-03-10 DIAGNOSIS — F419 Anxiety disorder, unspecified: Secondary | ICD-10-CM

## 2023-03-10 DIAGNOSIS — F322 Major depressive disorder, single episode, severe without psychotic features: Secondary | ICD-10-CM

## 2023-03-10 MED ORDER — BUPROPION HCL ER (XL) 150 MG PO TB24: 150.0000 mg | ORAL_TABLET | Freq: Every day | ORAL | 0 refills | Status: DC

## 2023-03-10 NOTE — Telephone Encounter (Signed)
Went back and spoke with Tammy Knight about triage determination and patient decision to not go to ED. Informed her that patient didn't want to go to ED since they would more than likely send her to behavioral health for a 3 day watch only for her to be informed to go back to PCP. Pt did want to know that if she isn't seen today, if she were to come and see PCP on 11/22 as initially planned, would he send her to ED.   Per Tammy Knight, she is able to see patient today but wanted it to be known that more than likely she would not be starting pt on a chronic medication. Tammy Knight also stated she is not aware of how PCP would treat pt based on different factors that could change the outcome. I informed pt of this and she verbalized understanding. Pt decided she will be coming in @ 3pm.

## 2023-03-10 NOTE — Telephone Encounter (Signed)
Noted  

## 2023-03-10 NOTE — Patient Instructions (Addendum)
It was great to see you!  If you develop suicidal thoughts, please tell someone and immediately proceed to our local 24/7 crisis center, [facility]. [address], [place], [place] [address] [phone].  Continue Lexapro 20 mg daily  Start Wellbutrin 150 mg xl daily  Follow-up with Jimmey Ralph on Friday  Look into the mental health providers from your insurance provider and see if you can get something scheduled soon  Take care,  Jarold Motto PA-C

## 2023-03-10 NOTE — Progress Notes (Signed)
Tammy Knight is a 32 y.o. female here for a follow-up on an existing problem.  History of Present Illness:   Chief Complaint  Patient presents with   Depression    Pt c/o increase in depression for the past 1.5 weeks.    HPI  Anxiety and Depression Treated with escitalopram 20 mg daily. Reports missing doses periodically and has significant mood swings when this happens. She has been on this for several years and believes escitalopram alone is no longer enough. Reports increased anxiety and depression for the past 1.5 weeks. Does not tolerate sertraline due to fatigue. Feels that episodes of increased depression usually occur one week before starting her menstrual cycle. She has spoken to her GYN physician and does not tolerate oral birth control. Has increased situational stress due to mother's declining health, argument with husband today and concern for daughters health. She does have suicidal ideation but denies any planning. Denies HI. She has done mental health counseling in the past and is exploring restarting.  Does admit to sometimes "not knowing when to stop" when drinking alcohol. Husband drinks daily so it has been hard for her to stop in the past for any prolonged periods of time.  Past Medical History:  Diagnosis Date   Asthma    h/o no inhalers   Chlamydia    Depression    Hay fever    UTI (urinary tract infection)      Social History   Tobacco Use   Smoking status: Former    Current packs/day: 0.00    Types: Cigarettes    Start date: 01/07/2013    Quit date: 01/07/2018    Years since quitting: 5.1   Smokeless tobacco: Never   Tobacco comments:    1-2 cig per day  Vaping Use   Vaping status: Never Used  Substance Use Topics   Alcohol use: Yes    Alcohol/week: 5.0 standard drinks of alcohol    Types: 5 Glasses of wine per week    Comment: 0- 2 drinks every other day   Drug use: No    Past Surgical History:  Procedure Laterality Date    ABDOMINOPLASTY  2014   CESAREAN SECTION N/A 11/24/2019   Procedure: CESAREAN SECTION;  Surgeon: Harold Hedge, MD;  Location: [facility] LD ORS;  Service: Obstetrics;  Laterality: N/A;   LAPAROSCOPIC SALPINGO OOPHERECTOMY Right 06/13/2018   Procedure: LAPAROSCOPIC RIGHT  OOPHORECTOMY;  Surgeon: Linzie Collin, MD;  Location: [facility] ORS;  Service: Gynecology;  Laterality: Right;   TONSILLECTOMY      Family History  Problem Relation Age of Onset   COPD Mother    Glaucoma Mother    Arthritis Mother    Depression Mother    Heart attack Father 68       2014   Alcohol abuse Father    Depression Father    Hyperlipidemia Father    Hypertension Father    Depression Sister    Hearing loss Sister    Birth defects Sister        no thyroid   Diabetes Paternal Grandfather    Breast cancer Maternal Aunt    Ovarian cancer Neg Hx    Colon cancer Neg Hx     Allergies  Allergen Reactions   Penicillins Anaphylaxis   Bee Venom Swelling    Current Medications:   Current Outpatient Medications:    buPROPion (WELLBUTRIN XL) 150 MG 24 hr tablet, Take 1 tablet (150 mg total) by mouth daily., Disp:  30 tablet, Rfl: 0   escitalopram (LEXAPRO) 20 MG tablet, TAKE 1 TABLET BY MOUTH EVERY DAY, Disp: 90 tablet, Rfl: 1   lisdexamfetamine (VYVANSE) 20 MG capsule, Take 1 capsule (20 mg total) by mouth daily., Disp: 30 capsule, Rfl: 0   Review of Systems:   Review of Systems  Constitutional:  Negative for fever and malaise/fatigue.  HENT:  Negative for congestion.   Eyes:  Negative for blurred vision.  Respiratory:  Negative for cough and shortness of breath.   Cardiovascular:  Negative for chest pain, palpitations and leg swelling.  Gastrointestinal:  Negative for vomiting.  Musculoskeletal:  Negative for back pain.  Skin:  Negative for rash.  Neurological:  Negative for loss of consciousness and headaches.  Psychiatric/Behavioral:  Positive for depression. The patient is nervous/anxious.     Vitals:    Vitals:   03/10/23 1458  BP: 120/80  Pulse: 77  Temp: 98 F (36.7 C)  TempSrc: Temporal  SpO2: 98%  Weight: 260 lb 4 oz (118 kg)  Height: 5\' 5"  (1.651 m)     Body mass index is 43.31 kg/m.  Physical Exam:   Physical Exam Constitutional:      Appearance: Normal appearance. She is well-developed.  HENT:     Head: Normocephalic and atraumatic.  Eyes:     General: Lids are normal.     Extraocular Movements: Extraocular movements intact.     Conjunctiva/sclera: Conjunctivae normal.  Pulmonary:     Effort: Pulmonary effort is normal.  Musculoskeletal:        General: Normal range of motion.     Cervical back: Normal range of motion and neck supple.  Skin:    General: Skin is warm and dry.  Neurological:     Mental Status: She is alert and oriented to person, place, and time.  Psychiatric:        Attention and Perception: Attention and perception normal.        Mood and Affect: Mood normal. Affect is tearful.        Behavior: Behavior normal.        Thought Content: Thought content normal.        Judgment: Judgment normal.     Assessment and Plan:   Depression, major, single episode, severe (HCC); Anxiety disorder, unspecified type Worsening Endorses suicidal ideation but no plan; barrier includes her daughter Continue Lexapro 20 mg daily Augment with Wellbutrin 150 mg XL daily - per most recent PCP plan for next step  She has reached out to her insurance about local therapy options and is hoping to schedule something once she receives this -- encouraged her to follow through with this  I have also provided her information on [facility] information provided She is considering intermittent Family Medical Leave Act (FMLA) for appointments for her mental health - discussed reaching out to PCP for this - has follow-up with him in 2 days  I discussed with patient that if they develop any SI, to tell someone immediately and seek  medical attention.    I,Alexander Ruley,acting as a Neurosurgeon for [employer], PA.,have documented all relevant documentation on the behalf of Jarold Motto, PA,as directed by  Jarold Motto, PA while in the presence of Jarold Motto, [place].  I, Jarold Motto, [place], have reviewed all documentation for this visit. The documentation on 03/10/23 for the exam, diagnosis, procedures, and orders are all accurate and complete.  I spent a total of 59 minutes on  this visit, today 03/10/23, which included reviewing previous notes from PCP, ordering tests, discussing plan of care with patient and using shared-decision making on next steps, and documenting the findings in the note.   Jarold Motto, PA-C

## 2023-03-10 NOTE — Telephone Encounter (Signed)
Final outcome: Call EMS 911 Now. Per note, pt declined to call EMS or go to ED. Stated she only wanted to see an MD for med change only. Patient is scheduled to see Jarold Motto today @ 3 pm. Please note, OV was made under impression that medication was main focus. Following scheduling pt displayed stronger emotional concerns to which she was then triaged. Please Advise.      Patient Name First: [facility] Last: Tissue Gender: Female DOB: [birth year] Age: 32 Y 5 M 27 D Return Phone Number: [phone] (Primary) Address: City/ State/ Zip: [place] [address] Client [facility] - Administrator, [facility] Provider Jacquiline Doe- MD Contact Type Call Who Is Calling Patient / Member / Family / Caregiver Call Type Triage / Clinical Relationship To Patient Self Return Phone Number [phone] (Primary) Chief Complaint SUICIDE - threatening harm to self or others Reason for Call Symptomatic / Request for Health Information Initial Comment Caller states her antidepressant Rx is not working and she is having thoughts of hurting herself. Translation No Nurse Assessment Nurse: Annye English, RN, Denise Date/Time (Eastern Time): 03/10/2023 11:54:51 AM Confirm and document reason for call. If symptomatic, describe symptoms. ---Pt antidepressant is not working and she has called suicide hotline for meds she can take. Does the patient have any new or worsening symptoms? ---Yes Will a triage be completed? ---Yes Related visit to physician within the last 2 weeks? ---No Does the PT have any chronic conditions? (i.e. diabetes, asthma, this includes High risk factors for pregnancy, etc.) ---No Is the patient pregnant or possibly pregnant? (Ask all females between the ages of 67-55) ---No Is this a behavioral health or substance abuse call? ---Yes Are you having any thoughts or feelings of harming or killing yourself or  someone else? ---Yes Do you have a weapon with you? ---No Are you alone? ---No Are you currently experiencing any physical discomfort that you think may be related to the use of alcohol or other drugs? (use substance abuse or alcohol abuse guidelines. These include withdrawal symptoms) ---No Do you worry that you may be hearing or seeing things that others do not? ---No Nurse Assessment Do you take medications for your condition(s)? ---Yes List medications here. ---Lexapro, Vivance Guidelines Guideline Title Affirmed Question Affirmed Notes Nurse Date/Time (Eastern Time) Suicide Concerns Patient is threatening suicide now [place], Charity fundraiser 03/10/2023 11:56:48 AM Disp. Time Lamount Cohen Time) Disposition Final User 03/10/2023 11:53:19 AM Send to Urgent Orlie Dakin 03/10/2023 11:58:28 AM Call EMS 911 Now Yes Carmon, RN, Angelique Blonder 03/10/2023 12:00:05 PM 911 Outcome Documentation Carmon, RN, Angelique Blonder Reason: Refused to dial 911 or be seen in ER. Insist on being seen in MDO. Trans to MDO per request. She is NOT alone at this time. Final Disposition 03/10/2023 11:58:28 AM Call EMS 911 Now Yes Carmon, RN, Leighton Ruff Disagree/Comply Disagree Caller Understands Yes PreDisposition Call Doctor Care Advice Given Per Guideline CALL EMS 911 NOW: * PATIENT IS WITH A RESPONSIBLE ADULT: If patient is with a responsible adult friend or family member, ask this person to CALL EMS 911 NOW. Ask the responsible adult to make sure patient doesn't have access to poisons, medications, knives, or guns. Ask the responsible adult to not leave the patient alone. NOTE TO TRIAGER - SUICIDAL IDEATION: CARE ADVICE given per Suicide Concerns (Adult) guideline.  Comments User: Greggory Stallion, RN Date/Time Lamount Cohen Time): 03/10/2023 11:58:22 AM Pt refused to be seen in ER or dial  911. Wants to see MD for med change only. Referrals GO TO FACILITY REFUSED

## 2023-03-10 NOTE — Telephone Encounter (Signed)
See Triage note 

## 2023-03-10 NOTE — Telephone Encounter (Signed)
FYI: This call has been transferred to triage nurse: Access Nurse. Once the result note has been entered staff can address the message at that time.  Patient called in with the following symptoms:  Red Word: Depressed mood; SI    Please advise at Mobile [phone] (mobile)  Message is routed to Provider Pool.

## 2023-03-12 ENCOUNTER — Ambulatory Visit (INDEPENDENT_AMBULATORY_CARE_PROVIDER_SITE_OTHER): Payer: BC Managed Care – PPO | Admitting: Family Medicine

## 2023-03-12 VITALS — BP 110/73 | HR 71 | Temp 98.2°F | Ht 65.0 in | Wt 259.4 lb

## 2023-03-12 DIAGNOSIS — F324 Major depressive disorder, single episode, in partial remission: Secondary | ICD-10-CM | POA: Diagnosis not present

## 2023-03-12 DIAGNOSIS — F3281 Premenstrual dysphoric disorder: Secondary | ICD-10-CM | POA: Diagnosis not present

## 2023-03-12 DIAGNOSIS — F419 Anxiety disorder, unspecified: Secondary | ICD-10-CM

## 2023-03-12 NOTE — Progress Notes (Signed)
Tammy Knight is a 32 y.o. female who presents today for an office visit.  Assessment/Plan:  Chronic Problems Addressed Today: Depression, major, single episode, in partial remission (HCC) Symptoms are not controlled. Had lengthy discussion with patient today regarding her symptoms and treatment plan. I think that she will do great with Wellbutrin however it is too early today to see much of an effect. She will continue this as well her Lexapro 20 mg daily. She is interested in coming off the Lexapro. Discussed with patient that at some point in the near future it would be reasonable to wean off this or trial alternative SSRI however given recent addition of Wellbutrin and uncontrolled symptoms we should continue with her current dose for now. She is agreeable to this.    She will follow up with [place] in a couple of weeks.  Depending on response to Wellbutrin would be reasonable to wean off or switch to Lexapro at that point.  She will reach out to [place] if she has any worsening symptoms including SI/HI.  No active SI or HI today.  She will be establishing with a therapist later today.  She likely does have underlying PMDD as well.  We did discuss role of OCPs however she would like to hold off on this for the time being.   We will write a note for her to be off of work for the next couple weeks and she will reach out to her HR department about getting FMLA paperwork completed.  PMDD (premenstrual dysphoric disorder) See depression A/P.  This is likely contributing to her recurrent mood disorders.  We did discuss OCPs however she would like to hold off on starting any hormones at this point.  This is reasonable.  She will follow-up with [place] in a couple weeks as above.  Anxiety disorder See depression A/P.  Her depression and anxiety are not well-controlled though just recently had Wellbutrin added on by PA a couple of days ago.  She will be seeing a therapist later today as well.  She  likely does have a PMDD component to all of this and hopefully her symptoms will improve over the next 1 to 2 weeks.  As above we will continue her Lexapro for now however we can discuss trial of alternative SSRI at her next office visit      Subjective:  HPI:  See assessment / plan for status of chronic conditions.  Patient is here today for follow-up.  I last saw her about 5 months ago.  At that time she was doing well on Vyvanse for her ADHD which we continued.  Unfortunately since our last visit she has had ongoing issues with worsening anxiety and depression as well as concern for PMDD.  She does note that her mood typically follows a cycle related to her menstrual cycle.  She does think that overall symptoms have been worsening for the last several months though this has been an ongoing issue for years.  She was seen here 2 days ago by different provider.  At that time they started Wellbutrin 150 mg daily.  She has taken 2 doses of this.  She has not noticed any side effects but also has not noticed any significant change with her mood.  Still has excessive feelings of being down, sad, and guilty.  She also does note increased irritability as well.  She has a lot of stress at home which is impacting her mood as well.  She has  discussed her situation with work and they have recommended that she take some time off to improve her health.  She has already reached out to schedule an appointment with a therapist and will be seeing 1 later today.  She is open to medication changes as well however is not interested in trying any hormonal medications at this point.  She would like to come off of the Lexapro at some point as she is not sure if it is being effective.     03/12/2023    8:16 AM 03/10/2023    3:01 PM 09/21/2022    7:42 AM  Depression screen PHQ 2/9  Decreased Interest 2 2 0  Down, Depressed, Hopeless 2 2 0  PHQ - 2 Score 4 4 0  Altered sleeping 2 2 0  Tired, decreased energy 2 2 0   Change in appetite 2 3 0  Feeling bad or failure about yourself  2 2 0  Trouble concentrating 2 2 0  Moving slowly or fidgety/restless 1 2 0  Suicidal thoughts 0 2 0  PHQ-9 Score 15 19 0  Difficult doing work/chores Very difficult Very difficult Not difficult at all         Objective:  Physical Exam: BP 110/73   Pulse 71   Temp 98.2 F (36.8 C) (Temporal)   Ht 5\' 5"  (1.651 m)   Wt 259 lb 6.4 oz (117.7 kg)   LMP 02/20/2023 (Exact Date)   SpO2 99%   BMI 43.17 kg/m   Gen: No acute distress, resting comfortably Neuro: Grossly normal, moves all extremities Psych: Normal affect and thought content  Time Spent: 55 minutes of total time was spent on the date of the encounter performing the following actions: chart review prior to seeing the patient, obtaining history, performing a medically necessary exam, counseling on the treatment plan, placing orders, and documenting in our EHR.        Katina Degree. Jimmey Ralph, MD 03/12/2023 12:13 PM

## 2023-03-12 NOTE — Assessment & Plan Note (Signed)
See depression A/P.  Her depression and anxiety are not well-controlled though just recently had Wellbutrin added on by PA a couple of days ago.  She will be seeing a therapist later today as well.  She likely does have a PMDD component to all of this and hopefully her symptoms will improve over the next 1 to 2 weeks.  As above we will continue her Lexapro for now however we can discuss trial of alternative SSRI at her next office visit

## 2023-03-12 NOTE — Assessment & Plan Note (Addendum)
Symptoms are not controlled. Had lengthy discussion with patient today regarding her symptoms and treatment plan. I think that she will do great with Wellbutrin however it is too early today to see much of an effect. She will continue this as well her Lexapro 20 mg daily. She is interested in coming off the Lexapro. Discussed with patient that at some point in the near future it would be reasonable to wean off this or trial alternative SSRI however given recent addition of Wellbutrin and uncontrolled symptoms we should continue with her current dose for now. She is agreeable to this.    She will follow up with [place] in a couple of weeks.  Depending on response to Wellbutrin would be reasonable to wean off or switch to Lexapro at that point.  She will reach out to [place] if she has any worsening symptoms including SI/HI.  No active SI or HI today.  She will be establishing with a therapist later today.  She likely does have underlying PMDD as well.  We did discuss role of OCPs however she would like to hold off on this for the time being.   We will write a note for her to be off of work for the next couple weeks and she will reach out to her HR department about getting FMLA paperwork completed.

## 2023-03-12 NOTE — Patient Instructions (Signed)
It was very nice to see you today!  Things are going to get better!  Please continue your current medications.  Follow up with [place] in 2 weeks.   Return in about 2 weeks (around 03/26/2023).   Take care, Dr Jimmey Ralph  PLEASE NOTE:  If you had any lab tests, please let us know if you have not heard back within a few days. You may see your results on mychart before we have a chance to review them but we will give you a call once they are reviewed by [place].   If we ordered any referrals today, please let us know if you have not heard from their office within the next week.   If you had any urgent prescriptions sent in today, please check with the pharmacy within an hour of our visit to make sure the prescription was transmitted appropriately.   Please try these tips to maintain a healthy lifestyle:  Eat at least 3 REAL meals and 1-2 snacks per day.  Aim for no more than 5 hours between eating.  If you eat breakfast, please do so within one hour of getting up.   Each meal should contain half fruits/vegetables, one quarter protein, and one quarter carbs (no bigger than a computer mouse)  Cut down on sweet beverages. This includes juice, soda, and sweet tea.   Drink at least 1 glass of water with each meal and aim for at least 8 glasses per day  Exercise at least 150 minutes every week.

## 2023-03-12 NOTE — Assessment & Plan Note (Signed)
See depression A/P.  This is likely contributing to her recurrent mood disorders.  We did discuss OCPs however she would like to hold off on starting any hormones at this point.  This is reasonable.  She will follow-up with [place] in a couple weeks as above.

## 2023-03-15 ENCOUNTER — Encounter: Payer: Self-pay | Admitting: Family Medicine

## 2023-03-15 NOTE — Telephone Encounter (Signed)
See note  FMLA printed and placed to be reviewed at PCP's office

## 2023-03-16 ENCOUNTER — Telehealth: Payer: Self-pay | Admitting: Family Medicine

## 2023-03-16 ENCOUNTER — Other Ambulatory Visit: Payer: Self-pay | Admitting: Family Medicine

## 2023-03-16 MED ORDER — LISDEXAMFETAMINE DIMESYLATE 20 MG PO CAPS: 20.0000 mg | ORAL_CAPSULE | Freq: Every day | ORAL | 0 refills | Status: AC

## 2023-03-16 NOTE — Telephone Encounter (Signed)
Patient called stating she accidentally made two claims and stated she is going to make sure that she sent the right form to PCP. States claim number should be [id number]. States that she will be double checking on her part and if needed will send the correct FMLA claim paperwork.    Patient also stated that her job was able to work with her in terms of her being out of work. States that instead of being out for 2 weeks as originally written by her PCP, her job states she can work from home x 2 weeks. States she would just need revised letter from PCP stating it's okay for her to work from home x 2 weeks.

## 2023-03-16 NOTE — Telephone Encounter (Signed)
Error

## 2023-03-22 ENCOUNTER — Encounter: Payer: Self-pay | Admitting: Family Medicine

## 2023-03-22 ENCOUNTER — Ambulatory Visit (INDEPENDENT_AMBULATORY_CARE_PROVIDER_SITE_OTHER): Payer: BC Managed Care – PPO | Admitting: Family Medicine

## 2023-03-22 VITALS — BP 106/70 | HR 75 | Temp 97.7°F | Ht 65.0 in | Wt 262.0 lb

## 2023-03-22 DIAGNOSIS — F419 Anxiety disorder, unspecified: Secondary | ICD-10-CM | POA: Diagnosis not present

## 2023-03-22 DIAGNOSIS — F324 Major depressive disorder, single episode, in partial remission: Secondary | ICD-10-CM | POA: Diagnosis not present

## 2023-03-22 DIAGNOSIS — F3281 Premenstrual dysphoric disorder: Secondary | ICD-10-CM | POA: Diagnosis not present

## 2023-03-22 NOTE — Telephone Encounter (Signed)
Patient had an office visit with PCP today

## 2023-03-22 NOTE — Assessment & Plan Note (Signed)
See depression A/P.  Symptoms have modestly improved since her last visit.  She will be starting with a therapist soon.  We are increasing her Wellbutrin to 300 mg daily and continue Lexapro 20 mg daily.

## 2023-03-22 NOTE — Assessment & Plan Note (Addendum)
Overall symptoms have stabilized since her last visit and she has had modest improvement.  She is having mild nausea with Wellbutrin however this is manageable.  She will be starting with a therapist via work soon.  We did discuss it would be several weeks before she gets to the maximal effect on Wellbutrin however it would be reasonable for [place] to increase the dose today as she seems to be tolerating overall well.  We will increase her Wellbutrin to 300 mg daily.  If she has significant nausea with this we can go back to 150 mg daily.  She will continue current dose of Lexapro 20 mg daily.  She will send me a message in a few weeks via MyChart to let us know how she is doing with the new regimen.  She will come back to see me in about 4 weeks for in person office visit.  If she is doing well we can work on weaning down or discontinue Lexapro as she would like to come off this medication at some point.  We did discuss reasons to return to care and seek emergent care.  FMLA paperwork was completed today and she is looking into short term disability.  We will complete any short-term disability paperwork as needed.

## 2023-03-22 NOTE — Progress Notes (Signed)
Tammy Knight is a 32 y.o. female who presents today for an office visit.  Assessment/Plan:  Chronic Problems Addressed Today: Depression, major, single episode, in partial remission (HCC) Overall symptoms have stabilized since her last visit and she has had modest improvement.  She is having mild nausea with Wellbutrin however this is manageable.  She will be starting with a therapist via work soon.  We did discuss it would be several weeks before she gets to the maximal effect on Wellbutrin however it would be reasonable for [place] to increase the dose today as she seems to be tolerating overall well.  We will increase her Wellbutrin to 300 mg daily.  If she has significant nausea with this we can go back to 150 mg daily.  She will continue current dose of Lexapro 20 mg daily.  She will send me a message in a few weeks via MyChart to let us know how she is doing with the new regimen.  She will come back to see me in about 4 weeks for in person office visit.  If she is doing well we can work on weaning down or discontinue Lexapro as she would like to come off this medication at some point.  We did discuss reasons to return to care and seek emergent care.  FMLA paperwork was completed today and she is looking into short term disability.  We will complete any short-term disability paperwork as needed.  Anxiety disorder See depression A/P.  Symptoms have modestly improved since her last visit.  She will be starting with a therapist soon.  We are increasing her Wellbutrin to 300 mg daily and continue Lexapro 20 mg daily.  PMDD (premenstrual dysphoric disorder) See depression A/P.  She will be starting with a therapist soon.  We will increase Wellbutrin to 300 mg daily and continue Lexapro 20 mg daily.     Subjective:  HPI:  See A/P for status of chronic conditions.  Patient is here today for follow-up.  We last saw her about a week and a half ago.  At that time we had lengthy discussion  regarding her PMDD and depression.  We continued her on Wellbutrin 150 mg daily and Lexapro 20 mg daily.  We did recommend that she take some time off to work.  She is here today for [facility] paperwork completion.  She does feel like she is in a better place than she was 2 weeks ago however is still having significant symptoms. She is working with her HR department and will be starting therapy soon.  She did have mild nausea with her Wellbutrin but this is manageable.  She is still interested in decreasing her Lexapro at some point however is agreeable to stay on current dose for now.     03/22/2023    8:29 AM 03/12/2023    8:16 AM 03/10/2023    3:01 PM 09/21/2022    7:42 AM 09/21/2022    7:36 AM  Depression screen PHQ 2/9  Decreased Interest 2 2 2  0 0  Down, Depressed, Hopeless 2 2 2  0 0  PHQ - 2 Score 4 4 4  0 0  Altered sleeping 2 2 2  0   Tired, decreased energy 1 2 2  0   Change in appetite 3 2 3  0   Feeling bad or failure about yourself  2 2 2  0   Trouble concentrating 1 2 2  0   Moving slowly or fidgety/restless 2 1 2  0   Suicidal  thoughts 0 0 2 0   PHQ-9 Score 15 15 19  0   Difficult doing work/chores Somewhat difficult Very difficult Very difficult Not difficult at all           Objective:  Physical Exam: BP 106/70   Pulse 75   Temp 97.7 F (36.5 C) (Temporal)   Ht 5\' 5"  (1.651 m)   Wt 262 lb (118.8 kg)   LMP 03/22/2023 (Exact Date)   SpO2 96%   BMI 43.60 kg/m   Gen: No acute distress, resting comfortably Neuro: Grossly normal, moves all extremities Psych: Normal affect and thought content. No SI or HI.       Katina Degree. Jimmey Ralph, MD 03/22/2023 8:56 AM

## 2023-03-22 NOTE — Patient Instructions (Signed)
It was very nice to see you today!  Please increase your Wellbutrin to 300 mg daily.  You can double up the current dose.  Send a message in a few weeks let me know how you are doing.  I would like to see you back before you go back to work later this month.  Return in about 4 weeks (around 04/19/2023).   Take care, Dr Jimmey Ralph  PLEASE NOTE:  If you had any lab tests, please let us know if you have not heard back within a few days. You may see your results on mychart before we have a chance to review them but we will give you a call once they are reviewed by [place].   If we ordered any referrals today, please let us know if you have not heard from their office within the next week.   If you had any urgent prescriptions sent in today, please check with the pharmacy within an hour of our visit to make sure the prescription was transmitted appropriately.   Please try these tips to maintain a healthy lifestyle:  Eat at least 3 REAL meals and 1-2 snacks per day.  Aim for no more than 5 hours between eating.  If you eat breakfast, please do so within one hour of getting up.   Each meal should contain half fruits/vegetables, one quarter protein, and one quarter carbs (no bigger than a computer mouse)  Cut down on sweet beverages. This includes juice, soda, and sweet tea.   Drink at least 1 glass of water with each meal and aim for at least 8 glasses per day  Exercise at least 150 minutes every week.

## 2023-03-22 NOTE — Assessment & Plan Note (Signed)
See depression A/P.  She will be starting with a therapist soon.  We will increase Wellbutrin to 300 mg daily and continue Lexapro 20 mg daily.

## 2023-03-23 ENCOUNTER — Ambulatory Visit: Payer: BC Managed Care – PPO | Admitting: Family Medicine

## 2023-03-23 ENCOUNTER — Telehealth: Payer: Self-pay | Admitting: *Deleted

## 2023-03-23 NOTE — Telephone Encounter (Signed)
FMLA faxed to [phone] Copy placed to be scan in patient chart  Original given to patient on office visit

## 2023-03-25 NOTE — Telephone Encounter (Signed)
Received another request for these forms . Placed in providers box for review

## 2023-03-26 NOTE — Telephone Encounter (Signed)
Tammy Knight from [facility] [employer] called to confirm if request for forms had been received. I informed caller that these have been received and could take 3-5 business days before completed. Caller verbalized understanding and asked them to be faxed to [phone] when completed.

## 2023-03-29 ENCOUNTER — Ambulatory Visit: Payer: BC Managed Care – PPO | Admitting: Family Medicine

## 2023-03-31 ENCOUNTER — Ambulatory Visit (INDEPENDENT_AMBULATORY_CARE_PROVIDER_SITE_OTHER): Payer: BC Managed Care – PPO | Admitting: Family Medicine

## 2023-03-31 ENCOUNTER — Encounter: Payer: Self-pay | Admitting: Family Medicine

## 2023-03-31 VITALS — BP 122/80 | HR 93 | Temp 99.7°F | Ht 65.0 in | Wt 266.8 lb

## 2023-03-31 DIAGNOSIS — F324 Major depressive disorder, single episode, in partial remission: Secondary | ICD-10-CM | POA: Diagnosis not present

## 2023-03-31 DIAGNOSIS — F419 Anxiety disorder, unspecified: Secondary | ICD-10-CM

## 2023-03-31 MED ORDER — BUPROPION HCL ER (XL) 150 MG PO TB24: 300.0000 mg | ORAL_TABLET | Freq: Every day | ORAL | Status: DC

## 2023-03-31 NOTE — Patient Instructions (Signed)
It was very nice to see you today!  I am glad that you are feeling better!  We can continue your current medications for now.  Let me know in a month or so how you are doing.  Return if symptoms worsen or fail to improve.   Take care, Dr Jimmey Ralph  PLEASE NOTE:  If you had any lab tests, please let us know if you have not heard back within a few days. You may see your results on mychart before we have a chance to review them but we will give you a call once they are reviewed by [place].   If we ordered any referrals today, please let us know if you have not heard from their office within the next week.   If you had any urgent prescriptions sent in today, please check with the pharmacy within an hour of our visit to make sure the prescription was transmitted appropriately.   Please try these tips to maintain a healthy lifestyle:  Eat at least 3 REAL meals and 1-2 snacks per day.  Aim for no more than 5 hours between eating.  If you eat breakfast, please do so within one hour of getting up.   Each meal should contain half fruits/vegetables, one quarter protein, and one quarter carbs (no bigger than a computer mouse)  Cut down on sweet beverages. This includes juice, soda, and sweet tea.   Drink at least 1 glass of water with each meal and aim for at least 8 glasses per day  Exercise at least 150 minutes every week.

## 2023-03-31 NOTE — Assessment & Plan Note (Addendum)
See depression A/P.  Symptoms have improved significantly over the last week and a half.  We will continue her Lexapro 20 mg daily with Wellbutrin 300 mg daily and she will continue working with a therapist.

## 2023-03-31 NOTE — Progress Notes (Signed)
   Tammy Knight is a 32 y.o. female who presents today for an office visit.  Assessment/Plan:  Chronic Problems Addressed Today: Depression, major, single episode, in partial remission (HCC) Symptoms are much better controlled today.  PHQ score 0.  She is working with therapy.  We will continue her Wellbutrin 300 mg daily and Lexapro 20 mg daily.  At some point she would like to come off the Lexapro however we will continue this for now.  We can readdress this at some point in the future.  She will follow-up with [place] in about a month or so though she will return to care sooner if she has any worsening symptoms or intolerable side effects.  Anxiety disorder See depression A/P.  Symptoms have improved significantly over the last week and a half.  We will continue her Lexapro 20 mg daily with Wellbutrin 300 mg daily and she will continue working with a therapist.     Subjective:  HPI:  See A/P for status of chronic conditions.  Patient is here today for follow-up.  We saw her 9 days ago for depression and PMDD.At that time, we increased her Wellbutrin to 300 mg daily and continue Lexapro 20 mg daily.  She has been done well with this.  She is also working with therapist and thinks that this is going well.  She did have some mild nausea with Wellbutrin however this is manageable.     03/31/2023    8:28 AM 03/22/2023    8:29 AM 03/12/2023    8:16 AM 03/10/2023    3:01 PM 09/21/2022    7:42 AM  Depression screen PHQ 2/9  Decreased Interest 0 2 2 2  0  Down, Depressed, Hopeless 0 2 2 2  0  PHQ - 2 Score 0 4 4 4  0  Altered sleeping 0 2 2 2  0  Tired, decreased energy 0 1 2 2  0  Change in appetite 0 3 2 3  0  Feeling bad or failure about yourself  0 2 2 2  0  Trouble concentrating 0 1 2 2  0  Moving slowly or fidgety/restless 0 2 1 2  0  Suicidal thoughts 0 0 0 2 0  PHQ-9 Score 0 15 15 19  0  Difficult doing work/chores Not difficult at all Somewhat difficult Very difficult Very  difficult Not difficult at all          Objective:  Physical Exam: BP 122/80   Pulse 93   Temp 99.7 F (37.6 C) (Temporal)   Ht 5\' 5"  (1.651 m)   Wt 266 lb 12.8 oz (121 kg)   LMP 03/22/2023 (Exact Date)   SpO2 96%   BMI 44.40 kg/m   Gen: No acute distress, resting comfortably Neuro: Grossly normal, moves all extremities Psych: Normal affect and thought content      Amirra Herling M. Jimmey Ralph, MD 03/31/2023 9:06 AM

## 2023-03-31 NOTE — Telephone Encounter (Signed)
Placed at PCP office to be reviewed  

## 2023-03-31 NOTE — Assessment & Plan Note (Signed)
Symptoms are much better controlled today.  PHQ score 0.  She is working with therapy.  We will continue her Wellbutrin 300 mg daily and Lexapro 20 mg daily.  At some point she would like to come off the Lexapro however we will continue this for now.  We can readdress this at some point in the future.  She will follow-up with [place] in about a month or so though she will return to care sooner if she has any worsening symptoms or intolerable side effects.

## 2023-04-05 NOTE — Telephone Encounter (Signed)
Pt needs the bottom form to be faxed back ASAP for her disability. Please advise.

## 2023-04-08 ENCOUNTER — Encounter: Payer: Self-pay | Admitting: Family Medicine

## 2023-04-09 ENCOUNTER — Telehealth: Payer: Self-pay | Admitting: Family Medicine

## 2023-04-09 NOTE — Telephone Encounter (Signed)
Copied from CRM [id number]. Topic: General - Other >> Apr 09, 2023 12:44 PM Sonny Dandy B wrote: Reason for CRM: pt called regarding disability papers she gave to her provider, would like to know if they have been completed and sent. Pt is requesting a call back. [phone]

## 2023-04-12 ENCOUNTER — Telehealth: Payer: Self-pay | Admitting: Family Medicine

## 2023-04-12 NOTE — Telephone Encounter (Signed)
[facility] faxed over FMLA  document, to be filled out by provider. Patient requested to send it back via Fax within ASAP. Document is located in providers tray at front office.Please advise at Mobile [phone] (mobile)

## 2023-04-12 NOTE — Telephone Encounter (Signed)
FMLA was faxed on 04/09/2023 to [phone] form placed to be scan in patient chart Copy Mail to patient

## 2023-04-13 NOTE — Telephone Encounter (Signed)
Form placed to be reviewed by PCP in his office

## 2023-04-19 ENCOUNTER — Ambulatory Visit: Payer: BC Managed Care – PPO | Admitting: Family Medicine

## 2023-04-19 ENCOUNTER — Telehealth: Payer: Self-pay | Admitting: *Deleted

## 2023-04-19 NOTE — Telephone Encounter (Signed)
Copied from CRM [id number]. Topic: General - Other >> Apr 19, 2023 10:17 AM Truddie Crumble wrote: Reason for CRM: pt called stating the reason why she could not come to her appointment today was because her mom is in the ICU. The pt mom fractured her neck and the pt mom has spinal cancer from her neck to her back   Noted  [facility]

## 2023-05-03 NOTE — Telephone Encounter (Signed)
 Spoke with patient notified records was send two weeks ago  Patient verbalized understanding

## 2023-05-03 NOTE — Telephone Encounter (Signed)
 Copied from CRM [id number]. Topic: Clinical - Medical Advice >> May 03, 2023 12:37 PM Thersia BROCKS wrote: Reason for CRM: Patient called in stating the status of the Short Term Disability forms, she is requesting if a nurse, could give her callback with any information regarding her short term disability forms and if any additonal information is needed for the patient

## 2023-05-06 ENCOUNTER — Encounter: Payer: Self-pay | Admitting: Family Medicine

## 2023-05-06 ENCOUNTER — Telehealth (INDEPENDENT_AMBULATORY_CARE_PROVIDER_SITE_OTHER): Payer: BC Managed Care – PPO | Admitting: Family Medicine

## 2023-05-06 VITALS — Ht 65.0 in | Wt 266.0 lb

## 2023-05-06 DIAGNOSIS — F324 Major depressive disorder, single episode, in partial remission: Secondary | ICD-10-CM

## 2023-05-06 DIAGNOSIS — F909 Attention-deficit hyperactivity disorder, unspecified type: Secondary | ICD-10-CM

## 2023-05-06 DIAGNOSIS — F419 Anxiety disorder, unspecified: Secondary | ICD-10-CM

## 2023-05-06 MED ORDER — LISDEXAMFETAMINE DIMESYLATE 30 MG PO CAPS: 30.0000 mg | ORAL_CAPSULE | Freq: Every day | ORAL | 0 refills | Status: DC

## 2023-05-06 MED ORDER — ESCITALOPRAM OXALATE 10 MG PO TABS: 10.0000 mg | ORAL_TABLET | Freq: Every day | ORAL | 0 refills | Status: DC

## 2023-05-06 NOTE — Assessment & Plan Note (Signed)
Database without red flags.  She does have likely audience has been effective however is interested in increasing the dose.  Medications do help her with staying focused and on task and give her more motivation.  We will increase to 30 mg daily.  She will follow-up with [place] in a few weeks as above and we can adjust the medication dose as tolerated.

## 2023-05-06 NOTE — Assessment & Plan Note (Addendum)
Her symptoms are poorly controlled.  She has been under a lot of stress recently due to her own depression and anxiety as well as her mother being critically ill and passing away.  She would like to stop Lexapro as she does not think it is working for her any longer.  Denies any SI or HI today.  She would like to be off any anxiety depression medications to see how she feels off of them.  She will go to Lexapro 10 mg daily for a couple of weeks and then 10 mg every other day for a week and then discontinue.  Did strongly encourage patient to follow-up with me in a couple of weeks to let us know how she is doing with the wean down.  She will let us know if she has any worsening depressive symptoms and especially any SI, SIB, or HI.  She was referred to a therapist but at this point, she does not think that this would be beneficial and does not wish to pursue this any further. She also does not think that a "pill" will help her symptoms.  We did discuss that there is a trial and error process until we find the right combination of medications that worked well for her however she is frustrated by this process and does want to feel like an "experiment." She is very frustrated by the medical system and is reluctant to pursue any further treatments for this from traditional medical field at this point.  I acknowledged her frustration with this process especially in light of her recent stressors, however did reiterate that symptoms can improve with the right combination of medications. She is interested in looking at an integrative clinic to see if there are any other potential causes for her ongoing depression and anxiety.  Advised patient that I do not have any specific recommendations for this.  She will check in with [place] in a couple weeks or sooner if any new issues arise.  We could consider trial of alternative SSRI or other medication.  Will also continue to offer referral to therapy.  We discussed reasons to  return to care and seek care sooner.

## 2023-05-06 NOTE — Assessment & Plan Note (Signed)
See depression A/P.  Symptoms are not well-controlled.  Mild on Wellbutrin and she is weaning down on Lexapro as above.  Does not want to see a therapist at this point.

## 2023-05-06 NOTE — Progress Notes (Signed)
Tammy Knight is a 33 y.o. female who presents today for a virtual office visit.  Assessment/Plan:  Chronic Problems Addressed Today: Depression, major, single episode, in partial remission (HCC) Her symptoms are poorly controlled.  She has been under a lot of stress recently due to her own depression and anxiety as well as her mother being critically ill and passing away.  She would like to stop Lexapro as she does not think it is working for her any longer.  Denies any SI or HI today.  She would like to be off any anxiety depression medications to see how she feels off of them.  She will go to Lexapro 10 mg daily for a couple of weeks and then 10 mg every other day for a week and then discontinue.  Did strongly encourage patient to follow-up with me in a couple of weeks to let us know how she is doing with the wean down.  She will let us know if she has any worsening depressive symptoms and especially any SI, SIB, or HI.  She was referred to a therapist but at this point, she does not think that this would be beneficial and does not wish to pursue this any further. She also does not think that a "pill" will help her symptoms.  We did discuss that there is a trial and error process until we find the right combination of medications that worked well for her however she is frustrated by this process and does want to feel like an "experiment." She is very frustrated by the medical system and is reluctant to pursue any further treatments for this from traditional medical field at this point.  I acknowledged her frustration with this process especially in light of her recent stressors, however did reiterate that symptoms can improve with the right combination of medications. She is interested in looking at an integrative clinic to see if there are any other potential causes for her ongoing depression and anxiety.  Advised patient that I do not have any specific recommendations for this.  She  will check in with [place] in a couple weeks or sooner if any new issues arise.  We could consider trial of alternative SSRI or other medication.  Will also continue to offer referral to therapy.  We discussed reasons to return to care and seek care sooner.    Anxiety disorder See depression A/P.  Symptoms are not well-controlled.  Mild on Wellbutrin and she is weaning down on Lexapro as above.  Does not want to see a therapist at this point.  ADHD Database without red flags.  She does have likely audience has been effective however is interested in increasing the dose.  Medications do help her with staying focused and on task and give her more motivation.  We will increase to 30 mg daily.  She will follow-up with [place] in a few weeks as above and we can adjust the medication dose as tolerated.     Subjective:  HPI:  See Assessment / plan for status of chronic conditions.  Patient is here for follow up. We last saw her about a month ago. At that time she was doing well with a combination of Lexapro 20 mg daily and Wellbutrin 300 mg daily.  However that time her mother was critically ill and passed away a couple of weeks ago.  She has been under a lot of stress.  Feeling more down and depressed.  She was concerned that the Wellbutrin  was causing migraines and she discontinued this recently.  Overall today she does not feel well in regards to her mood.  She is working on getting short-term disability through her work however there have been issues and this is still a work in progress.  She would like to stop Lexapro at this point.  She does not think that it has been effective.  She has been on this for a few years and would like to see what she feels like off the medication.  She is very frustrated with her relapsing depression and anxiety over the last several months.  She believes that she may have a hormone imbalance however she did have levels checked a while ago which were normal.  She is interested in  seeing a holistic physician for this.  We did refer her to see a therapist however this was deferred due to recent issues with her mom passing away.  She does think the Vyvanse has been effective however is interested in increasing the dose.  She has not had any significant side effects with the Vyvanse.        Objective/Observations  Physical Exam: Gen: NAD, resting comfortably Pulm: Normal work of breathing Neuro: Grossly normal, moves all extremities Psych: Normal affect and thought content  Virtual Visit via Video   I connected with Santina Evans on 05/06/23 at 10:20 AM EST by a video enabled telemedicine application and verified that I am speaking with the correct person using two identifiers. The limitations of evaluation and management by telemedicine and the availability of in person appointments were discussed. The patient expressed understanding and agreed to proceed.   Patient location: Home Provider location: [place] [employer] Persons participating in the virtual visit: Myself and Patient     Katina Degree. Jimmey Ralph, MD 05/06/2023 11:07 AM

## 2023-05-07 NOTE — Telephone Encounter (Signed)
Call [phone] was transfer to a voice mail  Left voice mail, no form received, our fax #[phone] Please refax form

## 2023-05-10 NOTE — Telephone Encounter (Signed)
Copied from CRM [id number]. Topic: General - Other >> May 10, 2023 10:05 AM Hector Shade B wrote: Reason for CRM: Patient [phone] please return call to patient in reference to disability paperwork, she has requested to speak with Francena Hanly

## 2023-05-10 NOTE — Telephone Encounter (Signed)
Spoke with patient, stated FMLA faxed form on Friday  No Form received patient notified

## 2023-05-11 ENCOUNTER — Telehealth: Payer: Self-pay | Admitting: Family Medicine

## 2023-05-11 NOTE — Telephone Encounter (Signed)
Copied from CRM [id number]. Topic: General - Other >> May 11, 2023 12:11 PM [place] A wrote: Reason for CRM: Patient called to speak with "Francena Hanly" to follow-up with documents regarding her disability

## 2023-05-12 ENCOUNTER — Encounter: Payer: Self-pay | Admitting: Family Medicine

## 2023-05-12 ENCOUNTER — Encounter: Payer: Self-pay | Admitting: Physician Assistant

## 2023-05-12 NOTE — Telephone Encounter (Signed)
Form not received.

## 2023-05-17 NOTE — Telephone Encounter (Signed)
Left message to return call to our office at their convenience.  We have not received any form for your FMLA

## 2023-05-18 NOTE — Telephone Encounter (Signed)
Call [phone] Spoke with Al, transfer call to Newman Nickels  Left voice massage if is possible to refaxed information to our back fax number [phone]

## 2023-05-18 NOTE — Telephone Encounter (Signed)
Spoke with patient stated case manager faxed forms, form not recived  Wil lcall [employer]  At [phone] ID [id number]

## 2023-05-19 ENCOUNTER — Encounter: Payer: Self-pay | Admitting: Family Medicine

## 2023-05-19 NOTE — Telephone Encounter (Signed)
Form Faxed to [phone]

## 2023-05-19 NOTE — Telephone Encounter (Signed)
Patient notified form faxed this morning

## 2023-05-20 NOTE — Telephone Encounter (Signed)
Form printed placed in PCP office to be reviewed

## 2023-05-20 NOTE — Telephone Encounter (Signed)
Spoke with patient, notified email was send today with forms

## 2023-05-25 NOTE — Telephone Encounter (Signed)
[employer] form faxed to [phone] Form place to be scan in pt chart  Mail copy to patient at address provider

## 2023-05-31 ENCOUNTER — Telehealth: Payer: Self-pay | Admitting: *Deleted

## 2023-05-31 NOTE — Telephone Encounter (Signed)
 Form placed on PCP office

## 2023-05-31 NOTE — Telephone Encounter (Signed)
 FMLA Faxed to [phone]

## 2023-05-31 NOTE — Telephone Encounter (Signed)
 Copied from CRM [id number]. Topic: General - Other >> May 31, 2023 10:31 AM Jayson Michael wrote: Reason for CRM: patient called in regards to to her short term disability request, she stated that the form Reatha Sur asked about on Friday does in fact need to be filled out and to call her back if there are anymore questions

## 2023-05-31 NOTE — Telephone Encounter (Signed)
 Patient notified FMLA was faxed today

## 2023-06-25 ENCOUNTER — Ambulatory Visit (INDEPENDENT_AMBULATORY_CARE_PROVIDER_SITE_OTHER): Payer: BC Managed Care – PPO | Admitting: Podiatry

## 2023-06-25 DIAGNOSIS — M722 Plantar fascial fibromatosis: Secondary | ICD-10-CM

## 2023-06-25 NOTE — Progress Notes (Signed)
  Subjective:  Patient ID: Tammy Knight, female    DOB: 19-Oct-1990,  MRN: [id number]  Chief Complaint  Patient presents with   Plantar Fasciitis    Bilateral foot pain pt stated that she has discomfort in the heels     33 y.o. female presents with the above complaint.  Patient presents with complaint of bilateral heel pain that has been going for quite some time is progressive gotten worse worse with ambulation worse with pressure pain scale 7 out of 10 dull aching nature the  Past Medical History:  Diagnosis Date   Asthma    h/o no inhalers   Chlamydia    Depression    Hay fever    UTI (urinary tract infection)     Current Outpatient Medications:    doxycycline (ADOXA) 100 MG tablet, Take 100 mg by mouth 2 (two) times daily., Disp: , Rfl:    escitalopram (LEXAPRO) 10 MG tablet, Take 1 tablet (10 mg total) by mouth daily., Disp: 30 tablet, Rfl: 0   lisdexamfetamine (VYVANSE) 20 MG capsule, Take 1 capsule (20 mg total) by mouth daily., Disp: 30 capsule, Rfl: 0   lisdexamfetamine (VYVANSE) 30 MG capsule, Take 1 capsule (30 mg total) by mouth daily., Disp: 30 capsule, Rfl: 0  Social History   Tobacco Use  Smoking Status Former   Current packs/day: 0.00   Types: Cigarettes   Start date: 01/07/2013   Quit date: 01/07/2018   Years since quitting: 5.4  Smokeless Tobacco Never  Tobacco Comments   1-2 cig per day    Allergies  Allergen Reactions   Penicillins Anaphylaxis   Bee Venom Swelling   Objective:  There were no vitals filed for this visit. There is no height or weight on file to calculate BMI. Constitutional Well developed. Well nourished.  Vascular Dorsalis pedis pulses palpable bilaterally. Posterior tibial pulses palpable bilaterally. Capillary refill normal to all digits.  No cyanosis or clubbing noted. Pedal hair growth normal.  Neurologic Normal speech. Oriented to person, place, and time. Epicritic sensation to light touch grossly present  bilaterally.  Dermatologic Nails well groomed and normal in appearance. No open wounds. No skin lesions.  Orthopedic: Normal joint ROM without pain or crepitus bilaterally. No visible deformities. Tender to palpation at the calcaneal tuber bilaterally. No pain with calcaneal squeeze bilaterally. Ankle ROM diminished range of motion bilaterally. Silfverskiold Test: positive bilaterally.   Radiographs: None  Assessment:   1. Plantar fasciitis, right   2. Plantar fasciitis, left    Plan:  Patient was evaluated and treated and all questions answered.  Plantar Fasciitis, bilaterally - XR reviewed as above.  - Educated on icing and stretching. Instructions given.  - Injection delivered to the plantar fascia as below. - DME: Plantar fascial brace dispensed to support the medial longitudinal arch of the foot and offload pressure from the heel and prevent arch collapse during weightbearing - Pharmacologic management: None  Procedure: Injection Tendon/Ligament Location: Bilateral plantar fascia at the glabrous junction; medial approach. Skin Prep: alcohol Injectate: 0.5 cc 0.5% marcaine plain, 0.5 cc of 1% Lidocaine, 0.5 cc kenalog 10. Disposition: Patient tolerated procedure well. Injection site dressed with a band-aid.  No follow-ups on file.

## 2023-07-01 ENCOUNTER — Other Ambulatory Visit: Payer: Self-pay | Admitting: Family Medicine

## 2023-07-01 NOTE — Telephone Encounter (Signed)
 Last OV: 03/31/23  Next OV: none  Last Filled: 05/06/23  Quantity: 30

## 2023-07-02 MED ORDER — LISDEXAMFETAMINE DIMESYLATE 30 MG PO CAPS: 30.0000 mg | ORAL_CAPSULE | Freq: Every day | ORAL | 0 refills | Status: AC

## 2023-07-05 ENCOUNTER — Encounter: Payer: Self-pay | Admitting: Family Medicine

## 2023-07-05 NOTE — Telephone Encounter (Signed)
 An endocrinologist is more for issues with client such as thyroid, ovaries, pancreas, adrenal glands, etc.  They do not do much with the lymphatic system.  If she is worried about hormone imbalance then we can refer her to endocrinology.  Recommend office visit her if she has further questions.

## 2023-07-05 NOTE — Telephone Encounter (Signed)
**Note De-identified  [place] Obfuscation** Please advise 

## 2023-07-23 ENCOUNTER — Ambulatory Visit (INDEPENDENT_AMBULATORY_CARE_PROVIDER_SITE_OTHER): Admitting: Podiatry

## 2023-07-23 ENCOUNTER — Encounter: Payer: Self-pay | Admitting: Podiatry

## 2023-07-23 DIAGNOSIS — M722 Plantar fascial fibromatosis: Secondary | ICD-10-CM | POA: Diagnosis not present

## 2023-07-23 DIAGNOSIS — Q666 Other congenital valgus deformities of feet: Secondary | ICD-10-CM | POA: Diagnosis not present

## 2023-07-23 DIAGNOSIS — Z8709 Personal history of other diseases of the respiratory system: Secondary | ICD-10-CM | POA: Insufficient documentation

## 2023-07-23 NOTE — Progress Notes (Signed)
 Subjective:  Patient ID: Tammy Knight, female    DOB: 1990/06/19,  MRN: [id number]  Chief Complaint  Patient presents with   Foot Pain    Follow up PF bilateral   "Its definitely better. I haven't really been wearing the braces that much. My husband got me some new house shoes that are for plantar fasciitis and those are helping."    33 y.o. female presents with the above complaint.  Patient presents with follow-up of bilateral plantar fasciitis she states that she is doing a lot better still some residual pain would like to discuss next treatment plan denies any other acute complaints Past Medical History:  Diagnosis Date   Asthma    h/o no inhalers   Chlamydia    Depression    Hay fever    UTI (urinary tract infection)     Current Outpatient Medications:    escitalopram (LEXAPRO) 10 MG tablet, Take 1 tablet (10 mg total) by mouth daily., Disp: 30 tablet, Rfl: 0   lisdexamfetamine (VYVANSE) 20 MG capsule, Take 1 capsule (20 mg total) by mouth daily., Disp: 30 capsule, Rfl: 0   lisdexamfetamine (VYVANSE) 30 MG capsule, Take 1 capsule (30 mg total) by mouth daily., Disp: 30 capsule, Rfl: 0  Social History   Tobacco Use  Smoking Status Former   Current packs/day: 0.00   Types: Cigarettes   Start date: 01/07/2013   Quit date: 01/07/2018   Years since quitting: 5.5  Smokeless Tobacco Never  Tobacco Comments   1-2 cig per day    Allergies  Allergen Reactions   Penicillins Anaphylaxis   Bee Venom Swelling   Objective:  There were no vitals filed for this visit. There is no height or weight on file to calculate BMI. Constitutional Well developed. Well nourished.  Vascular Dorsalis pedis pulses palpable bilaterally. Posterior tibial pulses palpable bilaterally. Capillary refill normal to all digits.  No cyanosis or clubbing noted. Pedal hair growth normal.  Neurologic Normal speech. Oriented to person, place, and time. Epicritic sensation to light touch  grossly present bilaterally.  Dermatologic Nails well groomed and normal in appearance. No open wounds. No skin lesions.  Orthopedic: Normal joint ROM without pain or crepitus bilaterally. No visible deformities. Tender to palpation at the calcaneal tuber bilaterally. No pain with calcaneal squeeze bilaterally. Ankle ROM diminished range of motion bilaterally. Silfverskiold Test: positive bilaterally.   Radiographs: None  Assessment:   No diagnosis found.  Plan:  Patient was evaluated and treated and all questions answered.  Plantar Fasciitis, bilaterally - XR reviewed as above.  - Educated on icing and stretching. Instructions given.  - Second injection delivered to the plantar fascia as below. - DME: Plantar fascial brace dispensed to support the medial longitudinal arch of the foot and offload pressure from the heel and prevent arch collapse during weightbearing - Pharmacologic management: None  Pes planovalgus -I explained to patient the etiology of pes planovalgus and relationship with Planter fasciitis and various treatment options were discussed.  Given patient foot structure in the setting of Planter fasciitis I believe patient will benefit from custom-made orthotics to help control the hindfoot motion support the arch of the foot and take the stress away from plantar fascial.  Patient agrees with the plan like to proceed with orthotics -Patient was casted for orthotics   Procedure: Injection Tendon/Ligament Location: Bilateral plantar fascia at the glabrous junction; medial approach. Skin Prep: alcohol Injectate: 0.5 cc 0.5% marcaine plain, 0.5 cc of 1% Lidocaine, 0.5 cc  kenalog 10. Disposition: Patient tolerated procedure well. Injection site dressed with a band-aid.  No follow-ups on file.

## 2023-08-04 ENCOUNTER — Encounter: Payer: Self-pay | Admitting: Family Medicine

## 2023-08-04 NOTE — Telephone Encounter (Signed)
**Note De-identified  [place] Obfuscation** Please advise 

## 2023-08-05 NOTE — Telephone Encounter (Signed)
 Ok to order labs. We can discuss more at her upcoming appointment.  Jinny Mounts. Daneil Dunker, MD 08/05/2023 4:20 PM

## 2023-08-09 ENCOUNTER — Other Ambulatory Visit: Payer: Self-pay | Admitting: *Deleted

## 2023-08-09 DIAGNOSIS — N912 Amenorrhea, unspecified: Secondary | ICD-10-CM

## 2023-08-09 NOTE — Telephone Encounter (Signed)
 Please schedule lab appt before next visit  Future lab order

## 2023-08-11 ENCOUNTER — Other Ambulatory Visit (INDEPENDENT_AMBULATORY_CARE_PROVIDER_SITE_OTHER)

## 2023-08-11 DIAGNOSIS — N912 Amenorrhea, unspecified: Secondary | ICD-10-CM | POA: Diagnosis not present

## 2023-08-11 LAB — CORTISOL: Cortisol, Plasma: 6.8 ug/dL

## 2023-08-11 LAB — FOLLICLE STIMULATING HORMONE: FSH: 7 m[IU]/mL

## 2023-08-12 ENCOUNTER — Encounter: Payer: Self-pay | Admitting: Family Medicine

## 2023-08-12 LAB — DHEA-SULFATE: DHEA-SO4: 47 ug/dL (ref 19–237)

## 2023-08-12 LAB — ESTRADIOL: Estradiol: 65 pg/mL

## 2023-08-12 NOTE — Progress Notes (Signed)
 Labs are all within normal range.  We can discuss more at upcoming visit.

## 2023-08-13 ENCOUNTER — Encounter: Payer: Self-pay | Admitting: Family Medicine

## 2023-08-13 ENCOUNTER — Ambulatory Visit (INDEPENDENT_AMBULATORY_CARE_PROVIDER_SITE_OTHER): Admitting: Family Medicine

## 2023-08-13 VITALS — BP 106/69 | HR 73 | Temp 97.3°F | Ht 65.0 in | Wt 266.0 lb

## 2023-08-13 DIAGNOSIS — F419 Anxiety disorder, unspecified: Secondary | ICD-10-CM

## 2023-08-13 DIAGNOSIS — F3281 Premenstrual dysphoric disorder: Secondary | ICD-10-CM | POA: Diagnosis not present

## 2023-08-13 DIAGNOSIS — F324 Major depressive disorder, single episode, in partial remission: Secondary | ICD-10-CM

## 2023-08-13 MED ORDER — PAROXETINE HCL 10 MG PO TABS: 10.0000 mg | ORAL_TABLET | Freq: Every day | ORAL | 1 refills | Status: DC

## 2023-08-13 NOTE — Assessment & Plan Note (Signed)
 See PMDD A/P.  Will be starting intermittent dosing passel as above.  She will follow-up with therapy.

## 2023-08-13 NOTE — Progress Notes (Signed)
 Tammy Knight is a 33 y.o. female who presents today for an office visit.  Assessment/Plan:  Chronic Problems Addressed Today: PMDD (premenstrual dysphoric disorder) Had lengthy discussion with patient today regarding her PMDD symptoms.  Her workup and labs thus far have all been negative.  She is no longer on Wellbutrin  or Lexapro .  She is interested in possible trial of alternative SSRIs.  She believes that she did very well with Paxil in the past.  We will try intermittent dosing of this 10 mg daily 2 weeks before her period starts and continuing until period starts.  She will follow-up with us  in a few weeks.  Depending on response to this may consider increasing the dose versus trial of alternative SSRI.  We also did discuss referral to see therapist.  She believes her insurance will pay for better help with work though she will also check with outside therapist as well.  She will let us  know if she needs a referral.  We also did discuss GeneSight testing to better tailor her treatment plan.  She will check with insurance to see if this is a covered benefit.  Anxiety disorder See PMDD A/P.  Will be starting intermittent dosing passel as above.  She will follow-up with therapy.  Depression, major, single episode, in partial remission (HCC) See PMDD A/P.  Currently not on any medications.  Overall feels okay though does have significant flareup of symptoms during premenstrual period.  We are starting intermittent Paxil as above and she will follow-up with us  in a few weeks.  She will also be following up with a therapist as above.     Subjective:  HPI:  See A/P for status of chronic conditions.  Patient is here today for follow-up.  She is concerned about potential hormone disorder and thyroid  test.  She has had significant ongoing issues with PMDD.  She is additionally experiencing more symptoms recently including elevated blood pressure, peripheral edema, epistaxis,  headache, and brain fog.  Overall symptoms are stable.  She is no longer having suicidal ideation though does have persistent depression and anxiety regarding her mood symptoms.  Since her last visit she was able to wean off the Lexapro .  She did well with weaning off of this.  Still having intermittent issues with irritability especially in the premenstrual phase approximately a week or so before she starts her period.  She is open to trying different medications to help with this at this point.     08/13/2023    8:27 AM 05/06/2023   10:23 AM 03/31/2023    8:28 AM 03/22/2023    8:29 AM 03/12/2023    8:16 AM  Depression screen PHQ 2/9  Decreased Interest 1 1 0 2 2  Down, Depressed, Hopeless 2 1 0 2 2  PHQ - 2 Score 3 2 0 4 4  Altered sleeping 2 3 0 2 2  Tired, decreased energy 2 0 0 1 2  Change in appetite 2 3 0 3 2  Feeling bad or failure about yourself  1 0 0 2 2  Trouble concentrating 3 1 0 1 2  Moving slowly or fidgety/restless 2 0 0 2 1  Suicidal thoughts 0 0 0 0 0  PHQ-9 Score 15 9 0 15 15  Difficult doing work/chores  Somewhat difficult Not difficult at all Somewhat difficult Very difficult          Objective:  Physical Exam: BP 106/69   Pulse 73   Temp (!)  97.3 F (36.3 C) (Temporal)   Ht 5\' 5"  (1.651 m)   Wt 266 lb (120.7 kg)   LMP 08/05/2023   SpO2 98%   BMI 44.26 kg/m   Gen: No acute distress, resting comfortably Neuro: Grossly normal, moves all extremities Psych: Normal affect and thought content      Brynnan Rodenbaugh M. Daneil Dunker, MD 08/13/2023 8:39 AM

## 2023-08-13 NOTE — Assessment & Plan Note (Signed)
 Had lengthy discussion with patient today regarding her PMDD symptoms.  Her workup and labs thus far have all been negative.  She is no longer on Wellbutrin  or Lexapro .  She is interested in possible trial of alternative SSRIs.  She believes that she did very well with Paxil in the past.  We will try intermittent dosing of this 10 mg daily 2 weeks before her period starts and continuing until period starts.  She will follow-up with us  in a few weeks.  Depending on response to this may consider increasing the dose versus trial of alternative SSRI.  We also did discuss referral to see therapist.  She believes her insurance will pay for better help with work though she will also check with outside therapist as well.  She will let us  know if she needs a referral.  We also did discuss GeneSight testing to better tailor her treatment plan.  She will check with insurance to see if this is a covered benefit.

## 2023-08-13 NOTE — Assessment & Plan Note (Signed)
 See PMDD A/P.  Currently not on any medications.  Overall feels okay though does have significant flareup of symptoms during premenstrual period.  We are starting intermittent Paxil as above and she will follow-up with us  in a few weeks.  She will also be following up with a therapist as above.

## 2023-08-13 NOTE — Patient Instructions (Signed)
 It was very nice to see you today!  We will start Paxil startfor your symptoms.  Please start this 2 weeks before your period starts and continue until your period starts.  Please let me know if you need referral to see a therapist.  Please check with your insurance if they will pay for GeneSight testing.  Please follow-up with me in a couple weeks to let us  know how you are doing.   Take care, Dr Daneil Dunker  PLEASE NOTE:  If you had any lab tests, please let us  know if you have not heard back within a few days. You may see your results on mychart before we have a chance to review them but we will give you a call once they are reviewed by us .   If we ordered any referrals today, please let us  know if you have not heard from their office within the next week.   If you had any urgent prescriptions sent in today, please check with the pharmacy within an hour of our visit to make sure the prescription was transmitted appropriately.   Please try these tips to maintain a healthy lifestyle:  Eat at least 3 REAL meals and 1-2 snacks per day.  Aim for no more than 5 hours between eating.  If you eat breakfast, please do so within one hour of getting up.   Each meal should contain half fruits/vegetables, one quarter protein, and one quarter carbs (no bigger than a computer mouse)  Cut down on sweet beverages. This includes juice, soda, and sweet tea.   Drink at least 1 glass of water  with each meal and aim for at least 8 glasses per day  Exercise at least 150 minutes every week.

## 2023-08-23 ENCOUNTER — Ambulatory Visit: Admitting: Podiatry

## 2023-08-23 ENCOUNTER — Ambulatory Visit (INDEPENDENT_AMBULATORY_CARE_PROVIDER_SITE_OTHER)

## 2023-08-23 ENCOUNTER — Ambulatory Visit (INDEPENDENT_AMBULATORY_CARE_PROVIDER_SITE_OTHER): Admitting: Podiatry

## 2023-08-23 DIAGNOSIS — S93491A Sprain of other ligament of right ankle, initial encounter: Secondary | ICD-10-CM

## 2023-08-23 DIAGNOSIS — M25571 Pain in right ankle and joints of right foot: Secondary | ICD-10-CM

## 2023-08-23 MED ORDER — MELOXICAM 7.5 MG PO TABS: 7.5000 mg | ORAL_TABLET | Freq: Every day | ORAL | 1 refills | Status: DC

## 2023-08-23 NOTE — Progress Notes (Unsigned)
 Chief Complaint  Patient presents with   Foot Injury    Right ankle sprain 2 weeks ago. 5 pain walking and radiating behind the knee. Non diuabetic.   HPI: 33 y.o. female presents today with concern of right ankle pain.  She sprained her right ankle in her yard approximately 2 weeks ago.  Pain has been present daily ever since then.  It is present whether she is on her feet or off her feet.  Pain is 5/10.  She denies feeling a pop or snap sensation.  Past Medical History:  Diagnosis Date   Asthma    h/o no inhalers   Chlamydia    Depression    Hay fever    UTI (urinary tract infection)     Past Surgical History:  Procedure Laterality Date   ABDOMINOPLASTY  2014   CESAREAN SECTION N/A 11/24/2019   Procedure: CESAREAN SECTION;  Surgeon: Thora Flint, MD;  Location: [facility];  Service: Obstetrics;  Laterality: N/A;   LAPAROSCOPIC SALPINGO OOPHERECTOMY Right 06/13/2018   Procedure: LAPAROSCOPIC RIGHT  OOPHORECTOMY;  Surgeon: Zenobia Hila, MD;  Location: [facility] ORS;  Service: Gynecology;  Laterality: Right;   TONSILLECTOMY      Allergies  Allergen Reactions   Penicillins Anaphylaxis   Bee Venom Swelling    Review of Systems  Musculoskeletal:  Positive for joint pain.     Physical Exam: Palpable pedal pulses are noted.  Localized edema to the anterolateral aspect of the right ankle is noted.  No ecchymosis or erythema is noted there is pain on palpation across the anterior aspect of the ankle joint as well as the anterior talofibular ligament.  No pain on palpation to the peroneal tendons, the fifth metatarsal base, the Achilles tendon or the medial deltoid ligament.  Some pain with inversion of the ankle noted.  No crepitus with range of motion of the ankle.  Epicritic sensation is intact  Radiographic Exam right foot (2 views), right ankle (3 views), 08/23/2023:  Normal osseous mineralization. Joint spaces preserved.  Long second metatarsal noted.  Ankle mortise is intact.   No fracture seen.  Assessment/Plan of Care: 1. Acute right ankle pain   2. Sprain of anterior talofibular ligament of right ankle, initial encounter      Meds ordered this encounter  Medications   meloxicam (MOBIC) 7.5 MG tablet    Sig: Take 1 tablet (7.5 mg total) by mouth daily.    Dispense:  30 tablet    Refill:  1   DG ANKLE COMPLETE RIGHT  Discussed clinical and radiographic findings with patient today.  Will send in prescription meloxicam 7.5 mg 1 tablet p.o. daily for pain and inflammation.  Will immobilize the patient in a speed brace on the right.  This is to be worn at all times weightbearing to provide additional support to the strained anterior talofibular ligament and also provide compression.  This can help her remain ambulatory while the area is healing.  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  She was instructed to ice and elevate the foot several times per day, especially after being on her feet  Follow-up in 3 to 4 weeks for recheck   Joe Murders, DPM, FACFAS [facility]     [address]  [place], [place] [address]                Office [phone]  Fax [phone]

## 2023-08-27 ENCOUNTER — Telehealth: Payer: Self-pay

## 2023-08-27 DIAGNOSIS — K589 Irritable bowel syndrome without diarrhea: Secondary | ICD-10-CM | POA: Diagnosis not present

## 2023-08-27 DIAGNOSIS — R5383 Other fatigue: Secondary | ICD-10-CM | POA: Diagnosis not present

## 2023-08-27 DIAGNOSIS — N943 Premenstrual tension syndrome: Secondary | ICD-10-CM | POA: Diagnosis not present

## 2023-08-27 DIAGNOSIS — Z131 Encounter for screening for diabetes mellitus: Secondary | ICD-10-CM | POA: Diagnosis not present

## 2023-08-27 DIAGNOSIS — Z1329 Encounter for screening for other suspected endocrine disorder: Secondary | ICD-10-CM | POA: Diagnosis not present

## 2023-08-27 DIAGNOSIS — E559 Vitamin D deficiency, unspecified: Secondary | ICD-10-CM | POA: Diagnosis not present

## 2023-08-27 DIAGNOSIS — R202 Paresthesia of skin: Secondary | ICD-10-CM | POA: Diagnosis not present

## 2023-08-27 NOTE — Telephone Encounter (Signed)
 LVM to schedule orthotic PU

## 2023-09-06 NOTE — Telephone Encounter (Signed)
 Pt LVM on 09/06/23 checking on the status of her orthotics. I called her back to schedule an appointment and due to her busy work schedule she said she will call us  back to schedule an appointment.

## 2023-09-10 ENCOUNTER — Encounter: Payer: Self-pay | Admitting: Podiatry

## 2023-09-20 ENCOUNTER — Ambulatory Visit: Admitting: Family Medicine

## 2023-09-22 ENCOUNTER — Ambulatory Visit (INDEPENDENT_AMBULATORY_CARE_PROVIDER_SITE_OTHER): Admitting: Family Medicine

## 2023-09-22 ENCOUNTER — Encounter: Payer: Self-pay | Admitting: Family Medicine

## 2023-09-22 VITALS — BP 110/69 | HR 83 | Temp 98.1°F | Ht 65.0 in | Wt 262.0 lb

## 2023-09-22 DIAGNOSIS — F324 Major depressive disorder, single episode, in partial remission: Secondary | ICD-10-CM | POA: Diagnosis not present

## 2023-09-22 DIAGNOSIS — F419 Anxiety disorder, unspecified: Secondary | ICD-10-CM

## 2023-09-22 DIAGNOSIS — F3281 Premenstrual dysphoric disorder: Secondary | ICD-10-CM | POA: Diagnosis not present

## 2023-09-22 NOTE — Patient Instructions (Addendum)
 It was very nice to see you today!  I will refer you to see a a therapist.  Will try to get you back into see Dr. Delaine Favorite.  If not we may need to send you to [facility] or another outpatient facility.  You can continue with your Lexapro .  Please let me know in a few weeks how you are doing.  Return in about 2 weeks (around 10/06/2023).   Take care, Dr Daneil Dunker  PLEASE NOTE:  If you had any lab tests, please let us  know if you have not heard back within a few days. You may see your results on mychart before we have a chance to review them but we will give you a call once they are reviewed by us .   If we ordered any referrals today, please let us  know if you have not heard from their office within the next week.   If you had any urgent prescriptions sent in today, please check with the pharmacy within an hour of our visit to make sure the prescription was transmitted appropriately.   Please try these tips to maintain a healthy lifestyle:  Eat at least 3 REAL meals and 1-2 snacks per day.  Aim for no more than 5 hours between eating.  If you eat breakfast, please do so within one hour of getting up.   Each meal should contain half fruits/vegetables, one quarter protein, and one quarter carbs (no bigger than a computer mouse)  Cut down on sweet beverages. This includes juice, soda, and sweet tea.   Drink at least 1 glass of water  with each meal and aim for at least 8 glasses per day  Exercise at least 150 minutes every week.

## 2023-09-22 NOTE — Assessment & Plan Note (Signed)
 See anxiety A/P.  Symptoms are not well-controlled though she did recently restart her Lexapro .  She did not end up starting the Paxil  we discussed at her last office visit.  Will be placing referral for her to establish with therapist.

## 2023-09-22 NOTE — Assessment & Plan Note (Signed)
 Had lengthy discussion with patient today regarding her anxiety and depression symptoms including recent panic attacks.  Symptoms are not well-controlled.  She has recently restarted Lexapro  and has done well without side effects thus far and notes still having significant issue with this.  She is primarily interested in referral to see a therapist today.  She did see a therapist about a year and a half ago for ADHD evaluation.  Would like to go back to see them if possible.  Will place referral today.  She also may be interested in other outpatient treatment facilities as well.  If we cannot get her back and see her previous therapist in reasonable timeframe, then we can refer her to another local office.  Also recommend patient renew her FMLA for intermittently.  She will discuss this with her HR department.  We will continue her Lexapro  10 mg daily for now.  She did get some benefit from this in the past however it did not fully alleviate her symptoms.  She will follow-up with us  in a couple of weeks and we can adjust the medications as tolerated.

## 2023-09-22 NOTE — Progress Notes (Signed)
 Tammy Knight is a 33 y.o. female who presents today for an office visit.  Assessment/Plan:  Chronic Problems Addressed Today: Anxiety disorder Had lengthy discussion with patient today regarding her anxiety and depression symptoms including recent panic attacks.  Symptoms are not well-controlled.  She has recently restarted Lexapro  and has done well without side effects thus far and notes still having significant issue with this.  She is primarily interested in referral to see a therapist today.  She did see a therapist about a year and a half ago for ADHD evaluation.  Would like to go back to see them if possible.  Will place referral today.  She also may be interested in other outpatient treatment facilities as well.  If we cannot get her back and see her previous therapist in reasonable timeframe, then we can refer her to another local office.  Also recommend patient renew her FMLA for intermittently.  She will discuss this with her HR department.  We will continue her Lexapro  10 mg daily for now.  She did get some benefit from this in the past however it did not fully alleviate her symptoms.  She will follow-up with us  in a couple of weeks and we can adjust the medications as tolerated.  Depression, major, single episode, in partial remission (HCC) See anxiety A/P.  Symptoms are not well-controlled though she did recently restart her Lexapro .  She did not end up starting the Paxil  we discussed at her last office visit.  Will be placing referral for her to establish with therapist.     Subjective:  HPI:  See Assessment / plan for status of chronic conditions. She is here today for follow up. I last saw her about 6 weeks ago for PMDD and anxiety/depression. At that visit we had a lengthy discussion regarding her mental health including depression, anxiety, and PMDD.  At that point we had discussed medication treatment options and she was interested in starting Paxil  due to  concern for PMDD.  She also was looking into establish with a therapist.  Since her last visit she did not end up starting the Paxil  due to concern for side effects.  Unfortunately she has had continued issues with both anxiety and depression.  She has also had significant issues with social stressors including relationship with her father.  A couple of days ago she restarted Lexapro  on her own to 10 mg daily.  She has tolerated this well thus far though still having significant issues with ongoing anxiety and depression.       09/22/2023    8:21 AM 08/13/2023    8:27 AM 05/06/2023   10:23 AM 03/31/2023    8:28 AM 03/22/2023    8:29 AM  Depression screen PHQ 2/9  Decreased Interest 2 1 1  0 2  Down, Depressed, Hopeless 2 2 1  0 2  PHQ - 2 Score 4 3 2  0 4  Altered sleeping 2 2 3  0 2  Tired, decreased energy 3 2 0 0 1  Change in appetite 2 2 3  0 3  Feeling bad or failure about yourself  2 1 0 0 2  Trouble concentrating 3 3 1  0 1  Moving slowly or fidgety/restless 1 2 0 0 2  Suicidal thoughts 1 0 0 0 0  PHQ-9 Score 18 15 9  0 15  Difficult doing work/chores Very difficult  Somewhat difficult Not difficult at all Somewhat difficult        09/22/2023  8:21 AM 05/06/2023   10:25 AM 03/12/2023    8:16 AM 03/10/2023    3:02 PM  GAD 7 : Generalized Anxiety Score  Nervous, Anxious, on Edge 2 1 2 2   Control/stop worrying 2 1 2 2   Worry too much - different things 3 3 2 2   Trouble relaxing 3 3 2 2   Restless 3 3 2 2   Easily annoyed or irritable 3 0 0 2  Afraid - awful might happen 1 1 0 0  Total GAD 7 Score 17 12 10 12   Anxiety Difficulty Very difficult Somewhat difficult  Very difficult            Objective:  Physical Exam: BP 110/69   Pulse 83   Temp 98.1 F (36.7 C) (Temporal)   Ht 5\' 5"  (1.651 m)   Wt 262 lb (118.8 kg)   SpO2 99%   BMI 43.60 kg/m   Gen: No acute distress, resting comfortably Neuro: Grossly normal, moves all extremities Psych: Normal affect and thought  content      Alyana Kreiter M. Daneil Dunker, MD 09/22/2023 8:41 AM

## 2023-09-27 ENCOUNTER — Encounter: Payer: Self-pay | Admitting: Family Medicine

## 2023-09-27 NOTE — Telephone Encounter (Signed)
 See note

## 2023-09-28 NOTE — Telephone Encounter (Signed)
 Can we check on her referral status? Looks like it has been authorized in the chart.  Jinny Mounts. Daneil Dunker, MD 09/28/2023 7:31 AM

## 2023-09-30 ENCOUNTER — Other Ambulatory Visit: Payer: Self-pay | Admitting: *Deleted

## 2023-09-30 NOTE — Telephone Encounter (Signed)
 Left message to return call to our office at their convenience.

## 2023-10-04 ENCOUNTER — Other Ambulatory Visit: Payer: Self-pay | Admitting: *Deleted

## 2023-10-04 MED ORDER — ESCITALOPRAM OXALATE 10 MG PO TABS
10.0000 mg | ORAL_TABLET | Freq: Every day | ORAL | 0 refills | Status: DC
Start: 2023-10-04 — End: 2023-12-07

## 2023-10-04 NOTE — Telephone Encounter (Signed)
 No need for referral at this time Requested Rx Lexapro  10mg , ok to send Per Dr Daneil Dunker  Rx send

## 2023-10-08 DIAGNOSIS — F331 Major depressive disorder, recurrent, moderate: Secondary | ICD-10-CM | POA: Diagnosis not present

## 2023-10-11 DIAGNOSIS — F331 Major depressive disorder, recurrent, moderate: Secondary | ICD-10-CM | POA: Diagnosis not present

## 2023-10-12 DIAGNOSIS — F331 Major depressive disorder, recurrent, moderate: Secondary | ICD-10-CM | POA: Diagnosis not present

## 2023-10-13 DIAGNOSIS — F331 Major depressive disorder, recurrent, moderate: Secondary | ICD-10-CM | POA: Diagnosis not present

## 2023-10-14 ENCOUNTER — Other Ambulatory Visit

## 2023-10-14 DIAGNOSIS — F331 Major depressive disorder, recurrent, moderate: Secondary | ICD-10-CM | POA: Diagnosis not present

## 2023-10-15 DIAGNOSIS — F331 Major depressive disorder, recurrent, moderate: Secondary | ICD-10-CM | POA: Diagnosis not present

## 2023-10-18 DIAGNOSIS — F331 Major depressive disorder, recurrent, moderate: Secondary | ICD-10-CM | POA: Diagnosis not present

## 2023-10-19 ENCOUNTER — Ambulatory Visit (INDEPENDENT_AMBULATORY_CARE_PROVIDER_SITE_OTHER): Admitting: Podiatry

## 2023-10-19 DIAGNOSIS — F331 Major depressive disorder, recurrent, moderate: Secondary | ICD-10-CM | POA: Diagnosis not present

## 2023-10-19 DIAGNOSIS — M722 Plantar fascial fibromatosis: Secondary | ICD-10-CM

## 2023-10-19 MED ORDER — METHYLPREDNISOLONE 4 MG PO TBPK: ORAL_TABLET | ORAL | 0 refills | Status: DC

## 2023-10-19 NOTE — Patient Instructions (Signed)

## 2023-10-19 NOTE — Progress Notes (Signed)
  Subjective:  Patient ID: Tammy Knight, female    DOB: July 16, 1990,  MRN: [id number]  Chief Complaint  Patient presents with   Plantar Fasciitis    right foot plantar facsiitis severe pain/ flare up    33 y.o. female presents with the above complaint.  Patient presents for acute flare of right heel pain. Previously treated with injection.   Past Medical History:  Diagnosis Date   Asthma    h/o no inhalers   Chlamydia    Depression    Hay fever    UTI (urinary tract infection)     Current Outpatient Medications:    escitalopram  (LEXAPRO ) 10 MG tablet, Take 1 tablet (10 mg total) by mouth daily., Disp: 90 tablet, Rfl: 0   lisdexamfetamine (VYVANSE ) 20 MG capsule, Take 1 capsule (20 mg total) by mouth daily., Disp: 30 capsule, Rfl: 0   lisdexamfetamine (VYVANSE ) 30 MG capsule, Take 1 capsule (30 mg total) by mouth daily., Disp: 30 capsule, Rfl: 0   meloxicam  (MOBIC ) 7.5 MG tablet, Take 1 tablet (7.5 mg total) by mouth daily., Disp: 30 tablet, Rfl: 1  Social History   Tobacco Use  Smoking Status Former   Current packs/day: 0.00   Types: Cigarettes   Start date: 01/07/2013   Quit date: 01/07/2018   Years since quitting: 5.7  Smokeless Tobacco Never  Tobacco Comments   1-2 cig per day    Allergies  Allergen Reactions   Penicillins Anaphylaxis   Bee Venom Swelling   Objective:  There were no vitals filed for this visit. There is no height or weight on file to calculate BMI. Constitutional Well developed. Well nourished.  Vascular Dorsalis pedis pulses palpable bilaterally. Posterior tibial pulses palpable bilaterally. Capillary refill normal to all digits.  No cyanosis or clubbing noted. Pedal hair growth normal.  Neurologic Normal speech. Oriented to person, place, and time. Epicritic sensation to light touch grossly present bilaterally.  Dermatologic Nails well groomed and normal in appearance. No open wounds. No skin lesions.  Orthopedic: Normal  joint ROM without pain or crepitus bilaterally. No visible deformities. Tender to palpation at the calcaneal tuber bilaterally. No pain with calcaneal squeeze bilaterally. Ankle ROM diminished range of motion bilaterally. Silfverskiold Test: positive bilaterally.   Radiographs: None  Assessment:   1. Plantar fasciitis, right     Plan:  Patient was evaluated and treated and all questions answered.  Plantar Fasciitis, Right - XR reviewed as above.  - Educated on icing and stretching. Instructions given.  - Steroid injection delivered to the plantar fascia as below. - DME: continue Plantar fascial braceto support the medial longitudinal arch of the foot and offload pressure from the heel and prevent arch collapse during weightbearing - Pharmacologic management: Medrol  steroid Dosepak E Rx sent to patient's pharmacy   Procedure: Injection Tendon/Ligament Location: Right plantar fascia at the glabrous junction; medial approach. Skin Prep: alcohol Injectate: 0.5 cc 0.5% marcaine  plain, 0.5 cc of 1% Lidocaine , 0.5 cc kenalog 10. Disposition: Patient tolerated procedure well. Injection site dressed with a band-aid.  Return if symptoms worsen or fail to improve.

## 2023-10-20 ENCOUNTER — Other Ambulatory Visit

## 2023-10-20 ENCOUNTER — Ambulatory Visit

## 2023-10-20 DIAGNOSIS — F331 Major depressive disorder, recurrent, moderate: Secondary | ICD-10-CM | POA: Diagnosis not present

## 2023-10-20 NOTE — Progress Notes (Signed)
Patient presents today to pick up custom molded foot orthotics, diagnosed with PF by Dr. Allena Katz.   Orthotics were dispensed and fit was satisfactory. Reviewed instructions for break-in and wear. Written instructions given to patient.  Patient will follow up as needed.   Tammy Knight Cped, CFo, CFm

## 2023-10-21 DIAGNOSIS — F331 Major depressive disorder, recurrent, moderate: Secondary | ICD-10-CM | POA: Diagnosis not present

## 2023-10-25 DIAGNOSIS — F331 Major depressive disorder, recurrent, moderate: Secondary | ICD-10-CM | POA: Diagnosis not present

## 2023-10-26 DIAGNOSIS — F331 Major depressive disorder, recurrent, moderate: Secondary | ICD-10-CM | POA: Diagnosis not present

## 2023-10-27 DIAGNOSIS — F331 Major depressive disorder, recurrent, moderate: Secondary | ICD-10-CM | POA: Diagnosis not present

## 2023-10-28 DIAGNOSIS — F331 Major depressive disorder, recurrent, moderate: Secondary | ICD-10-CM | POA: Diagnosis not present

## 2023-10-29 DIAGNOSIS — F331 Major depressive disorder, recurrent, moderate: Secondary | ICD-10-CM | POA: Diagnosis not present

## 2023-11-01 DIAGNOSIS — F331 Major depressive disorder, recurrent, moderate: Secondary | ICD-10-CM | POA: Diagnosis not present

## 2023-11-02 DIAGNOSIS — F331 Major depressive disorder, recurrent, moderate: Secondary | ICD-10-CM | POA: Diagnosis not present

## 2023-11-03 DIAGNOSIS — F331 Major depressive disorder, recurrent, moderate: Secondary | ICD-10-CM | POA: Diagnosis not present

## 2023-11-04 DIAGNOSIS — F331 Major depressive disorder, recurrent, moderate: Secondary | ICD-10-CM | POA: Diagnosis not present

## 2023-11-05 DIAGNOSIS — F331 Major depressive disorder, recurrent, moderate: Secondary | ICD-10-CM | POA: Diagnosis not present

## 2023-11-28 ENCOUNTER — Other Ambulatory Visit: Payer: Self-pay

## 2023-11-28 ENCOUNTER — Encounter (HOSPITAL_COMMUNITY): Payer: Self-pay

## 2023-11-28 ENCOUNTER — Emergency Department (HOSPITAL_COMMUNITY)
Admission: EM | Admit: 2023-11-28 | Discharge: 2023-11-28 | Disposition: A | Discharge status: Home / Self Care | Attending: Emergency Medicine | Admitting: Emergency Medicine

## 2023-11-28 DIAGNOSIS — R102 Pelvic and perineal pain: Secondary | ICD-10-CM | POA: Insufficient documentation

## 2023-11-28 LAB — URINALYSIS, ROUTINE W REFLEX MICROSCOPIC
Bilirubin Urine: NEGATIVE
Glucose, UA: NEGATIVE mg/dL
Ketones, ur: NEGATIVE mg/dL
Leukocytes,Ua: NEGATIVE
Nitrite: NEGATIVE
Protein, ur: NEGATIVE mg/dL
Specific Gravity, Urine: 1.012 (ref 1.005–1.030)
pH: 7 (ref 5.0–8.0)

## 2023-11-28 LAB — WET PREP, GENITAL
Clue Cells Wet Prep HPF POC: NONE SEEN
Sperm: NONE SEEN
Trich, Wet Prep: NONE SEEN
WBC, Wet Prep HPF POC: <10 (ref <10)
Yeast Wet Prep HPF POC: NONE SEEN

## 2023-11-28 NOTE — ED Triage Notes (Signed)
 Pt to ED, reports pressure to vagina, pt says she isn't sure if she has a tampon in vagina or not, says she can't feel the string and unsure if she took one out yesterday.

## 2023-11-28 NOTE — Discharge Instructions (Signed)
 Please follow-up with your gynecologist she has any further concerns.  Your wet prep was negative did not show any bacterial vaginosis or trichomoniasis or yeast infection.

## 2023-11-28 NOTE — ED Provider Notes (Signed)
 [place] EMERGENCY DEPARTMENT AT [facility] Provider Note   CSN: [id number] Arrival date & time: 11/28/23  1925     Patient presents with: vaginal problem   Tammy Knight is a 33 y.o. female who presents out of concern for retained tampon.  She states that yesterday as she was busy with her child's birthday party, she placed a tampon and only after doing so could not remember if she had removed the prior one.  She states that she removed that 1 however still feels like there is a retained tampon with increased pressure.  She denies having any pelvic pain, has not had any fever, body aches, chills and has not had any nausea or vomiting.  She presented purely out of concern that there is a retained tampon in the vaginal vault.  Denies having any vaginal discharge, though does endorse that this is the third day of her menstrual cycle.   HPI     Prior to Admission medications   Medication Sig Start Date End Date Taking? Authorizing Provider  escitalopram  (LEXAPRO ) 10 MG tablet Take 1 tablet (10 mg total) by mouth daily. 10/04/23   Parker, Caleb M, MD  lisdexamfetamine (VYVANSE ) 20 MG capsule Take 1 capsule (20 mg total) by mouth daily. 03/16/23   Kennyth Worth HERO, MD  lisdexamfetamine (VYVANSE ) 30 MG capsule Take 1 capsule (30 mg total) by mouth daily. 07/02/23   Kennyth Worth HERO, MD  meloxicam  (MOBIC ) 7.5 MG tablet Take 1 tablet (7.5 mg total) by mouth daily. 08/23/23   McCaughan, Dia D, DPM  methylPREDNISolone  (MEDROL  DOSEPAK) 4 MG TBPK tablet Take as directed for 6 days 10/19/23   Standiford, Marsa FALCON, DPM    Allergies: Penicillins and Bee venom    Review of Systems  Genitourinary:  Positive for menstrual problem.  All other systems reviewed and are negative.   Updated Vital Signs BP 120/74 (BP Location: Right Arm)   Pulse 65   Temp 98.6 F (37 C) (Oral)   Resp 16   Ht 5' 5 (1.651 m)   Wt 117.9 kg   LMP 11/28/2023 (Exact Date)   SpO2 100%   BMI 43.27  kg/m   Physical Exam Vitals and nursing note reviewed. Exam conducted with a chaperone present.  Constitutional:      General: She is not in acute distress.    Appearance: She is well-developed.  HENT:     Head: Normocephalic and atraumatic.  Eyes:     Conjunctiva/sclera: Conjunctivae normal.  Cardiovascular:     Rate and Rhythm: Normal rate and regular rhythm.     Heart sounds: No murmur heard. Pulmonary:     Effort: Pulmonary effort is normal. No respiratory distress.     Breath sounds: Normal breath sounds.  Abdominal:     Palpations: Abdomen is soft.     Tenderness: There is no abdominal tenderness.  Genitourinary:    General: Normal vulva.     Exam position: Lithotomy position.     Vagina: Normal.     Cervix: Normal.     Uterus: Normal.      Adnexa: Right adnexa normal.     Rectum: Normal.  Musculoskeletal:        General: No swelling.     Cervical back: Neck supple.  Skin:    General: Skin is warm and dry.     Capillary Refill: Capillary refill takes less than 2 seconds.  Neurological:     Mental Status: She is alert.  Psychiatric:        Mood and Affect: Mood normal.     (all labs ordered are listed, but only abnormal results are displayed) Labs Reviewed  URINALYSIS, ROUTINE W REFLEX MICROSCOPIC - Abnormal; Notable for the following components:      Result Value   Color, Urine STRAW (*)    Hgb urine dipstick MODERATE (*)    Bacteria, UA RARE (*)    All other components within normal limits  WET PREP, GENITAL    EKG: None  Radiology: No results found.   Procedures   Medications Ordered in the ED - No data to display                                  Medical Decision Making Amount and/or Complexity of Data Reviewed Labs: ordered.   Medical Decision Making:   [name] is a 33 y.o. female who presented to the ED today with concerns for retained vaginal foreign body detailed above.     Complete initial physical exam  performed, notably the patient  was alert and oriented no apparent distress.  Pelvic exam did not demonstrate a retained foreign body and otherwise pelvic exam was normal..    Reviewed and confirmed nursing documentation for past medical history, family history, social history.    Initial Assessment:   With the patient's presentation of concerns for retained vaginal foreign body, most likely diagnosis is possible retained foreign body.   Initial Plan:  Performed pelvic exam as noted in the physical exam Obtain wet prep to assess for presence of bacterial vaginosis or trichomoniasis. Urinalysis with reflex culture ordered to evaluate for UTI or relevant urologic/nephrologic pathology.  Objective evaluation as below reviewed   Initial Study Results:   Laboratory  All laboratory results reviewed without evidence of clinically relevant pathology.   Exceptions include: None  Reassessment and Plan:   Following pelvic exam, no retained foreign bodies were noted.  Wet prep obtained, this is unremarkable as is the urinalysis.  As such, patient stable for discharge follow-up with her primary care and gynecologist as needed.       Final diagnoses:  Pelvic and perineal pain    ED Discharge Orders     None          Myriam Dorn BROCKS, [place] 11/28/23 2229    Towana Ozell BROCKS, MD 11/29/23 1059

## 2023-12-07 ENCOUNTER — Other Ambulatory Visit: Payer: Self-pay | Admitting: Family Medicine

## 2023-12-07 NOTE — Telephone Encounter (Unsigned)
 Copied from CRM #[id number]. Topic: Clinical - Medication Refill >> Dec 07, 2023  4:38 PM Drema MATSU wrote: Medication: escitalopram  (LEXAPRO ) 10 MG tablet * wants 90 day prescription Has the patient contacted their pharmacy? Yes (Agent: If no, request that the patient contact the pharmacy for the refill. If patient does not wish to contact the pharmacy document the reason why and proceed with request.) advised to contact doctor for 90 day supply (Agent: If yes, when and what did the pharmacy advise?)  This is the patient's preferred pharmacy:  [facility] [address], [place] - [address] [place] [address] Phone: [phone] Fax: [phone]  Is this the correct pharmacy for this prescription? Yes If no, delete pharmacy and type the correct one.   Has the prescription been filled recently? Yes  Is the patient out of the medication? No  Has the patient been seen for an appointment in the last year OR does the patient have an upcoming appointment? Yes  Can we respond through MyChart? Yes  Agent: Please be advised that Rx refills may take up to 3 business days. We ask that you follow-up with your pharmacy.

## 2023-12-08 MED ORDER — ESCITALOPRAM OXALATE 10 MG PO TABS: 10.0000 mg | ORAL_TABLET | Freq: Every day | ORAL | 0 refills | Status: AC

## 2023-12-16 ENCOUNTER — Ambulatory Visit (INDEPENDENT_AMBULATORY_CARE_PROVIDER_SITE_OTHER): Admitting: Podiatry

## 2023-12-16 DIAGNOSIS — M722 Plantar fascial fibromatosis: Secondary | ICD-10-CM

## 2023-12-16 NOTE — Progress Notes (Signed)
  Subjective:  Patient ID: Tammy Knight, female    DOB: [birth year],  MRN: [id number]  Chief Complaint  Patient presents with   Plantar Fasciitis    Bilateral. Pain scale 10    33 y.o. female presents with the above complaint.  Patient presents for acute flare of right heel pain. Previously treated with injection.   Past Medical History:  Diagnosis Date   Asthma    h/o no inhalers   Chlamydia    Depression    Hay fever    UTI (urinary tract infection)     Current Outpatient Medications:    escitalopram  (LEXAPRO ) 10 MG tablet, Take 1 tablet (10 mg total) by mouth daily., Disp: 90 tablet, Rfl: 0   lisdexamfetamine (VYVANSE ) 20 MG capsule, Take 1 capsule (20 mg total) by mouth daily., Disp: 30 capsule, Rfl: 0   lisdexamfetamine (VYVANSE ) 30 MG capsule, Take 1 capsule (30 mg total) by mouth daily., Disp: 30 capsule, Rfl: 0   meloxicam  (MOBIC ) 7.5 MG tablet, Take 1 tablet (7.5 mg total) by mouth daily., Disp: 30 tablet, Rfl: 1   methylPREDNISolone  (MEDROL  DOSEPAK) 4 MG TBPK tablet, Take as directed for 6 days, Disp: 1 each, Rfl: 0  Social History   Tobacco Use  Smoking Status Former   Current packs/day: 0.00   Types: Cigarettes   Start date: 01/07/2013   Quit date: 01/07/2018   Years since quitting: 5.9  Smokeless Tobacco Never  Tobacco Comments   1-2 cig per day    Allergies  Allergen Reactions   Penicillins Anaphylaxis   Bee Venom Swelling   Objective:  There were no vitals filed for this visit. There is no height or weight on file to calculate BMI. Constitutional Well developed. Well nourished.  Vascular Dorsalis pedis pulses palpable bilaterally. Posterior tibial pulses palpable bilaterally. Capillary refill normal to all digits.  No cyanosis or clubbing noted. Pedal hair growth normal.  Neurologic Normal speech. Oriented to person, place, and time. Epicritic sensation to light touch grossly present bilaterally.  Dermatologic Nails well groomed and  normal in appearance. No open wounds. No skin lesions.  Orthopedic: Normal joint ROM without pain or crepitus bilaterally. No visible deformities. Tender to palpation at the calcaneal tuber bilaterally. No pain with calcaneal squeeze bilaterally. Ankle ROM diminished range of motion bilaterally. Silfverskiold Test: positive bilaterally.   Radiographs: None  Assessment:   1. Plantar fasciitis, right   2. Plantar fasciitis, left     Plan:  Patient was evaluated and treated and all questions answered.  Plantar Fasciitis, bilateral - Clinically at this time her pain has not resolved with any steroid injection and immobilization orthotics management I discussed with her that she would benefit from physical therapy patient is in agreement we will plan on doing physical therapy at given her prescription for it. -If there is no improvement we will discuss PRP injection versus surgical   No follow-ups on file.

## 2023-12-22 DIAGNOSIS — R5383 Other fatigue: Secondary | ICD-10-CM | POA: Diagnosis not present

## 2023-12-22 DIAGNOSIS — K589 Irritable bowel syndrome without diarrhea: Secondary | ICD-10-CM | POA: Diagnosis not present

## 2023-12-22 DIAGNOSIS — R202 Paresthesia of skin: Secondary | ICD-10-CM | POA: Diagnosis not present

## 2023-12-22 DIAGNOSIS — N943 Premenstrual tension syndrome: Secondary | ICD-10-CM | POA: Diagnosis not present

## 2023-12-31 DIAGNOSIS — M79671 Pain in right foot: Secondary | ICD-10-CM | POA: Diagnosis not present

## 2023-12-31 DIAGNOSIS — M25571 Pain in right ankle and joints of right foot: Secondary | ICD-10-CM | POA: Diagnosis not present

## 2023-12-31 DIAGNOSIS — M79672 Pain in left foot: Secondary | ICD-10-CM | POA: Diagnosis not present

## 2024-01-03 ENCOUNTER — Ambulatory Visit: Admitting: Physician Assistant

## 2024-01-03 VITALS — BP 126/80 | HR 87 | Temp 98.6°F | Ht 65.0 in | Wt 266.0 lb

## 2024-01-03 DIAGNOSIS — G44209 Tension-type headache, unspecified, not intractable: Secondary | ICD-10-CM

## 2024-01-03 DIAGNOSIS — J029 Acute pharyngitis, unspecified: Secondary | ICD-10-CM

## 2024-01-03 DIAGNOSIS — U071 COVID-19: Secondary | ICD-10-CM

## 2024-01-03 LAB — POC INFLUENZA A&B (BINAX/QUICKVUE)
Influenza A, POC: NEGATIVE
Influenza B, POC: NEGATIVE

## 2024-01-03 LAB — POC COVID19 BINAXNOW: SARS Coronavirus 2 Ag: POSITIVE — AB

## 2024-01-03 LAB — POCT RAPID STREP A (OFFICE): Rapid Strep A Screen: NEGATIVE

## 2024-01-03 MED ORDER — KETOROLAC TROMETHAMINE 60 MG/2ML IM SOLN
60.0000 mg | Freq: Once | INTRAMUSCULAR | Status: AC
  Administered 2024-01-03: 60 mg via INTRAMUSCULAR

## 2024-01-03 NOTE — Progress Notes (Signed)
 Tammy Knight is a 33 y.o. female here for a follow up of a pre-existing problem.  History of Present Illness:   Chief Complaint  Patient presents with   Sore Throat    Pt c/o sore throat, chills, body aches and headache.     Discussed the use of AI scribe software for clinical note transcription with the patient, who gave verbal consent to proceed.  History of Present Illness Tammy Knight is a 33 year old female who presents with symptoms consistent with COVID-19.  Symptoms began suddenly last night with a severe headache, sore throat, and intense cold chills. She experienced teeth chattering and was unable to sleep. Excedrin Migraine was taken early this morning for headache relief. She feels clammy and lightheaded, with occasional sensations of near syncope. No fever, cough, or shortness of breath are present. Vision changes include decreased focus while driving.  She attended an outdoor concert on Friday and church on Sunday. She is unaware of any sick family members. 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    Past Medical History:  Diagnosis Date   Asthma    h/o no inhalers   Chlamydia    Depression    Hay fever    UTI (urinary tract infection)      Social History   Tobacco Use   Smoking status: Former    Current packs/day: 0.00    Types: Cigarettes    Start date: 01/07/2013    Quit date: 01/07/2018    Years since quitting: 5.9   Smokeless tobacco: Never   Tobacco comments:    1-2 cig per day  Vaping Use   Vaping status: Never Used  Substance Use Topics   Alcohol use: Yes    Alcohol/week: 5.0 standard drinks of alcohol    Types: 5 Glasses of wine per week    Comment: 0- 2 drinks every other day   Drug use: No    Past Surgical History:  Procedure Laterality Date   ABDOMINOPLASTY  2014   CESAREAN SECTION N/A 11/24/2019   Procedure: CESAREAN  SECTION;  Surgeon: Tomblin, James, MD;  Location: [facility] ORS;  Service: Obstetrics;  Laterality: N/A;   LAPAROSCOPIC SALPINGO OOPHERECTOMY Right 06/13/2018   Procedure: LAPAROSCOPIC RIGHT  OOPHORECTOMY;  Surgeon: Evans, David James, MD;  Location: [facility] ORS;  Service: Gynecology;  Laterality: Right;   TONSILLECTOMY      Family History  Problem Relation Age of Onset   COPD Mother    Glaucoma Mother    Arthritis Mother    Depression Mother    Heart attack Father 53       20 14   Alcohol abuse Father    Depression Father    Hyperlipidemia Father    Hypertension Father    Depression Sister    Hearing loss Sister    Birth defects Sister        no thyroid    Diabetes Paternal Grandfather    Breast cancer Maternal Aunt    Ovarian cancer Neg Hx    Colon cancer Neg Hx     Allergies  Allergen Reactions   Penicillins Anaphylaxis   Bee Venom Swelling    Current Medications:   Current Outpatient Medications:    escitalopram  (LEXAPRO ) 10 MG tablet, Take 1 tablet (10 mg total) by mouth daily., Disp: 90 tablet, Rfl: 0   lisdexamfetamine (VYVANSE ) 30 MG capsule, Take 1 capsule (30 mg total)  by mouth daily., Disp: 30 capsule, Rfl: 0   lisdexamfetamine (VYVANSE ) 20 MG capsule, Take 1 capsule (20 mg total) by mouth daily. (Patient not taking: Reported on 01/03/2024), Disp: 30 capsule, Rfl: 0   Review of Systems:   Negative unless otherwise specified per HPI.  Vitals:   Vitals:   01/03/24 0912  BP: 126/80  Pulse: 87  Temp: 98.6 F (37 C)  TempSrc: Temporal  SpO2: 97%  Weight: 266 lb (120.7 kg)  Height: 5' 5 (1.651 m)     Body mass index is 44.26 kg/m.  Physical Exam:   Physical Exam Vitals and nursing note reviewed.  Constitutional:      General: She is not in acute distress.    Appearance: She is well-developed. She is not ill-appearing or toxic-appearing.  Cardiovascular:     Rate and Rhythm: Normal rate and regular rhythm.     Pulses: Normal pulses.     Heart sounds:  Normal heart sounds, S1 normal and S2 normal.  Pulmonary:     Effort: Pulmonary effort is normal.     Breath sounds: Normal breath sounds.  Skin:    General: Skin is warm and dry.  Neurological:     General: No focal deficit present.     Mental Status: She is alert.     GCS: GCS eye subscore is 4. GCS verbal subscore is 5. GCS motor subscore is 6.     Cranial Nerves: Cranial nerves 2-12 are intact.     Sensory: Sensation is intact.     Motor: Motor function is intact.     Coordination: Coordination is intact.     Gait: Gait is intact.  Psychiatric:        Mood and Affect: Mood is anxious. Affect is tearful.        Speech: Speech normal.        Behavior: Behavior normal. Behavior is cooperative.    Results for orders placed or performed in visit on 01/03/24  POCT rapid strep A  Result Value Ref Range   Rapid Strep A Screen Negative Negative  POC COVID-19 BinaxNow  Result Value Ref Range   SARS Coronavirus 2 Ag Positive (A) Negative  POC Influenza A&B(BINAX/QUICKVUE)  Result Value Ref Range   Influenza A, POC Negative Negative   Influenza B, POC Negative Negative     Assessment and Plan:   Assessment and Plan Assessment & Plan COVID-19 infection Acute COVID-19 infection with symptoms of headache, sore throat, and chills. Discussed COVID guidelines and risk of secondary infections. - Advise rest and hydration. - Provide work note for return on Wednesday, pending improvement. - Instruct to monitor for secondary infections and report if symptoms worsen.  Acute nonintractable tension-type headache(s)  Severe headache due to COVID-19. Discussed Toradol  for immediate relief and ibuprofen  for ongoing management. - Exam wnl - Administer Toradol  injection for headache relief. - Advise against using other anti-inflammatories today. - Recommend ibuprofen  for headache management starting tomorrow. - If new/worsening symptom(s), needs to go to the ER for  evaluation     Lucie Buttner, PA-C

## 2024-01-03 NOTE — Patient Instructions (Signed)
  VISIT SUMMARY: You visited us  today with symptoms consistent with COVID-19, including a severe headache, sore throat, and chills. We discussed your symptoms and provided treatment for your headache.  YOUR PLAN: COVID-19 INFECTION: You have an acute COVID-19 infection with symptoms of headache, sore throat, and chills. -Rest and stay hydrated. -You will receive a work note to return on Wednesday, pending improvement. -Monitor for any secondary infections and report if your symptoms worsen.  ACUTE HEADACHE ASSOCIATED WITH COVID-19 INFECTION: You have a severe headache due to COVID-19. -You received a Toradol  injection for immediate headache relief. -Do not use other anti-inflammatory medications today. -You can start taking ibuprofen  for headache management starting tomorrow.                      Contains text generated by Abridge.                                 Contains text generated by Abridge.

## 2024-01-18 DIAGNOSIS — M25571 Pain in right ankle and joints of right foot: Secondary | ICD-10-CM | POA: Diagnosis not present

## 2024-01-18 DIAGNOSIS — M79671 Pain in right foot: Secondary | ICD-10-CM | POA: Diagnosis not present

## 2024-01-18 DIAGNOSIS — M79672 Pain in left foot: Secondary | ICD-10-CM | POA: Diagnosis not present

## 2024-01-20 DIAGNOSIS — M79672 Pain in left foot: Secondary | ICD-10-CM | POA: Diagnosis not present

## 2024-01-20 DIAGNOSIS — M25571 Pain in right ankle and joints of right foot: Secondary | ICD-10-CM | POA: Diagnosis not present

## 2024-01-20 DIAGNOSIS — M79671 Pain in right foot: Secondary | ICD-10-CM | POA: Diagnosis not present

## 2024-01-25 DIAGNOSIS — M25571 Pain in right ankle and joints of right foot: Secondary | ICD-10-CM | POA: Diagnosis not present

## 2024-01-25 DIAGNOSIS — M79672 Pain in left foot: Secondary | ICD-10-CM | POA: Diagnosis not present

## 2024-01-25 DIAGNOSIS — M79671 Pain in right foot: Secondary | ICD-10-CM | POA: Diagnosis not present

## 2024-01-26 ENCOUNTER — Ambulatory Visit: Admitting: Podiatry

## 2024-01-27 DIAGNOSIS — M25571 Pain in right ankle and joints of right foot: Secondary | ICD-10-CM | POA: Diagnosis not present

## 2024-01-27 DIAGNOSIS — M79672 Pain in left foot: Secondary | ICD-10-CM | POA: Diagnosis not present

## 2024-01-27 DIAGNOSIS — M79671 Pain in right foot: Secondary | ICD-10-CM | POA: Diagnosis not present

## 2024-02-01 DIAGNOSIS — M79671 Pain in right foot: Secondary | ICD-10-CM | POA: Diagnosis not present

## 2024-02-01 DIAGNOSIS — M79672 Pain in left foot: Secondary | ICD-10-CM | POA: Diagnosis not present

## 2024-02-01 DIAGNOSIS — M25571 Pain in right ankle and joints of right foot: Secondary | ICD-10-CM | POA: Diagnosis not present

## 2024-02-03 DIAGNOSIS — M79671 Pain in right foot: Secondary | ICD-10-CM | POA: Diagnosis not present

## 2024-02-03 DIAGNOSIS — M79672 Pain in left foot: Secondary | ICD-10-CM | POA: Diagnosis not present

## 2024-02-03 DIAGNOSIS — M25571 Pain in right ankle and joints of right foot: Secondary | ICD-10-CM | POA: Diagnosis not present

## 2024-02-10 DIAGNOSIS — M79672 Pain in left foot: Secondary | ICD-10-CM | POA: Diagnosis not present

## 2024-02-10 DIAGNOSIS — M25571 Pain in right ankle and joints of right foot: Secondary | ICD-10-CM | POA: Diagnosis not present

## 2024-02-10 DIAGNOSIS — M79671 Pain in right foot: Secondary | ICD-10-CM | POA: Diagnosis not present

## 2024-02-15 DIAGNOSIS — M79671 Pain in right foot: Secondary | ICD-10-CM | POA: Diagnosis not present

## 2024-02-15 DIAGNOSIS — M79672 Pain in left foot: Secondary | ICD-10-CM | POA: Diagnosis not present

## 2024-02-15 DIAGNOSIS — M25571 Pain in right ankle and joints of right foot: Secondary | ICD-10-CM | POA: Diagnosis not present

## 2024-02-16 ENCOUNTER — Ambulatory Visit (INDEPENDENT_AMBULATORY_CARE_PROVIDER_SITE_OTHER): Admitting: Podiatry

## 2024-02-16 DIAGNOSIS — M722 Plantar fascial fibromatosis: Secondary | ICD-10-CM

## 2024-02-16 DIAGNOSIS — S93491A Sprain of other ligament of right ankle, initial encounter: Secondary | ICD-10-CM

## 2024-02-16 DIAGNOSIS — S93691D Other sprain of right foot, subsequent encounter: Secondary | ICD-10-CM | POA: Diagnosis not present

## 2024-02-16 NOTE — Progress Notes (Signed)
 She presents today after having seen Dr. Malvin Dr. Tobie.  She states that this plantar fasciitis is just not getting any better and my ankle sprain is still hurting very badly.  She states that is affecting her ability perform her daily activities.  She states that is currently she is in physical therapy and it just does not seem to be improving at all.  She has had multiple injections steroid oral nonsteroidal anti-inflammatories physical therapy plantar fascia brace and orthotics.  All conservative therapies have failed to improve her symptoms.  Objective: Vital signs stable alert oriented x 3 she has pain on palpation anterior talofibular ligament which appears to be intact.  She has no anterior drawer sign.  She still has severe pain on palpation medial calcaneal tubercle of the right heel with what appears to be a loss of tension in the medial band of the plantar fascia right foot as opposed to the left.  Assessment: Plantar fasciitis bilateral probable tear of the plantar fascia right possible tear of the anterior talofibular ligament right.  Plan: Currently we are requesting MRI of the right rear foot to follow-up and for surgical consideration.

## 2024-02-17 ENCOUNTER — Encounter: Payer: Self-pay | Admitting: Podiatry

## 2024-02-17 DIAGNOSIS — M79672 Pain in left foot: Secondary | ICD-10-CM | POA: Diagnosis not present

## 2024-02-17 DIAGNOSIS — M79671 Pain in right foot: Secondary | ICD-10-CM | POA: Diagnosis not present

## 2024-02-17 DIAGNOSIS — M25571 Pain in right ankle and joints of right foot: Secondary | ICD-10-CM | POA: Diagnosis not present

## 2024-02-24 ENCOUNTER — Other Ambulatory Visit

## 2024-02-28 ENCOUNTER — Ambulatory Visit
Admission: RE | Admit: 2024-02-28 | Discharge: 2024-02-28 | Disposition: A | Discharge status: Home / Self Care | Source: Ambulatory Visit | Attending: Podiatry | Admitting: Podiatry

## 2024-02-28 DIAGNOSIS — M722 Plantar fascial fibromatosis: Secondary | ICD-10-CM

## 2024-02-28 DIAGNOSIS — S93491A Sprain of other ligament of right ankle, initial encounter: Secondary | ICD-10-CM

## 2024-02-29 ENCOUNTER — Telehealth: Payer: Self-pay | Admitting: Podiatry

## 2024-02-29 NOTE — Telephone Encounter (Signed)
 Patient states her MRI results came back.She asks does she need an appointment to discuss MRI results or will you call and discuss over the phone?

## 2024-03-07 ENCOUNTER — Ambulatory Visit: Payer: Self-pay | Admitting: Podiatry

## 2024-03-09 ENCOUNTER — Ambulatory Visit (INDEPENDENT_AMBULATORY_CARE_PROVIDER_SITE_OTHER): Admitting: Podiatry

## 2024-03-09 ENCOUNTER — Encounter: Payer: Self-pay | Admitting: Podiatry

## 2024-03-09 DIAGNOSIS — S93691D Other sprain of right foot, subsequent encounter: Secondary | ICD-10-CM

## 2024-03-13 NOTE — Progress Notes (Signed)
 She presents today complaining of moderate to severe pain to the right heel.  She states that it really has not changed if anything is worsened.  Objective: Vital signs are stable alert oriented x 3.  She still has significant pain on palpation medial calcaneal tubercle of the right heel.  MRI reading does suggest a likely partial tear of the medial band of the plantar fascia near its origin.  She also has some early osteoarthritic changes at the talonavicular joint  Assessment: Plantar fasciitis with partial plantar fascial tear right foot.  Plan: Discussed etiology pathology conservative versus surgical therapies at this point we discussed an endoscopic plantar fasciotomy with PRP injection.  She understands this and is amenable to it.  We did discuss the possible complications and side effects associated with this she understands and is amenable to it understands that she may be out of work for a while with this.  Understands that she may develop plantar fasciitis in the other parts of the fascia as well.  She signed the consent form today and we will have this done as soon as possible at [facility] surgical center.

## 2024-03-31 ENCOUNTER — Encounter: Payer: Self-pay | Admitting: Family Medicine

## 2024-04-03 NOTE — Telephone Encounter (Signed)
Form printed and placed in PCP office to be reviewed

## 2024-04-04 ENCOUNTER — Encounter: Payer: Self-pay | Admitting: Podiatry

## 2024-04-04 NOTE — Telephone Encounter (Signed)
 Sent MyChart mess to patient about form process

## 2024-04-06 DIAGNOSIS — Z0279 Encounter for issue of other medical certificate: Secondary | ICD-10-CM

## 2024-04-07 ENCOUNTER — Ambulatory Visit: Admitting: Family Medicine

## 2024-04-07 ENCOUNTER — Telehealth: Payer: Self-pay

## 2024-04-07 NOTE — Telephone Encounter (Signed)
 Patient called and left a message on the nurse line - she is returning our call. thanks

## 2024-04-07 NOTE — Telephone Encounter (Signed)
 cld not leave mess on vmail for pt, her mailbox is full. DOS 05/13/23 RTW 07/24/24 approx. Via MyChart I adv pt will fax [employer] [phone]

## 2024-04-10 ENCOUNTER — Telehealth: Payer: Self-pay | Admitting: Podiatry

## 2024-04-10 NOTE — Telephone Encounter (Signed)
 Patient scheduled for surgery on 05/12/2024. Patient not on any GLP1 or blood thinners. Patient pharmacy correct in chart. Patient to have new insurance in 2026.

## 2024-04-11 ENCOUNTER — Encounter: Payer: Self-pay | Admitting: Family Medicine

## 2024-04-11 NOTE — Telephone Encounter (Signed)
 Form faxed to [phone]

## 2024-04-11 NOTE — Telephone Encounter (Signed)
 Faxed [employer] form again to [phone]-no charge  Pt sent via MyChart and I let her know it was faxed

## 2024-04-24 ENCOUNTER — Telehealth: Payer: Self-pay | Admitting: Lab

## 2024-04-24 NOTE — Telephone Encounter (Signed)
 Called and informed patient that surgery is at [facility]. Provided facility information and explained they are not affiliated with [facility] therefore will not show in  MyChart.

## 2024-04-24 NOTE — Telephone Encounter (Signed)
 Patient is calling stating is scheduled for surgery and it is not showing in her chart also has questions on where it will take place has concerns please contact patient.

## 2024-05-04 ENCOUNTER — Telehealth: Payer: Self-pay | Admitting: Podiatry

## 2024-05-04 NOTE — Telephone Encounter (Signed)
 DOS- 05/12/2024  ENDOSCOPIC PLANTAR FASCIOTOMY RT- 70106  ANTHEM VA EFFECTIVE DATE- 04/20/2024  DEDUCTIBLE- $2200 REMAINING- $2200 OOP- $6550 REMAINING- $6550 COINSURANCE- 0%  PER AVAILITY PORTAL, PRIOR AUTH IS NOT REQUIRED FOR CPT CODE 70106. REF# [id number]

## 2024-05-11 ENCOUNTER — Other Ambulatory Visit: Payer: Self-pay | Admitting: Podiatry

## 2024-05-11 MED ORDER — OXYCODONE-ACETAMINOPHEN 10-325 MG PO TABS: 1.0000 | ORAL_TABLET | Freq: Three times a day (TID) | ORAL | 0 refills | Status: AC | PRN

## 2024-05-11 MED ORDER — CLINDAMYCIN HCL 150 MG PO CAPS: 150.0000 mg | ORAL_CAPSULE | Freq: Three times a day (TID) | ORAL | 0 refills | Status: AC

## 2024-05-11 MED ORDER — ONDANSETRON HCL 4 MG PO TABS: 4.0000 mg | ORAL_TABLET | Freq: Three times a day (TID) | ORAL | 0 refills | Status: AC | PRN

## 2024-05-18 ENCOUNTER — Encounter

## 2024-05-18 ENCOUNTER — Ambulatory Visit: Admitting: Podiatry

## 2024-05-18 VITALS — Ht 65.0 in | Wt 266.0 lb

## 2024-05-18 DIAGNOSIS — M722 Plantar fascial fibromatosis: Secondary | ICD-10-CM

## 2024-05-18 NOTE — Progress Notes (Signed)
"  °  Subjective:  Patient ID: Tammy Knight, female    DOB: 09-04-1990,  MRN: [id number]  Chief Complaint  Patient presents with   Post-op Follow-up    RM 10 POV #1 DOS 05/12/2024 RT. Surgical site is healing well, all sutures are intact with no abnormal swelling or signs of infection. Pt would like to discuss weightbearing.    34 y.o. female returns for post-op check following right EPF with Dr. Verta.  Overall doing well  Review of Systems: Negative except as noted in the HPI. Denies N/V/F/Ch.   Objective:  There were no vitals filed for this visit. Body mass index is 44.26 kg/m. Constitutional Well developed. Well nourished.  Vascular Foot warm and well perfused. Capillary refill normal to all digits.  Calf is soft and supple, no posterior calf or knee pain, negative Homans' sign  Neurologic Normal speech. Oriented to person, place, and time. Epicritic sensation to light touch grossly present bilaterally.  Dermatologic Skin healing well without signs of infection. Skin edges well coapted without signs of infection.  Orthopedic: Very little pain to palpation noted about the surgical site.   Assessment:   1. Plantar fasciitis, right    Plan:  Patient was evaluated and treated and all questions answered.  S/p foot surgery right -Progressing as expected post-operatively.  Return in 2 weeks to remove sutures.  Band-Aids applied.  Okay to shower.  Follow-up in 2 weeks as scheduled for suture removal.  Weightbearing as tolerated in boot.  Okay to remove boot for range of motion.  Would continue to sleep in boot for now  No follow-ups on file.  "

## 2024-06-01 ENCOUNTER — Encounter: Admitting: Podiatry

## 2024-06-01 ENCOUNTER — Encounter

## 2024-06-15 ENCOUNTER — Encounter: Admitting: Podiatry
